# Patient Record
Sex: Female | Born: 1950
Health system: Southern US, Community
[De-identification: ages and names within clinical notes are randomized; demographics above are authoritative.]

## PROBLEM LIST (undated history)

## (undated) DIAGNOSIS — I10 Essential (primary) hypertension: Secondary | ICD-10-CM

## (undated) DIAGNOSIS — E119 Type 2 diabetes mellitus without complications: Secondary | ICD-10-CM

## (undated) HISTORY — PX: TUBAL LIGATION: SHX77

## (undated) HISTORY — PX: ABDOMINAL HYSTERECTOMY: SHX81

---

## 1998-09-29 ENCOUNTER — Ambulatory Visit (HOSPITAL_COMMUNITY): Admission: RE | Admit: 1998-09-29 | Discharge: 1998-09-29 | Payer: Self-pay | Admitting: Neurosurgery

## 1998-09-29 ENCOUNTER — Encounter: Payer: Self-pay | Admitting: Neurosurgery

## 1998-10-13 ENCOUNTER — Ambulatory Visit (HOSPITAL_COMMUNITY): Admission: RE | Admit: 1998-10-13 | Discharge: 1998-10-13 | Payer: Self-pay | Admitting: Neurosurgery

## 1998-10-13 ENCOUNTER — Encounter: Payer: Self-pay | Admitting: Neurosurgery

## 1998-10-27 ENCOUNTER — Encounter: Payer: Self-pay | Admitting: Neurosurgery

## 1998-10-27 ENCOUNTER — Ambulatory Visit (HOSPITAL_COMMUNITY): Admission: RE | Admit: 1998-10-27 | Discharge: 1998-10-27 | Payer: Self-pay | Admitting: Neurosurgery

## 2006-05-24 ENCOUNTER — Ambulatory Visit: Payer: Self-pay | Admitting: Family Medicine

## 2006-08-27 ENCOUNTER — Ambulatory Visit: Payer: Self-pay | Admitting: Family Medicine

## 2010-09-03 ENCOUNTER — Encounter: Payer: Self-pay | Admitting: *Deleted

## 2010-09-03 ENCOUNTER — Encounter: Payer: Self-pay | Admitting: Family Medicine

## 2011-09-17 ENCOUNTER — Ambulatory Visit
Admission: RE | Admit: 2011-09-17 | Discharge: 2011-09-17 | Disposition: A | Discharge status: Home / Self Care | Payer: BC Managed Care – PPO | Source: Ambulatory Visit | Attending: Family Medicine | Admitting: Family Medicine

## 2011-09-17 ENCOUNTER — Other Ambulatory Visit: Payer: Self-pay | Admitting: Family Medicine

## 2011-09-17 DIAGNOSIS — R1031 Right lower quadrant pain: Secondary | ICD-10-CM

## 2011-09-17 MED ORDER — IOHEXOL 300 MG/ML  SOLN
125.0000 mL | Freq: Once | INTRAMUSCULAR | Status: AC | PRN
  Administered 2011-09-17: 125 mL via INTRAVENOUS

## 2013-02-25 ENCOUNTER — Encounter (HOSPITAL_COMMUNITY): Payer: Self-pay | Admitting: *Deleted

## 2013-02-25 ENCOUNTER — Emergency Department (HOSPITAL_COMMUNITY)
Admission: EM | Admit: 2013-02-25 | Discharge: 2013-02-26 | Disposition: A | Discharge status: Home / Self Care | Payer: BC Managed Care – PPO | Attending: Emergency Medicine | Admitting: Emergency Medicine

## 2013-02-25 DIAGNOSIS — IMO0002 Reserved for concepts with insufficient information to code with codable children: Secondary | ICD-10-CM

## 2013-02-25 DIAGNOSIS — M79609 Pain in unspecified limb: Secondary | ICD-10-CM | POA: Insufficient documentation

## 2013-02-25 DIAGNOSIS — I10 Essential (primary) hypertension: Secondary | ICD-10-CM | POA: Insufficient documentation

## 2013-02-25 DIAGNOSIS — Z87891 Personal history of nicotine dependence: Secondary | ICD-10-CM | POA: Insufficient documentation

## 2013-02-25 DIAGNOSIS — E119 Type 2 diabetes mellitus without complications: Secondary | ICD-10-CM | POA: Insufficient documentation

## 2013-02-25 DIAGNOSIS — Z79899 Other long term (current) drug therapy: Secondary | ICD-10-CM | POA: Insufficient documentation

## 2013-02-25 HISTORY — DX: Type 2 diabetes mellitus without complications: E11.9

## 2013-02-25 HISTORY — DX: Essential (primary) hypertension: I10

## 2013-02-25 MED ORDER — OXYCODONE-ACETAMINOPHEN 5-325 MG PO TABS
2.0000 | ORAL_TABLET | Freq: Once | ORAL | Status: AC
  Administered 2013-02-26: 1 via ORAL
  Filled 2013-02-25: qty 2

## 2013-02-25 MED ORDER — ONDANSETRON 8 MG PO TBDP
8.0000 mg | ORAL_TABLET | Freq: Once | ORAL | Status: AC
  Administered 2013-02-26: 8 mg via ORAL
  Filled 2013-02-25: qty 1

## 2013-02-25 NOTE — ED Provider Notes (Signed)
History    This chart was scribed for a non-physician practitioner working with Lyanne Co, MD by Jiles Prows, ED scribe. This patient was seen in room WTR6/WTR6 and the patient's care was started at 11:39 PM.  CSN: [id number] Arrival date & time 02/25/13  2249   Chief Complaint  Patient presents with  . Nail Problem   The history is provided by the patient and medical records. No language interpreter was used.   HPI Comments: Catherine Baker is a 62 y.o. female with a h/o HTN and DM who presents to the Emergency Department complaining of moderate to severe throbbing, shooting pain to great toe nail beds bilaterally onset 2 days ago.  She rated this pain currently at a 10/10.  Pt reports that about 3 weeks ago she had acrylic nails put on her toenails.  She was aware of pain to her great toes bilaterally, so she had the acrylic nails removed yesterday.  The nail salon gave  Her antifungal treatment for home use.  Pt reports there was pus coming out of great toes bilaterally yesterday.  Pt reports severe pain to right great toe today.  She explains a part of the flesh came off with bleeding that is now controlled.  She reports that the left great toe nail was caught on her sock and pulled partially off.  Pt denies headache, diaphoresis, fever, chills, nausea, vomiting, diarrhea, weakness, cough, SOB and any other pain.   Past Medical History  Diagnosis Date  . Hypertension   . Diabetes mellitus without complication    Past Surgical History  Procedure Laterality Date  . Abdominal hysterectomy    . Tubal ligation     No family history on file. History  Substance Use Topics  . Smoking status: Former Games developer  . Smokeless tobacco: Never Used  . Alcohol Use: Yes     Comment: occasional   OB History   Grav Para Term Preterm Abortions TAB SAB Ect Mult Living                 Review of Systems  Constitutional: Negative for fever and chills.  Gastrointestinal: Negative for nausea,  vomiting and abdominal pain.  Skin: Positive for wound.  All other systems reviewed and are negative.    Allergies  Codeine  Home Medications   Current Outpatient Rx  Name  Route  Sig  Dispense  Refill  . amLODipine (NORVASC) 5 MG tablet   Oral   Take 5 mg by mouth daily.         Marland Kitchen b complex vitamins tablet   Oral   Take 1 tablet by mouth daily.         Marland Kitchen levothyroxine (LEVOTHROID) 75 MCG tablet   Oral   Take 75 mcg by mouth daily before breakfast.         . metFORMIN (GLUCOPHAGE) 500 MG tablet   Oral   Take 500 mg by mouth 2 (two) times daily with a meal.         . omega-3 acid ethyl esters (LOVAZA) 1 G capsule   Oral   Take 1 g by mouth daily.         . rosuvastatin (CRESTOR) 10 MG tablet   Oral   Take 10 mg by mouth daily.         Marland Kitchen telmisartan-hydrochlorothiazide (MICARDIS HCT) 80-25 MG per tablet   Oral   Take 1 tablet by mouth daily.         Marland Kitchen  ondansetron (ZOFRAN) 4 MG tablet   Oral   Take 1 tablet (4 mg total) by mouth every 6 (six) hours.   12 tablet   0   . oxyCODONE-acetaminophen (PERCOCET/ROXICET) 5-325 MG per tablet   Oral   Take 1 tablet by mouth every 6 (six) hours as needed for pain. May take 2 tablets PO q 6 hours for severe pain - Do not take with Tylenol as this tablet already contains tylenol   15 tablet   0   . sulfamethoxazole-trimethoprim (SEPTRA DS) 800-160 MG per tablet   Oral   Take 1 tablet by mouth every 12 (twelve) hours.   20 tablet   0    BP 134/69  Pulse 58  Temp(Src) 98.3 F (36.8 C) (Oral)  Resp 20  Ht 5\' 7"  (1.702 m)  Wt 248 lb (112.492 kg)  BMI 38.83 kg/m2  SpO2 100% Physical Exam  Nursing note and vitals reviewed. Constitutional: She is oriented to person, place, and time. She appears well-developed and well-nourished. No distress.  HENT:  Head: Normocephalic and atraumatic.  Right Ear: External ear normal.  Left Ear: External ear normal.  Nose: Nose normal.  Mouth/Throat: Oropharynx is  clear and moist.  Eyes: Conjunctivae are normal.  Neck: Normal range of motion.  Cardiovascular: Normal rate, regular rhythm and normal heart sounds.   Pulmonary/Chest: Effort normal and breath sounds normal. No stridor. No respiratory distress. She has no wheezes. She has no rales.  Abdominal: Soft. She exhibits no distension.  Musculoskeletal: Normal range of motion.  Neurological: She is alert and oriented to person, place, and time. She has normal strength.  Skin: Skin is warm and dry. She is not diaphoretic. No erythema.  Left great toenail very loose.  Clear discharge.  Surrounding erythema.  Toenail on right first toe loose.  1 cm chunk missing from lateral aspect.    Neurovascularly intact.    Psychiatric: She has a normal mood and affect. Her behavior is normal.    ED Course  Procedures (including critical care time) DIAGNOSTIC STUDIES: Oxygen Saturation is 100% on RA, normal by my interpretation.    COORDINATION OF CARE: 11:45 PM - Discussed ED treatment with pt at bedside including percocet for pain management, warm water soaks, antibiotics, follow up with PCP, and great toenail removal and pt agrees.   11:56 PM - Consult with Dr. Patria Mane.  He agreed with plan.  12:10 AM - Will write work note for pt.  12:15 AM - LEFT GREAT TOE NAIL REMOVAL Performed by: Junious Silk, PA-C Authorized by: Lyanne Co, MD Consent: Verbal consent obtained. Consent given by: patient Location: left great toe nail Anesthetic: 2% lidocaine Anesthetic total: 2 mL Technique: digital block Nail Removed: Left great toe nail Post-removal: sterile dressing Patient tolerance: Patient tolerated the procedure well with no immediate complications.  12:38 AM - Evidence of infection under left great toe nail.  Consult with Dr. Patria Mane.  Decided to remove right great toe nail also.  12:41 AM - RIGHT GREAT TOE NAIL REMOVAL Performed by: Junious Silk, PA-C Authorized by: Lyanne Co,  MD Consent: Verbal consent obtained. Consent given by: patient Location: right great toe nail Anesthetic: 2% lidocaine Anesthetic total: 2 mL Technique: digital block Nail Removed: right great Post-removal: sterile dressing Patient tolerance: Patient tolerated the procedure well with no immediate complications.    Labs Reviewed  GLUCOSE, CAPILLARY - Abnormal; Notable for the following:    Glucose-Capillary 106 (*)    All other  components within normal limits   No results found. 1. Infection, nail     MDM  Patient is diabetic with infection of her 1st toes bilaterally. Toenails removed bilaterally. Given rx for bactrim and percocet. Follow up with her PCP and/or podiatrist. Warm water soaks. Discussed importance of taking abx since she is a diabetic. Return instructions given. Vital signs stable for discharge. Dr. Patria Mane evaluated patient and agrees with plan.  Patient / Family / Caregiver informed of clinical course, understand medical decision-making process, and agree with plan.   I personally performed the services described in this documentation, which was scribed in my presence. The recorded information has been reviewed and is accurate.     Mora Bellman, PA-C 02/26/13 [phone]

## 2013-02-25 NOTE — ED Notes (Signed)
Pt reports she had acrylic nails put on both big toes three weeks ago- states she began to notice pus from both nail beds yesterday- left nail is lifting from nail bed

## 2013-02-26 LAB — GLUCOSE, CAPILLARY: Glucose-Capillary: 106 mg/dL — ABNORMAL HIGH (ref 70–99)

## 2013-02-26 MED ORDER — SULFAMETHOXAZOLE-TRIMETHOPRIM 800-160 MG PO TABS: 1.0000 | ORAL_TABLET | Freq: Two times a day (BID) | ORAL | Status: DC

## 2013-02-26 MED ORDER — OXYCODONE-ACETAMINOPHEN 5-325 MG PO TABS: 1.0000 | ORAL_TABLET | Freq: Four times a day (QID) | ORAL | Status: DC | PRN

## 2013-02-26 MED ORDER — ONDANSETRON HCL 4 MG PO TABS: 4.0000 mg | ORAL_TABLET | Freq: Four times a day (QID) | ORAL | Status: DC

## 2013-02-26 NOTE — ED Notes (Signed)
D/c home with ride- rx x 3 given for percocet, zofran and septra

## 2013-02-26 NOTE — ED Notes (Signed)
PA at bedside for toenail removal

## 2013-02-26 NOTE — ED Provider Notes (Signed)
Medical screening examination/treatment/procedure(s) were conducted as a shared visit with non-physician practitioner(s) and myself.  I personally evaluated the patient during the encounter  i was present for portions of the procedures.  Home with antibiotics and PCP followup we have also recommended podiatry followup.  Have recommended that she do warm water mild soap soaks several times a day for the next week.  She understands to return to the ER for new or worsening symptoms  Lyanne Co, MD 02/26/13 [phone]

## 2013-09-07 ENCOUNTER — Other Ambulatory Visit: Payer: Self-pay | Admitting: Family Medicine

## 2013-09-07 ENCOUNTER — Ambulatory Visit
Admission: RE | Admit: 2013-09-07 | Discharge: 2013-09-07 | Disposition: A | Discharge status: Home / Self Care | Payer: BC Managed Care – PPO | Source: Ambulatory Visit | Attending: Family Medicine | Admitting: Family Medicine

## 2013-09-07 DIAGNOSIS — R059 Cough, unspecified: Secondary | ICD-10-CM

## 2013-09-07 DIAGNOSIS — R05 Cough: Secondary | ICD-10-CM

## 2013-09-12 ENCOUNTER — Emergency Department (HOSPITAL_COMMUNITY): Payer: BC Managed Care – PPO

## 2013-09-12 ENCOUNTER — Encounter (HOSPITAL_COMMUNITY): Payer: Self-pay | Admitting: Emergency Medicine

## 2013-09-12 ENCOUNTER — Emergency Department (HOSPITAL_COMMUNITY)
Admission: EM | Admit: 2013-09-12 | Discharge: 2013-09-12 | Disposition: A | Discharge status: Home / Self Care | Payer: BC Managed Care – PPO | Attending: Emergency Medicine | Admitting: Emergency Medicine

## 2013-09-12 DIAGNOSIS — Z79899 Other long term (current) drug therapy: Secondary | ICD-10-CM | POA: Insufficient documentation

## 2013-09-12 DIAGNOSIS — S139XXA Sprain of joints and ligaments of unspecified parts of neck, initial encounter: Secondary | ICD-10-CM | POA: Insufficient documentation

## 2013-09-12 DIAGNOSIS — I10 Essential (primary) hypertension: Secondary | ICD-10-CM | POA: Insufficient documentation

## 2013-09-12 DIAGNOSIS — S161XXA Strain of muscle, fascia and tendon at neck level, initial encounter: Secondary | ICD-10-CM

## 2013-09-12 DIAGNOSIS — Y9389 Activity, other specified: Secondary | ICD-10-CM | POA: Insufficient documentation

## 2013-09-12 DIAGNOSIS — Y92410 Unspecified street and highway as the place of occurrence of the external cause: Secondary | ICD-10-CM | POA: Insufficient documentation

## 2013-09-12 DIAGNOSIS — R110 Nausea: Secondary | ICD-10-CM | POA: Insufficient documentation

## 2013-09-12 DIAGNOSIS — E119 Type 2 diabetes mellitus without complications: Secondary | ICD-10-CM | POA: Insufficient documentation

## 2013-09-12 DIAGNOSIS — Z87891 Personal history of nicotine dependence: Secondary | ICD-10-CM | POA: Insufficient documentation

## 2013-09-12 DIAGNOSIS — S0990XA Unspecified injury of head, initial encounter: Secondary | ICD-10-CM | POA: Insufficient documentation

## 2013-09-12 MED ORDER — ONDANSETRON 4 MG PO TBDP
4.0000 mg | ORAL_TABLET | Freq: Once | ORAL | Status: AC
  Administered 2013-09-12: 4 mg via ORAL
  Filled 2013-09-12: qty 1

## 2013-09-12 MED ORDER — CYCLOBENZAPRINE HCL 10 MG PO TABS
10.0000 mg | ORAL_TABLET | Freq: Once | ORAL | Status: AC
  Administered 2013-09-12: 10 mg via ORAL
  Filled 2013-09-12: qty 1

## 2013-09-12 MED ORDER — ONDANSETRON 4 MG PO TBDP: 4.0000 mg | ORAL_TABLET | Freq: Three times a day (TID) | ORAL | Status: DC | PRN

## 2013-09-12 MED ORDER — KETOROLAC TROMETHAMINE 60 MG/2ML IM SOLN
60.0000 mg | Freq: Once | INTRAMUSCULAR | Status: AC
  Administered 2013-09-12: 60 mg via INTRAMUSCULAR
  Filled 2013-09-12: qty 2

## 2013-09-12 MED ORDER — CYCLOBENZAPRINE HCL 10 MG PO TABS: 10.0000 mg | ORAL_TABLET | Freq: Three times a day (TID) | ORAL | Status: DC | PRN

## 2013-09-12 MED ORDER — OXYCODONE-ACETAMINOPHEN 5-325 MG PO TABS: 1.0000 | ORAL_TABLET | Freq: Four times a day (QID) | ORAL | Status: DC | PRN
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
## 2013-09-12 NOTE — ED Notes (Signed)
Patient returned from Xray and CT. 

## 2013-09-12 NOTE — Discharge Instructions (Signed)
Cervical Sprain °A cervical sprain is an injury in the neck in which the strong, fibrous tissues (ligaments) that connect your neck bones stretch or tear. Cervical sprains can range from mild to severe. Severe cervical sprains can cause the neck vertebrae to be unstable. This can lead to damage of the spinal cord and can result in serious nervous system problems. The amount of time it takes for a cervical sprain to get better depends on the cause and extent of the injury. Most cervical sprains heal in 1 to 3 weeks. °CAUSES  °Severe cervical sprains may be caused by:  °· Contact sport injuries (such as from football, rugby, wrestling, hockey, auto racing, gymnastics, diving, martial arts, or boxing).   °· Motor vehicle collisions.   °· Whiplash injuries. This is an injury from a sudden forward-and backward whipping movement of the head and neck.  °· Falls.   °Mild cervical sprains may be caused by:  °· Being in an awkward position, such as while cradling a telephone between your ear and shoulder.   °· Sitting in a chair that does not offer proper support.   °· Working at a poorly designed computer station.   °· Looking up or down for long periods of time.   °SYMPTOMS  °· Pain, soreness, stiffness, or a burning sensation in the front, back, or sides of the neck. This discomfort may develop immediately after the injury or slowly, 24 hours or more after the injury.   °· Pain or tenderness directly in the middle of the back of the neck.   °· Shoulder or upper back pain.   °· Limited ability to move the neck.   °· Headache.   °· Dizziness.   °· Weakness, numbness, or tingling in the hands or arms.   °· Muscle spasms.   °· Difficulty swallowing or chewing.   °· Tenderness and swelling of the neck.   °DIAGNOSIS  °Most of the time your health care provider can diagnose a cervical sprain by taking your history and doing a physical exam. Your health care provider will ask about previous neck injuries and any known neck  problems, such as arthritis in the neck. X-rays may be taken to find out if there are any other problems, such as with the bones of the neck. Other tests, such as a CT scan or MRI, may also be needed.  °TREATMENT  °Treatment depends on the severity of the cervical sprain. Mild sprains can be treated with rest, keeping the neck in place (immobilization), and pain medicines. Severe cervical sprains are immediately immobilized. Further treatment is done to help with pain, muscle spasms, and other symptoms and may include: °· Medicines, such as pain relievers, numbing medicines, or muscle relaxants.   °· Physical therapy. This may involve stretching exercises, strengthening exercises, and posture training. Exercises and improved posture can help stabilize the neck, strengthen muscles, and help stop symptoms from returning.   °HOME CARE INSTRUCTIONS  °· Put ice on the injured area.   °· Put ice in a plastic bag.   °· Place a towel between your skin and the bag.   °· Leave the ice on for 15 20 minutes, 3 4 times a day.   °· If your injury was severe, you may have been given a cervical collar to wear. A cervical collar is a two-piece collar designed to keep your neck from moving while it heals. °· Do not remove the collar unless instructed by your health care provider. °· If you have long hair, keep it outside of the collar. °· Ask your health care provider before making any adjustments to your collar.   Minor adjustments may be required over time to improve comfort and reduce pressure on your chin or on the back of your head.  Ifyou are allowed to remove the collar for cleaning or bathing, follow your health care provider's instructions on how to do so safely.  Keep your collar clean by wiping it with mild soap and water and drying it completely. If the collar you have been given includes removable pads, remove them every 1 2 days and hand wash them with soap and water. Allow them to air dry. They should be completely  dry before you wear them in the collar.  If you are allowed to remove the collar for cleaning and bathing, wash and dry the skin of your neck. Check your skin for irritation or sores. If you see any, tell your health care provider.  Do not drive while wearing the collar.   Only take over-the-counter or prescription medicines for pain, discomfort, or fever as directed by your health care provider.   Keep all follow-up appointments as directed by your health care provider.   Keep all physical therapy appointments as directed by your health care provider.   Make any needed adjustments to your workstation to promote good posture.   Avoid positions and activities that make your symptoms worse.   Warm up and stretch before being active to help prevent problems.  SEEK MEDICAL CARE IF:   Your pain is not controlled with medicine.   You are unable to decrease your pain medicine over time as planned.   Your activity level is not improving as expected.  SEEK IMMEDIATE MEDICAL CARE IF:   You develop any bleeding.  You develop stomach upset.  You have signs of an allergic reaction to your medicine.   Your symptoms get worse.   You develop new, unexplained symptoms.   You have numbness, tingling, weakness, or paralysis in any part of your body.  MAKE SURE YOU:   Understand these instructions.  Will watch your condition.  Will get help right away if you are not doing well or get worse. Document Released: 05/27/2007 Document Revised: 05/20/2013 Document Reviewed: 02/04/2013 [facility] Patient Information 2014 [place], [place].  Ligament Sprain Ligaments are tough, fibrous tissues that hold bones together at the joints. A sprain can occur when a ligament is stretched. This injury may take several weeks to heal. HOME CARE INSTRUCTIONS   Rest the injured area for as long as directed by your caregiver. Then slowly start using the joint as directed by your caregiver and as  the pain allows.  Keep the affected joint raised if possible to lessen swelling.  Apply ice for 15-20 minutes to the injured area every couple hours for the first half day, then 03-04 times per day for the first 48 hours. Put the ice in a plastic bag and place a towel between the bag of ice and your skin.  Wear any splinting, casting, or elastic bandage applications as instructed.  Only take over-the-counter or prescription medicines for pain, discomfort, or fever as directed by your caregiver. Do not use aspirin immediately after the injury unless instructed by your caregiver. Aspirin can cause increased bleeding and bruising of the tissues.  If you were given crutches, continue to use them as instructed and do not resume weight bearing on the affected extremity until instructed. SEEK MEDICAL CARE IF:   Your bruising, swelling, or pain increases.  You have cold and numb fingers or toes if your arm or leg was injured.  SEEK IMMEDIATE MEDICAL CARE IF:   Your toes are numb or blue if your leg was injured.  Your fingers are numb or blue if your arm was injured.  Your pain is not responding to medicines and continues to stay the same or gets worse. MAKE SURE YOU:   Understand these instructions.  Will watch your condition.  Will get help right away if you are not doing well or get worse. Document Released: 07/27/2000 Document Revised: 10/22/2011 Document Reviewed: 05/25/2008 [facility] Patient Information 2014 [place], [place].  Muscle Strain A muscle strain is an injury that occurs when a muscle is stretched beyond its normal length. Usually a small number of muscle fibers are torn when this happens. Muscle strain is rated in degrees. First-degree strains have the least amount of muscle fiber tearing and pain. Second-degree and third-degree strains have increasingly more tearing and pain.  Usually, recovery from muscle strain takes 1 2 weeks. Complete healing takes 5 6 weeks.  CAUSES    Muscle strain happens when a sudden, violent force placed on a muscle stretches it too far. This may occur with lifting, sports, or a fall.  RISK FACTORS Muscle strain is especially common in athletes.  SIGNS AND SYMPTOMS At the site of the muscle strain, there may be:  Pain.  Bruising.  Swelling.  Difficulty using the muscle due to pain or lack of normal function. DIAGNOSIS  Your health care provider will perform a physical exam and ask about your medical history. TREATMENT  Often, the best treatment for a muscle strain is resting, icing, and applying cold compresses to the injured area.  HOME CARE INSTRUCTIONS   Use the PRICE method of treatment to promote muscle healing during the first 2 3 days after your injury. The PRICE method involves:  Protecting the muscle from being injured again.  Restricting your activity and resting the injured body part.  Icing your injury. To do this, put ice in a plastic bag. Place a towel between your skin and the bag. Then, apply the ice and leave it on from 15 20 minutes each hour. After the third day, switch to moist heat packs.  Apply compression to the injured area with a splint or elastic bandage. Be careful not to wrap it too tightly. This may interfere with blood circulation or increase swelling.  Elevate the injured body part above the level of your heart as often as you can.  Only take over-the-counter or prescription medicines for pain, discomfort, or fever as directed by your health care provider.  Warming up prior to exercise helps to prevent future muscle strains. SEEK MEDICAL CARE IF:   You have increasing pain or swelling in the injured area.  You have numbness, tingling, or a significant loss of strength in the injured area. MAKE SURE YOU:   Understand these instructions.  Will watch your condition.  Will get help right away if you are not doing well or get worse. Document Released: 07/30/2005 Document Revised:  05/20/2013 Document Reviewed: 02/26/2013 [facility] Patient Information 2014 [place], [place].

## 2013-09-12 NOTE — ED Provider Notes (Signed)
Medical screening examination/treatment/procedure(s) were performed by non-physician practitioner and as supervising physician I was immediately available for consultation/collaboration.  EKG Interpretation   None         Ladaysha Soutar M Senovia Gauer, MD 09/12/13 [id number] 

## 2013-10-22 ENCOUNTER — Other Ambulatory Visit: Payer: Self-pay | Admitting: Family Medicine

## 2013-10-22 ENCOUNTER — Ambulatory Visit
Admission: RE | Admit: 2013-10-22 | Discharge: 2013-10-22 | Disposition: A | Discharge status: Home / Self Care | Payer: BC Managed Care – PPO | Source: Ambulatory Visit | Attending: Family Medicine | Admitting: Family Medicine

## 2013-10-22 DIAGNOSIS — M25562 Pain in left knee: Secondary | ICD-10-CM

## 2013-12-24 ENCOUNTER — Ambulatory Visit (HOSPITAL_COMMUNITY)
Admission: RE | Admit: 2013-12-24 | Discharge: 2013-12-24 | Disposition: A | Discharge status: Home / Self Care | Payer: Self-pay | Source: Ambulatory Visit | Attending: Orthopedic Surgery | Admitting: Orthopedic Surgery

## 2013-12-24 ENCOUNTER — Other Ambulatory Visit (HOSPITAL_COMMUNITY): Payer: Self-pay | Admitting: Orthopedic Surgery

## 2013-12-24 DIAGNOSIS — M7989 Other specified soft tissue disorders: Secondary | ICD-10-CM

## 2013-12-24 DIAGNOSIS — M25569 Pain in unspecified knee: Secondary | ICD-10-CM

## 2013-12-24 DIAGNOSIS — M79609 Pain in unspecified limb: Secondary | ICD-10-CM | POA: Insufficient documentation

## 2013-12-24 NOTE — Progress Notes (Signed)
*  Preliminary Results* Bilateral lower extremity venous duplex completed. Bilateral lower extremities are negative for deep vein thrombosis. There is no evidence of Baker's cyst bilaterally.  Preliminary results discussed with Dr.Graves.  12/24/2013  Gertie FeyMichelle Noriah Osgood, RVT, RDCS, RDMS

## 2014-08-10 ENCOUNTER — Telehealth (HOSPITAL_COMMUNITY): Payer: Self-pay | Admitting: *Deleted

## 2014-08-10 ENCOUNTER — Emergency Department (INDEPENDENT_AMBULATORY_CARE_PROVIDER_SITE_OTHER)
Admission: EM | Admit: 2014-08-10 | Discharge: 2014-08-10 | Disposition: A | Discharge status: Home / Self Care | Payer: BC Managed Care – PPO | Source: Home / Self Care | Attending: Family Medicine | Admitting: Family Medicine

## 2014-08-10 ENCOUNTER — Encounter (HOSPITAL_COMMUNITY): Payer: Self-pay | Admitting: Emergency Medicine

## 2014-08-10 ENCOUNTER — Ambulatory Visit (HOSPITAL_COMMUNITY): Payer: BC Managed Care – PPO | Attending: Physician Assistant

## 2014-08-10 DIAGNOSIS — R079 Chest pain, unspecified: Secondary | ICD-10-CM | POA: Diagnosis present

## 2014-08-10 DIAGNOSIS — R509 Fever, unspecified: Secondary | ICD-10-CM | POA: Diagnosis present

## 2014-08-10 DIAGNOSIS — R059 Cough, unspecified: Secondary | ICD-10-CM

## 2014-08-10 DIAGNOSIS — J320 Chronic maxillary sinusitis: Secondary | ICD-10-CM

## 2014-08-10 DIAGNOSIS — R05 Cough: Secondary | ICD-10-CM

## 2014-08-10 LAB — POCT RAPID STREP A: Streptococcus, Group A Screen (Direct): NEGATIVE

## 2014-08-10 MED ORDER — AMOXICILLIN-POT CLAVULANATE ER 1000-62.5 MG PO TB12: 2.0000 | ORAL_TABLET | Freq: Two times a day (BID) | ORAL | Status: DC

## 2014-08-10 MED ORDER — IPRATROPIUM BROMIDE 0.06 % NA SOLN: 2.0000 | Freq: Four times a day (QID) | NASAL | Status: DC

## 2014-08-10 NOTE — Discharge Instructions (Signed)
Cough, Adult  A cough is a reflex that helps clear your throat and airways. It can help heal the body or may be a reaction to an irritated airway. A cough may only last 2 or 3 weeks (acute) or may last more than 8 weeks (chronic).  CAUSES Acute cough:  Viral or bacterial infections. Chronic cough:  Infections.  Allergies.  Asthma.  Post-nasal drip.  Smoking.  Heartburn or acid reflux.  Some medicines.  Chronic lung problems (COPD).  Cancer. SYMPTOMS   Cough.  Fever.  Chest pain.  Increased breathing rate.  High-pitched whistling sound when breathing (wheezing).  Colored mucus that you cough up (sputum). TREATMENT   A bacterial cough may be treated with antibiotic medicine.  A viral cough must run its course and will not respond to antibiotics.  Your caregiver may recommend other treatments if you have a chronic cough. HOME CARE INSTRUCTIONS   Only take over-the-counter or prescription medicines for pain, discomfort, or fever as directed by your caregiver. Use cough suppressants only as directed by your caregiver.  Use a cold steam vaporizer or humidifier in your bedroom or home to help loosen secretions.  Sleep in a semi-upright position if your cough is worse at night.  Rest as needed.  Stop smoking if you smoke. SEEK IMMEDIATE MEDICAL CARE IF:   You have pus in your sputum.  Your cough starts to worsen.  You cannot control your cough with suppressants and are losing sleep.  You begin coughing up blood.  You have difficulty breathing.  You develop pain which is getting worse or is uncontrolled with medicine.  You have a fever. MAKE SURE YOU:   Understand these instructions.  Will watch your condition.  Will get help right away if you are not doing well or get worse. Document Released: 01/26/2011 Document Revised: 10/22/2011 Document Reviewed: 01/26/2011 [facility] Patient Information 2015 [place], [place]. This information is not intended  to replace advice given to you by your health care provider. Make sure you discuss any questions you have with your health care provider.  Fever, Adult A fever is a higher than normal body temperature. In an adult, an oral temperature around 98.6 F (37 C) is considered normal. A temperature of 100.4 F (38 C) or higher is generally considered a fever. Mild or moderate fevers generally have no long-term effects and often do not require treatment. Extreme fever (greater than or equal to 106 F or 41.1 C) can cause seizures. The sweating that may occur with repeated or prolonged fever may cause dehydration. Elderly people can develop confusion during a fever. A measured temperature can vary with:  Age.  Time of day.  Method of measurement (mouth, underarm, rectal, or ear). The fever is confirmed by taking a temperature with a thermometer. Temperatures can be taken different ways. Some methods are accurate and some are not.  An oral temperature is used most commonly. Electronic thermometers are fast and accurate.  An ear temperature will only be accurate if the thermometer is positioned as recommended by the manufacturer.  A rectal temperature is accurate and done for those adults who have a condition where an oral temperature cannot be taken.  An underarm (axillary) temperature is not accurate and not recommended. Fever is a symptom, not a disease.  CAUSES   Infections commonly cause fever.  Some noninfectious causes for fever include:  Some arthritis conditions.  Some thyroid or adrenal gland conditions.  Some immune system conditions.  Some types of cancer.  A medicine reaction.  High doses of certain street drugs such as methamphetamine.  Dehydration.  Exposure to high outside or room temperatures.  Occasionally, the source of a fever cannot be determined. This is sometimes called a "fever of unknown origin" (FUO).  Some situations may lead to a temporary rise in body  temperature that may go away on its own. Examples are:  Childbirth.  Surgery.  Intense exercise. HOME CARE INSTRUCTIONS   Take appropriate medicines for fever. Follow dosing instructions carefully. If you use acetaminophen to reduce the fever, be careful to avoid taking other medicines that also contain acetaminophen. Do not take aspirin for a fever if you are younger than age 61. There is an association with Reye's syndrome. Reye's syndrome is a rare but potentially deadly disease.  If an infection is present and antibiotics have been prescribed, take them as directed. Finish them even if you start to feel better.  Rest as needed.  Maintain an adequate fluid intake. To prevent dehydration during an illness with prolonged or recurrent fever, you may need to drink extra fluid.Drink enough fluids to keep your urine clear or pale yellow.  Sponging or bathing with room temperature water may help reduce body temperature. Do not use ice water or alcohol sponge baths.  Dress comfortably, but do not over-bundle. SEEK MEDICAL CARE IF:   You are unable to keep fluids down.  You develop vomiting or diarrhea.  You are not feeling at least partly better after 3 days.  You develop new symptoms or problems. SEEK IMMEDIATE MEDICAL CARE IF:   You have shortness of breath or trouble breathing.  You develop excessive weakness.  You are dizzy or you faint.  You are extremely thirsty or you are making little or no urine.  You develop new pain that was not there before (such as in the head, neck, chest, back, or abdomen).  You have persistent vomiting and diarrhea for more than 1 to 2 days.  You develop a stiff neck or your eyes become sensitive to light.  You develop a skin rash.  You have a fever or persistent symptoms for more than 2 to 3 days.  You have a fever and your symptoms suddenly get worse. MAKE SURE YOU:   Understand these instructions.  Will watch your  condition.  Will get help right away if you are not doing well or get worse. Document Released: 01/23/2001 Document Revised: 12/14/2013 Document Reviewed: 05/31/2011 [facility]Exit[facility] Patient Information 2015 [place], [place]. This information is not intended to replace advice given to you by your health care provider. Make sure you discuss any questions you have with your health care provider.  Sinusitis Sinusitis is redness, soreness, and inflammation of the paranasal sinuses. Paranasal sinuses are air pockets within the bones of your face (beneath the eyes, the middle of the forehead, or above the eyes). In healthy paranasal sinuses, mucus is able to drain out, and air is able to circulate through them by way of your nose. However, when your paranasal sinuses are inflamed, mucus and air can become trapped. This can allow bacteria and other germs to grow and cause infection. Sinusitis can develop quickly and last only a short time (acute) or continue over a long period (chronic). Sinusitis that lasts for more than 12 weeks is considered chronic.  CAUSES  Causes of sinusitis include:  Allergies.  Structural abnormalities, such as displacement of the cartilage that separates your nostrils (deviated septum), which can decrease the air flow through your  nose and sinuses and affect sinus drainage.  Functional abnormalities, such as when the small hairs (cilia) that line your sinuses and help remove mucus do not work properly or are not present. SIGNS AND SYMPTOMS  Symptoms of acute and chronic sinusitis are the same. The primary symptoms are pain and pressure around the affected sinuses. Other symptoms include:  Upper toothache.  Earache.  Headache.  Bad breath.  Decreased sense of smell and taste.  A cough, which worsens when you are lying flat.  Fatigue.  Fever.  Thick drainage from your nose, which often is green and may contain pus (purulent).  Swelling and warmth over the affected  sinuses. DIAGNOSIS  Your health care provider will perform a physical exam. During the exam, your health care provider may:  Look in your nose for signs of abnormal growths in your nostrils (nasal polyps).  Tap over the affected sinus to check for signs of infection.  View the inside of your sinuses (endoscopy) using an imaging device that has a light attached (endoscope). If your health care provider suspects that you have chronic sinusitis, one or more of the following tests may be recommended:  Allergy tests.  Nasal culture. A sample of mucus is taken from your nose, sent to a lab, and screened for bacteria.  Nasal cytology. A sample of mucus is taken from your nose and examined by your health care provider to determine if your sinusitis is related to an allergy. TREATMENT  Most cases of acute sinusitis are related to a viral infection and will resolve on their own within 10 days. Sometimes medicines are prescribed to help relieve symptoms (pain medicine, decongestants, nasal steroid sprays, or saline sprays).  However, for sinusitis related to a bacterial infection, your health care provider will prescribe antibiotic medicines. These are medicines that will help kill the bacteria causing the infection.  Rarely, sinusitis is caused by a fungal infection. In theses cases, your health care provider will prescribe antifungal medicine. For some cases of chronic sinusitis, surgery is needed. Generally, these are cases in which sinusitis recurs more than 3 times per year, despite other treatments. HOME CARE INSTRUCTIONS   Drink plenty of water. Water helps thin the mucus so your sinuses can drain more easily.  Use a humidifier.  Inhale steam 3 to 4 times a day (for example, sit in the bathroom with the shower running).  Apply a warm, moist washcloth to your face 3 to 4 times a day, or as directed by your health care provider.  Use saline nasal sprays to help moisten and clean your  sinuses.  Take medicines only as directed by your health care provider.  If you were prescribed either an antibiotic or antifungal medicine, finish it all even if you start to feel better. SEEK IMMEDIATE MEDICAL CARE IF:  You have increasing pain or severe headaches.  You have nausea, vomiting, or drowsiness.  You have swelling around your face.  You have vision problems.  You have a stiff neck.  You have difficulty breathing. MAKE SURE YOU:   Understand these instructions.  Will watch your condition.  Will get help right away if you are not doing well or get worse. Document Released: 07/30/2005 Document Revised: 12/14/2013 Document Reviewed: 08/14/2011 [facility] Patient Information 2015 [place], [place]. This information is not intended to replace advice given to you by your health care provider. Make sure you discuss any questions you have with your health care provider.

## 2014-08-10 NOTE — ED Notes (Signed)
C/o flu symptoms onset Wednesday 12/23

## 2014-08-10 NOTE — ED Notes (Signed)
[facility].

## 2014-08-10 NOTE — ED Provider Notes (Signed)
CSN: [id number]     Arrival date & time 08/10/14  1436 History   First MD Initiated Contact with Patient 08/10/14 1450     No chief complaint on file.  (Consider location/radiation/quality/duration/timing/severity/associated sxs/prior Treatment) HPI Comments: PCP: Dr. Leeroy Bock. Reade Works as Charity fundraiserN at [employer] Nonsmoker   Patient is a 63 y.o. female presenting with URI. The history is provided by the patient.  URI Presenting symptoms: congestion, cough, facial pain, fatigue, fever, rhinorrhea and sore throat   Onset quality:  Gradual Duration:  6 days Timing:  Constant Progression:  Worsening Chronicity:  New Associated symptoms: headaches, myalgias and sinus pain   Associated symptoms: no neck pain, no sneezing, no swollen glands and no wheezing   Risk factors: diabetes mellitus     Past Medical History  Diagnosis Date  . Hypertension   . Diabetes mellitus without complication    Past Surgical History  Procedure Laterality Date  . Abdominal hysterectomy    . Tubal ligation     No family history on file. History  Substance Use Topics  . Smoking status: Former Games developermoker  . Smokeless tobacco: Never Used  . Alcohol Use: Yes     Comment: occasional   OB History    No data available     Review of Systems  Constitutional: Positive for fever and fatigue.  HENT: Positive for congestion, rhinorrhea and sore throat. Negative for facial swelling, hearing loss, mouth sores, postnasal drip and sneezing.   Eyes: Negative.   Respiratory: Positive for cough. Negative for chest tightness, shortness of breath and wheezing.   Cardiovascular: Negative.   Gastrointestinal: Negative.   Genitourinary: Negative.   Musculoskeletal: Positive for myalgias and back pain. Negative for neck pain.  Skin: Negative.   Neurological: Positive for headaches.    Allergies  Codeine  Home Medications   Prior to Admission medications   Medication Sig Start Date End Date Taking? Authorizing Provider   telmisartan-hydrochlorothiazide (MICARDIS HCT) 80-25 MG per tablet Take 1 tablet by mouth daily.   Yes Historical Provider, MD  amLODipine (NORVASC) 5 MG tablet Take 5 mg by mouth daily.    Historical Provider, MD  amoxicillin-clavulanate (AUGMENTIN XR) 1000-62.5 MG per tablet Take 2 tablets by mouth 2 (two) times daily. X 7 days 08/10/14   Mathis FareJennifer Lee H Monique Hefty, PA  b complex vitamins tablet Take 1 tablet by mouth daily.    Historical Provider, MD  cyclobenzaprine (FLEXERIL) 10 MG tablet Take 1 tablet (10 mg total) by mouth 3 (three) times daily as needed for muscle spasms. 09/12/13   Izola PriceFrances C. Sanford, PA-C  levothyroxine (LEVOTHROID) 75 MCG tablet Take 75 mcg by mouth daily before breakfast.    Historical Provider, MD  metFORMIN (GLUCOPHAGE) 500 MG tablet Take 500 mg by mouth 2 (two) times daily with a meal.    Historical Provider, MD  omega-3 acid ethyl esters (LOVAZA) 1 G capsule Take 1 g by mouth daily.    Historical Provider, MD  ondansetron (ZOFRAN) 4 MG tablet Take 1 tablet (4 mg total) by mouth every 6 (six) hours. 02/26/13   Mora BellmanHannah S Merrell, PA-C  ondansetron (ZOFRAN-ODT) 4 MG disintegrating tablet Take 1 tablet (4 mg total) by mouth every 8 (eight) hours as needed for nausea or vomiting. 09/12/13   Izola PriceFrances C. Sanford, PA-C  oxyCODONE-acetaminophen (PERCOCET/ROXICET) 5-325 MG per tablet Take 1 tablet by mouth every 6 (six) hours as needed. May take 2 tablets PO q 6 hours for severe pain - Do not take  with Tylenol as this tablet already contains tylenol 09/12/13   Izola PriceFrances C. Sanford, PA-C  rosuvastatin (CRESTOR) 10 MG tablet Take 10 mg by mouth daily.    Historical Provider, MD  sulfamethoxazole-trimethoprim (SEPTRA DS) 800-160 MG per tablet Take 1 tablet by mouth every 12 (twelve) hours. 02/26/13   Ramon DredgeHannah S Merrell, PA-C   BP 151/74 mmHg  Pulse 81  Temp(Src) 100.7 F (38.2 C) (Oral)  Resp 16  SpO2 98% Physical Exam  Constitutional: She is oriented to person, place, and time. She  appears well-developed and well-nourished. No distress.  HENT:  Head: Normocephalic and atraumatic.  Right Ear: Hearing, tympanic membrane, external ear and ear canal normal.  Left Ear: Hearing, tympanic membrane, external ear and ear canal normal.  Nose: Nose normal. Right sinus exhibits no maxillary sinus tenderness and no frontal sinus tenderness.  Mouth/Throat: Uvula is midline and mucous membranes are normal. Posterior oropharyngeal erythema present. No oropharyngeal exudate, posterior oropharyngeal edema or tonsillar abscesses.  Eyes: Conjunctivae and EOM are normal. Pupils are equal, round, and reactive to light. Right eye exhibits no discharge. Left eye exhibits no discharge. No scleral icterus.  Neck: Normal range of motion. Neck supple.  Cardiovascular: Normal rate, regular rhythm and normal heart sounds.   Pulmonary/Chest: Effort normal and breath sounds normal. No respiratory distress. She has no wheezes.  Abdominal: Soft. Bowel sounds are normal. She exhibits no distension. There is no tenderness.  Musculoskeletal: Normal range of motion.  Neurological: She is alert and oriented to person, place, and time.  Skin: Skin is warm and dry.  Psychiatric: She has a normal mood and affect. Her behavior is normal.  Nursing note and vitals reviewed.   ED Course  Procedures (including critical care time) Labs Review Labs Reviewed  POCT RAPID STREP A ([facility] CARE ONLY)    Imaging Review Dg Chest 2 View  08/10/2014   CLINICAL DATA:  Productive cough for 7 days. Fever for 6 days. Left chest pain.  EXAM: CHEST  2 VIEW  COMPARISON:  09/07/2013  FINDINGS: The heart size and mediastinal contours are within normal limits. Both lungs are clear. The visualized skeletal structures are unremarkable.  IMPRESSION: No active cardiopulmonary disease.   Electronically Signed   By: Charlett NoseKevin  Dover M.D.   On: 08/10/2014 16:08     MDM   1. Left maxillary sinusitis   2. Cough   3. Fever   CXR  unremarkable Exam and vital signs suggestive of left maxillary and left frontal sinusitis. Will treat with Atrovent Nasal spray and Augmentin x 7 days with PCP follow up if no improvement.     Ria ClockJennifer Lee H Lanore Renderos, [place] 08/10/14 [phone]

## 2014-08-10 NOTE — ED Notes (Signed)
[facility] pharmacy called and said Rx. Augmentin 1000 mg. XR is written 2 BID x 7 days # 14.  Discussed with Dr. Artis FlockKindl. He said it should be 1 BID x 7 days.  Pharmacist notified. Vassie MoselleYork, Japheth Diekman M 08/10/2014

## 2014-08-12 LAB — CULTURE, GROUP A STREP

## 2014-09-21 ENCOUNTER — Ambulatory Visit: Payer: BLUE CROSS/BLUE SHIELD | Admitting: Internal Medicine

## 2014-10-21 ENCOUNTER — Ambulatory Visit: Payer: Self-pay | Admitting: Internal Medicine

## 2014-10-22 ENCOUNTER — Encounter: Payer: Self-pay | Admitting: Family

## 2014-10-22 ENCOUNTER — Ambulatory Visit (INDEPENDENT_AMBULATORY_CARE_PROVIDER_SITE_OTHER): Payer: BLUE CROSS/BLUE SHIELD | Admitting: Family

## 2014-10-22 VITALS — BP 152/86 | HR 54 | Temp 98.1°F | Resp 18 | Ht 67.0 in | Wt 248.0 lb

## 2014-10-22 DIAGNOSIS — E039 Hypothyroidism, unspecified: Secondary | ICD-10-CM | POA: Diagnosis not present

## 2014-10-22 DIAGNOSIS — G4700 Insomnia, unspecified: Secondary | ICD-10-CM | POA: Diagnosis not present

## 2014-10-22 DIAGNOSIS — I10 Essential (primary) hypertension: Secondary | ICD-10-CM

## 2014-10-22 DIAGNOSIS — E119 Type 2 diabetes mellitus without complications: Secondary | ICD-10-CM | POA: Diagnosis not present

## 2014-10-22 MED ORDER — LEVOTHYROXINE SODIUM 75 MCG PO TABS: 75.0000 ug | ORAL_TABLET | Freq: Every day | ORAL | Status: DC

## 2014-10-22 MED ORDER — FUROSEMIDE 20 MG PO TABS: 20.0000 mg | ORAL_TABLET | ORAL | Status: DC | PRN

## 2014-10-22 MED ORDER — TELMISARTAN-HCTZ 80-25 MG PO TABS: 1.0000 | ORAL_TABLET | Freq: Every day | ORAL | Status: DC

## 2014-10-22 MED ORDER — ZOLPIDEM TARTRATE ER 12.5 MG PO TBCR: 12.5000 mg | EXTENDED_RELEASE_TABLET | Freq: Every evening | ORAL | Status: DC | PRN

## 2014-10-22 MED ORDER — METFORMIN HCL 500 MG PO TABS: 500.0000 mg | ORAL_TABLET | Freq: Two times a day (BID) | ORAL | Status: DC

## 2014-10-22 NOTE — Assessment & Plan Note (Signed)
Stable with current dosage of levothyroxine. Obtain TSH to determine current status. Refill levothyroxine pending lab results.

## 2014-10-22 NOTE — Assessment & Plan Note (Signed)
Patient indicates home blood sugars around 120. Appears stable with current dosage of metformin. Refill metformin. Obtain basic metabolic panel and A1c. Changes pending lab work. Patient is overdue for eye exam. She will schedule an eye exam. We will perform diabetic foot exam and urine microalbumin next visit.

## 2014-10-22 NOTE — Patient Instructions (Signed)
Thank you for choosing [facility].  Summary/Instructions:  Please schedule a time for your physical.   Your prescription(s) have been submitted to your pharmacy or been printed and provided for you. Please take as directed and contact our office if you believe you are having problem(s) with the medication(s) or have any questions.  Please stop by the lab on the basement level of the building for your blood work. Your results will be released to MyChart (or called to you) after review, usually within 72 hours after test completion. If any changes need to be made, you will be notified at that same time.  If your symptoms worsen or fail to improve, please contact our office for further instruction, or in case of emergency go directly to the emergency room at the closest medical facility.     

## 2014-10-22 NOTE — Assessment & Plan Note (Signed)
This is a chronic problem that has been managed with Ambien CR. Patient indicates that she does sleep fairly well with Ambien and does not have any side effects. Refill Ambien CR.

## 2014-10-22 NOTE — Progress Notes (Signed)
Pre visit review using our clinic review tool, if applicable. No additional management support is needed unless otherwise documented below in the visit note. 

## 2014-10-22 NOTE — Progress Notes (Signed)
Subjective:    Patient ID: Catherine Baker, female    DOB: 21-Aug-1950, 64 y.o.   MRN: [id number]   Chief Complaint  Patient presents with  . Establish Care    needs medication refills for everything, not able to sleep good     HPI:  Catherine Baker is a 64 y.o. female who presents today to establish care and discuss her medications.  1) Hypertension - Currently maintained on  telmisartain-HCTZ. Notes she has not had her medication for about 3 weeks.   BP Readings from Last 3 Encounters:  10/22/14 152/86  08/10/14 151/74  09/12/13 140/72     2) Sleep - Associated symptoms of insomnia has been going on for years for which she has treated with Ambien CR which has helped her get through the night of sleep. Indicates that she does not need the medication every night it is really schedule dependent. Has been taking Ambien for years. Indicates that she has trouble both falling asleep and staying asleep at times. States she feels daytime fatigue and lack of sleep. Has been without her medication for a couple of months now. Has tried other OTC medications.   3) Diabetes - Currently maintained on metformin. Notes last A1c was done in December. Last eye exam was a couple of years ago.   4) Hypothyroidism - Stable with current regimen of 75 mcg of levothyroxine.    Allergies  Allergen Reactions  . Codeine Hives and Nausea Only    Current Outpatient Prescriptions on File Prior to Visit  Medication Sig Dispense Refill  . b complex vitamins tablet Take 1 tablet by mouth daily.    Marland Kitchen levothyroxine (LEVOTHROID) 75 MCG tablet Take 75 mcg by mouth daily before breakfast.    . metFORMIN (GLUCOPHAGE) 500 MG tablet Take 500 mg by mouth 2 (two) times daily with a meal.    . omega-3 acid ethyl esters (LOVAZA) 1 G capsule Take 1 g by mouth daily.    Marland Kitchen telmisartan-hydrochlorothiazide (MICARDIS HCT) 80-25 MG per tablet Take 1 tablet by mouth daily.     No current facility-administered medications  on file prior to visit.    Past Medical History  Diagnosis Date  . Hypertension   . Diabetes mellitus without complication     Past Surgical History  Procedure Laterality Date  . Abdominal hysterectomy    . Tubal ligation      Family History  Problem Relation Age of Onset  . Hypertension Mother   . Heart disease Mother   . Hyperlipidemia Mother   . Hypertension Father   . Heart disease Father   . Hyperlipidemia Father   . Breast cancer Maternal Grandmother     History   Social History  . Marital Status: Legally Separated    Spouse Name: N/A  . Number of Children: 3  . Years of Education: 14   Occupational History  . Nurse    Social History Main Topics  . Smoking status: Former Games developer  . Smokeless tobacco: Never Used  . Alcohol Use: Yes     Comment: occasional  . Drug Use: No  . Sexual Activity: Not on file   Other Topics Concern  . Not on file   Social History Narrative   Fun: Dance, read, walk, music.    Denies any religious beliefs effecting healthcare.      Review of Systems  Eyes:       Negative for changes in vision.   Respiratory: Negative for chest tightness.  Cardiovascular: Negative for chest pain, palpitations and leg swelling.  Psychiatric/Behavioral: Positive for sleep disturbance.      Objective:    BP 152/86 mmHg  Pulse 54  Temp(Src) 98.1 F (36.7 C) (Oral)  Resp 18  Ht 5\' 7"  (1.702 m)  Wt 248 lb (112.492 kg)  BMI 38.83 kg/m2  SpO2 98% Nursing note and vital signs reviewed.  Physical Exam  Constitutional: She is oriented to person, place, and time. She appears well-developed and well-nourished. No distress.  Cardiovascular: Normal rate, regular rhythm, normal heart sounds and intact distal pulses.   Pulmonary/Chest: Effort normal and breath sounds normal.  Neurological: She is alert and oriented to person, place, and time.  Skin: Skin is warm and dry.  Psychiatric: She has a normal mood and affect. Her behavior is normal.  Judgment and thought content normal.       Assessment & Plan:

## 2014-10-22 NOTE — Assessment & Plan Note (Signed)
Blood pressure slightly elevated today. Patient is she has not taken her medication 3 weeks secondary to not having them. Refill telmisartan-HCTZ for blood pressure. Refill furosemide as needed for edema. Patient is overdue for eye exam. Indicates she will schedule.

## 2014-10-25 ENCOUNTER — Telehealth: Payer: Self-pay | Admitting: Family

## 2014-10-25 NOTE — Telephone Encounter (Signed)
emmi mailed  °

## 2014-11-26 ENCOUNTER — Other Ambulatory Visit: Payer: Self-pay | Admitting: Family

## 2014-11-29 ENCOUNTER — Other Ambulatory Visit (INDEPENDENT_AMBULATORY_CARE_PROVIDER_SITE_OTHER): Payer: BLUE CROSS/BLUE SHIELD

## 2014-11-29 ENCOUNTER — Other Ambulatory Visit: Payer: Self-pay | Admitting: Family

## 2014-11-29 DIAGNOSIS — E119 Type 2 diabetes mellitus without complications: Secondary | ICD-10-CM

## 2014-11-29 DIAGNOSIS — I10 Essential (primary) hypertension: Secondary | ICD-10-CM | POA: Diagnosis not present

## 2014-11-29 DIAGNOSIS — E039 Hypothyroidism, unspecified: Secondary | ICD-10-CM

## 2014-11-29 LAB — BASIC METABOLIC PANEL
BUN: 20 mg/dL (ref 6–23)
CHLORIDE: 103 meq/L (ref 96–112)
CO2: 32 mEq/L (ref 19–32)
Calcium: 9.7 mg/dL (ref 8.4–10.5)
Creatinine, Ser: 0.82 mg/dL (ref 0.40–1.20)
GFR: 90.82 mL/min (ref >60.00)
Glucose, Bld: 95 mg/dL (ref 70–99)
POTASSIUM: 3.9 meq/L (ref 3.5–5.1)
Sodium: 138 mEq/L (ref 135–145)

## 2014-11-29 LAB — TSH: TSH: 2.71 u[IU]/mL (ref 0.35–4.50)

## 2014-11-29 LAB — HEMOGLOBIN A1C: Hgb A1c MFr Bld: 5.8 % (ref 4.6–6.5)

## 2014-11-29 NOTE — Telephone Encounter (Signed)
Ok to fill 

## 2014-11-30 ENCOUNTER — Telehealth: Payer: Self-pay | Admitting: Family

## 2014-11-30 NOTE — Telephone Encounter (Signed)
Please inform the patient that her A1c was 5.8 indicating that her diabetes is well controlled.. Her thyroid, kidney function and electrolytes are also within normal ranges. Therefore we will continue her levothyroxine at the current dosage. Is she would like a trial of stopping the metformin for 3 months we can do that as well.

## 2014-12-01 NOTE — Telephone Encounter (Signed)
Pt aware of her results 

## 2015-01-11 ENCOUNTER — Telehealth: Payer: Self-pay | Admitting: Family

## 2015-01-11 MED ORDER — ZOLPIDEM TARTRATE ER 12.5 MG PO TBCR: 12.5000 mg | EXTENDED_RELEASE_TABLET | Freq: Every evening | ORAL | Status: DC | PRN

## 2015-01-11 NOTE — Telephone Encounter (Signed)
[facility] is [facility] [address]

## 2015-01-11 NOTE — Telephone Encounter (Signed)
Refill printed and faxed. 

## 2015-02-08 ENCOUNTER — Telehealth: Payer: Self-pay | Admitting: Family

## 2015-02-08 MED ORDER — LEVOTHYROXINE SODIUM 75 MCG PO TABS: 75.0000 ug | ORAL_TABLET | Freq: Every day | ORAL | Status: DC

## 2015-02-08 MED ORDER — METFORMIN HCL 500 MG PO TABS: 500.0000 mg | ORAL_TABLET | Freq: Two times a day (BID) | ORAL | Status: DC

## 2015-02-08 MED ORDER — FUROSEMIDE 20 MG PO TABS: 20.0000 mg | ORAL_TABLET | ORAL | Status: DC | PRN

## 2015-02-08 MED ORDER — TELMISARTAN-HCTZ 80-25 MG PO TABS: 1.0000 | ORAL_TABLET | Freq: Every day | ORAL | Status: DC

## 2015-02-08 NOTE — Telephone Encounter (Signed)
erx done on all except for the zolpidem.   Please advise if this is okay to fill?

## 2015-02-08 NOTE — Telephone Encounter (Signed)
Patient requesting refills for furosemide (LASIX) 20 MG tablet [[id number], levothyroxine (LEVOTHROID) 75 MCG tablet [[id number]][[id number]], metFORMIN (GLUCOPHAGE) 500 MG tablet [[id number]][[id number]], telmisartan-hydrochlorothiazide (MICARDIS HCT) 80-25 MG per tablet [[id number]][[id number]], and zolpidem (AMBIEN CR) 12.5 MG CR tablet [[id number]][[id number]]. [facility] [address]   Patient states pharmacy was questioning that her levothyroxine should be 75 mcg

## 2015-02-09 MED ORDER — ZOLPIDEM TARTRATE ER 12.5 MG PO TBCR: 12.5000 mg | EXTENDED_RELEASE_TABLET | Freq: Every evening | ORAL | Status: DC | PRN

## 2015-02-09 NOTE — Telephone Encounter (Signed)
done

## 2015-03-21 ENCOUNTER — Telehealth: Payer: Self-pay | Admitting: Family

## 2015-03-21 MED ORDER — ZOLPIDEM TARTRATE ER 12.5 MG PO TBCR: 12.5000 mg | EXTENDED_RELEASE_TABLET | Freq: Every evening | ORAL | Status: DC | PRN

## 2015-03-21 MED ORDER — TELMISARTAN-HCTZ 80-25 MG PO TABS: 1.0000 | ORAL_TABLET | Freq: Every day | ORAL | Status: DC

## 2015-03-21 NOTE — Telephone Encounter (Signed)
Pt request refill for zolpidem (AMBIEN CR) 12.5 MG and MICARDIS HCT) 80-25 MG to [facility] into [facility] on [address]. Pt is out of these med.

## 2015-03-21 NOTE — Telephone Encounter (Signed)
Medication refilled and printed to be faxed. 

## 2015-03-21 NOTE — Addendum Note (Signed)
Addended by: Jeanine Luz D on: 03/21/2015 05:04 PM   Modules accepted: Orders

## 2015-03-22 NOTE — Telephone Encounter (Signed)
Patient called and stated the pharmacy didn't receive the Zolpidem. Please refax

## 2015-03-23 NOTE — Telephone Encounter (Signed)
Rx re-faxed.

## 2015-04-20 ENCOUNTER — Other Ambulatory Visit: Payer: Self-pay | Admitting: Family

## 2015-04-21 ENCOUNTER — Telehealth: Payer: Self-pay | Admitting: Family

## 2015-04-21 NOTE — Telephone Encounter (Signed)
Pt came into the office and said that [facility] not [facility] zolpidem (AMBIEN CR) 12.5 MG CR tablet [[id number]] .    She is completely out.  She needs it to day if possible .Marland Kitchen  We are making a fu appt asap     [facility] [address]  Best call you [phone]

## 2015-04-21 NOTE — Telephone Encounter (Signed)
Rx has been faxed called pt to let her know. No answer. LVM making her aware it has been sent.

## 2015-04-25 ENCOUNTER — Encounter: Payer: Self-pay | Admitting: Family

## 2015-04-25 ENCOUNTER — Ambulatory Visit (INDEPENDENT_AMBULATORY_CARE_PROVIDER_SITE_OTHER): Payer: No Typology Code available for payment source | Admitting: Family

## 2015-04-25 ENCOUNTER — Other Ambulatory Visit: Payer: No Typology Code available for payment source

## 2015-04-25 ENCOUNTER — Other Ambulatory Visit (INDEPENDENT_AMBULATORY_CARE_PROVIDER_SITE_OTHER): Payer: No Typology Code available for payment source

## 2015-04-25 ENCOUNTER — Telehealth: Payer: Self-pay | Admitting: Family

## 2015-04-25 VITALS — BP 126/72 | HR 62 | Temp 98.1°F | Resp 18 | Ht 67.0 in | Wt 258.0 lb

## 2015-04-25 DIAGNOSIS — E039 Hypothyroidism, unspecified: Secondary | ICD-10-CM

## 2015-04-25 DIAGNOSIS — R8290 Unspecified abnormal findings in urine: Secondary | ICD-10-CM

## 2015-04-25 DIAGNOSIS — E119 Type 2 diabetes mellitus without complications: Secondary | ICD-10-CM | POA: Diagnosis not present

## 2015-04-25 LAB — BASIC METABOLIC PANEL
BUN: 15 mg/dL (ref 6–23)
CHLORIDE: 105 meq/L (ref 96–112)
CO2: 29 mEq/L (ref 19–32)
Calcium: 9.3 mg/dL (ref 8.4–10.5)
Creatinine, Ser: 0.77 mg/dL (ref 0.40–1.20)
GFR: 97.54 mL/min (ref >60.00)
Glucose, Bld: 102 mg/dL — ABNORMAL HIGH (ref 70–99)
POTASSIUM: 4.2 meq/L (ref 3.5–5.1)
SODIUM: 141 meq/L (ref 135–145)

## 2015-04-25 LAB — POCT URINALYSIS DIPSTICK
Bilirubin, UA: NEGATIVE
Blood, UA: NEGATIVE
GLUCOSE UA: NEGATIVE
Ketones, UA: NEGATIVE
NITRITE UA: NEGATIVE
UROBILINOGEN UA: NEGATIVE
pH, UA: 6

## 2015-04-25 LAB — HEMOGLOBIN A1C: HEMOGLOBIN A1C: 5.8 % (ref 4.6–6.5)

## 2015-04-25 LAB — TSH: TSH: 3.63 u[IU]/mL (ref 0.35–4.50)

## 2015-04-25 MED ORDER — SULFAMETHOXAZOLE-TRIMETHOPRIM 800-160 MG PO TABS: 1.0000 | ORAL_TABLET | Freq: Two times a day (BID) | ORAL | Status: DC

## 2015-04-25 NOTE — Progress Notes (Signed)
Pre visit review using our clinic review tool, if applicable. No additional management support is needed unless otherwise documented below in the visit note. 

## 2015-04-25 NOTE — Patient Instructions (Signed)
Thank you for choosing [facility].  Summary/Instructions:  Your prescription(s) have been submitted to your pharmacy or been printed and provided for you. Please take as directed and contact our office if you believe you are having problem(s) with the medication(s) or have any questions.  Please stop by the lab on the basement level of the building for your blood work. Your results will be released to MyChart (or called to you) after review, usually within 72 hours after test completion. If any changes need to be made, you will be notified at that same time.  If your symptoms worsen or fail to improve, please contact our office for further instruction, or in case of emergency go directly to the emergency room at the closest medical facility.    Urinary Tract Infection Urinary tract infections (UTIs) can develop anywhere along your urinary tract. Your urinary tract is your body's drainage system for removing wastes and extra water. Your urinary tract includes two kidneys, two ureters, a bladder, and a urethra. Your kidneys are a pair of bean-shaped organs. Each kidney is about the size of your fist. They are located below your ribs, one on each side of your spine. CAUSES Infections are caused by microbes, which are microscopic organisms, including fungi, viruses, and bacteria. These organisms are so small that they can only be seen through a microscope. Bacteria are the microbes that most commonly cause UTIs. SYMPTOMS  Symptoms of UTIs may vary by age and gender of the patient and by the location of the infection. Symptoms in young women typically include a frequent and intense urge to urinate and a painful, burning feeling in the bladder or urethra during urination. Older women and men are more likely to be tired, shaky, and weak and have muscle aches and abdominal pain. A fever may mean the infection is in your kidneys. Other symptoms of a kidney infection include pain in your back or sides  below the ribs, nausea, and vomiting. DIAGNOSIS To diagnose a UTI, your caregiver will ask you about your symptoms. Your caregiver also will ask to provide a urine sample. The urine sample will be tested for bacteria and white blood cells. White blood cells are made by your body to help fight infection. TREATMENT  Typically, UTIs can be treated with medication. Because most UTIs are caused by a bacterial infection, they usually can be treated with the use of antibiotics. The choice of antibiotic and length of treatment depend on your symptoms and the type of bacteria causing your infection. HOME CARE INSTRUCTIONS  If you were prescribed antibiotics, take them exactly as your caregiver instructs you. Finish the medication even if you feel better after you have only taken some of the medication.  Drink enough water and fluids to keep your urine clear or pale yellow.  Avoid caffeine, tea, and carbonated beverages. They tend to irritate your bladder.  Empty your bladder often. Avoid holding urine for long periods of time.  Empty your bladder before and after sexual intercourse.  After a bowel movement, women should cleanse from front to back. Use each tissue only once. SEEK MEDICAL CARE IF:   You have back pain.  You develop a fever.  Your symptoms do not begin to resolve within 3 days. SEEK IMMEDIATE MEDICAL CARE IF:   You have severe back pain or lower abdominal pain.  You develop chills.  You have nausea or vomiting.  You have continued burning or discomfort with urination. MAKE SURE YOU:   Understand   these instructions.  Will watch your condition.  Will get help right away if you are not doing well or get worse. Document Released: 05/09/2005 Document Revised: 01/29/2012 Document Reviewed: 09/07/2011 Exit[employer] Patient Information 2015 [employer]. This information is not intended to replace advice given to you by your health care provider. Make sure you discuss any  questions you have with your health care provider.  

## 2015-04-25 NOTE — Telephone Encounter (Signed)
Please inform patient that both her thyroid and A1c are within the normal limits and therefore there is no need to restart either medication at this time. If she does experience fatigue or changes to hair, skin or nails, please have her follow up and we can restart her levothyroxine. Please have her follow up in about 6 months or sooner if needed.

## 2015-04-25 NOTE — Progress Notes (Signed)
Subjective:    Patient ID: Catherine Baker, female    DOB: 1950/09/26, 64 y.o.   MRN: [id number]  Chief Complaint  Patient presents with  . Follow-up    wants urine checked bc she has some abdomen issues and urine odor, has not been taking synthroid or metformin, wants labs drawn to check them    HPI:  Catherine Baker is a 64 y.o. female with a PMH of hypertension, hypothyroidism and type 2 diabetes and insomnia who presents today for an office follow up.   1.)  Urinary issues - This is a new problem. Associated symptom of darker urine with increase odor has been going on for about 1.5 weeks. Does describe some urgency and frequency and right side flank pain with no dysuria. Denies fevers or chills. Denies any modifying treatments in attempt to make it better.   2.) Diabetes - Previous A1c was 5.8 and was trialed on a drug holiday and discontinued taking her metformin. Indicates that she does have excessive thirst and hunger, and denies numbness and tingling. Was seen by her opthalmologist recently and informed that her eyesight has significantly changed over the course year.   Lab Results  Component Value Date   HGBA1C 5.8 11/29/2014    3.) Hypothyroidism - Indicates that she misunderstood and has stopped taking her levothyroxine since her last office visit. Due for an updated TSH.   Lab Results  Component Value Date   TSH 2.71 11/29/2014    Allergies  Allergen Reactions  . Codeine Hives and Nausea Only    Current Outpatient Prescriptions on File Prior to Visit  Medication Sig Dispense Refill  . b complex vitamins tablet Take 1 tablet by mouth daily.    . furosemide (LASIX) 20 MG tablet Take 1 tablet (20 mg total) by mouth as needed for edema. 90 tablet 3  . levothyroxine (LEVOTHROID) 75 MCG tablet Take 1 tablet (75 mcg total) by mouth daily before breakfast. 90 tablet 3  . metFORMIN (GLUCOPHAGE) 500 MG tablet Take 1 tablet (500 mg total) by mouth 2 (two) times daily with a  meal. 180 tablet 3  . omega-3 acid ethyl esters (LOVAZA) 1 G capsule Take 1 g by mouth daily.    Marland Kitchen telmisartan-hydrochlorothiazide (MICARDIS HCT) 80-25 MG per tablet Take 1 tablet by mouth daily. 90 tablet 3  . zolpidem (AMBIEN CR) 12.5 MG CR tablet TAKE ONE TABLET BY MOUTH AT BEDTIME AS NEEDED FOR SLEEP 30 tablet 0   No current facility-administered medications on file prior to visit.    Past Medical History  Diagnosis Date  . Hypertension   . Diabetes mellitus without complication     Review of Systems  Constitutional: Negative for fever and chills.  Eyes:       Positive for changes in vision.   Endocrine: Positive for polydipsia. Negative for polyphagia and polyuria.  Genitourinary: Positive for urgency, frequency and flank pain. Negative for dysuria.      Objective:    BP 126/72 mmHg  Pulse 62  Temp(Src) 98.1 F (36.7 C) (Oral)  Resp 18  Ht 5\' 7"  (1.702 m)  Wt 258 lb (117.028 kg)  BMI 40.40 kg/m2  SpO2 98% Nursing note and vital signs reviewed.  Physical Exam  Constitutional: She is oriented to person, place, and time. She appears well-developed and well-nourished. No distress.  Cardiovascular: Normal rate, regular rhythm, normal heart sounds and intact distal pulses.   Pulmonary/Chest: Effort normal and breath sounds normal.  Abdominal: There is  no CVA tenderness.  Neurological: She is alert and oriented to person, place, and time.  Skin: Skin is warm and dry.  Psychiatric: She has a normal mood and affect. Her behavior is normal. Judgment and thought content normal.       Assessment & Plan:   Problem List Items Addressed This Visit      Endocrine   Hypothyroidism    Misunderstood previous message regarding hypothyroid results causing her to stop taking the levothyroxine for the past 6 months. Obtain TSH to determine current status. Follow-up pending TSH results.      Relevant Orders   TSH   Type 2 diabetes mellitus - Primary    Type 2 diabetes  previously well controlled with metformin and patient requested a trial without medication. She was initially supposed to follow up in 3 months, however it is taken her 6 months to follow-up. Obtain A1c and basic metabolic panel. Follow up pending A1c results.      Relevant Orders   Hemoglobin A1c   Basic Metabolic Panel (BMET)     Other   Abnormal urine odor    In office urinalysis positive for leukocytes and negative for hematuria or nitrates. Start Bactrim. Urine sent for culture. Instructed to increase fluid intake. Follow-up if symptoms worsen or fail to improve.      Relevant Medications   sulfamethoxazole-trimethoprim (BACTRIM DS,SEPTRA DS) 800-160 MG per tablet   Other Relevant Orders   POCT urinalysis dipstick (Completed)   Urine culture

## 2015-04-25 NOTE — Assessment & Plan Note (Signed)
Misunderstood previous message regarding hypothyroid results causing her to stop taking the levothyroxine for the past 6 months. Obtain TSH to determine current status. Follow-up pending TSH results.

## 2015-04-25 NOTE — Assessment & Plan Note (Signed)
Type 2 diabetes previously well controlled with metformin and patient requested a trial without medication. She was initially supposed to follow up in 3 months, however it is taken her 6 months to follow-up. Obtain A1c and basic metabolic panel. Follow up pending A1c results.

## 2015-04-25 NOTE — Assessment & Plan Note (Signed)
In office urinalysis positive for leukocytes and negative for hematuria or nitrates. Start Bactrim. Urine sent for culture. Instructed to increase fluid intake. Follow-up if symptoms worsen or fail to improve.

## 2015-04-27 ENCOUNTER — Telehealth: Payer: Self-pay | Admitting: Family

## 2015-04-27 LAB — URINE CULTURE

## 2015-04-27 NOTE — Telephone Encounter (Signed)
Please inform patient that she did have a positive urine culture for infection and the antibiotic was appropriate.

## 2015-04-28 NOTE — Telephone Encounter (Signed)
Pt aware.

## 2015-04-28 NOTE — Telephone Encounter (Signed)
Pt aware of results 

## 2015-05-18 ENCOUNTER — Other Ambulatory Visit: Payer: Self-pay | Admitting: Family

## 2015-05-18 NOTE — Telephone Encounter (Signed)
Last refill was 9/7.

## 2015-05-19 NOTE — Telephone Encounter (Signed)
Pt called to check up this request, she is out of this med, please call pt back once this is approve.

## 2015-05-20 ENCOUNTER — Other Ambulatory Visit: Payer: Self-pay | Admitting: Internal Medicine

## 2015-05-20 MED ORDER — ZOLPIDEM TARTRATE ER 12.5 MG PO TBCR: 12.5000 mg | EXTENDED_RELEASE_TABLET | Freq: Every evening | ORAL | Status: DC | PRN

## 2015-05-20 NOTE — Telephone Encounter (Signed)
Tammy Sours is out of office pls advise on refill,,,/lmb

## 2015-07-26 ENCOUNTER — Emergency Department (HOSPITAL_BASED_OUTPATIENT_CLINIC_OR_DEPARTMENT_OTHER): Payer: No Typology Code available for payment source

## 2015-07-26 ENCOUNTER — Emergency Department (HOSPITAL_BASED_OUTPATIENT_CLINIC_OR_DEPARTMENT_OTHER)
Admission: EM | Admit: 2015-07-26 | Discharge: 2015-07-27 | Disposition: A | Discharge status: Home / Self Care | Payer: No Typology Code available for payment source | Attending: Emergency Medicine | Admitting: Emergency Medicine

## 2015-07-26 ENCOUNTER — Encounter (HOSPITAL_BASED_OUTPATIENT_CLINIC_OR_DEPARTMENT_OTHER): Payer: Self-pay

## 2015-07-26 ENCOUNTER — Telehealth: Payer: Self-pay | Admitting: Family

## 2015-07-26 DIAGNOSIS — R1030 Lower abdominal pain, unspecified: Secondary | ICD-10-CM | POA: Insufficient documentation

## 2015-07-26 DIAGNOSIS — Z792 Long term (current) use of antibiotics: Secondary | ICD-10-CM | POA: Diagnosis not present

## 2015-07-26 DIAGNOSIS — Z79899 Other long term (current) drug therapy: Secondary | ICD-10-CM | POA: Diagnosis not present

## 2015-07-26 DIAGNOSIS — R300 Dysuria: Secondary | ICD-10-CM | POA: Diagnosis not present

## 2015-07-26 DIAGNOSIS — Z7984 Long term (current) use of oral hypoglycemic drugs: Secondary | ICD-10-CM | POA: Diagnosis not present

## 2015-07-26 DIAGNOSIS — M545 Low back pain: Secondary | ICD-10-CM | POA: Diagnosis not present

## 2015-07-26 DIAGNOSIS — E119 Type 2 diabetes mellitus without complications: Secondary | ICD-10-CM | POA: Diagnosis not present

## 2015-07-26 DIAGNOSIS — I10 Essential (primary) hypertension: Secondary | ICD-10-CM | POA: Insufficient documentation

## 2015-07-26 DIAGNOSIS — Z87891 Personal history of nicotine dependence: Secondary | ICD-10-CM | POA: Insufficient documentation

## 2015-07-26 DIAGNOSIS — R110 Nausea: Secondary | ICD-10-CM | POA: Diagnosis not present

## 2015-07-26 DIAGNOSIS — R1031 Right lower quadrant pain: Secondary | ICD-10-CM | POA: Insufficient documentation

## 2015-07-26 LAB — COMPREHENSIVE METABOLIC PANEL
ALK PHOS: 107 U/L (ref 38–126)
ALT: 21 U/L (ref 14–54)
AST: 18 U/L (ref 15–41)
Albumin: 4 g/dL (ref 3.5–5.0)
Anion gap: 6 (ref 5–15)
BILIRUBIN TOTAL: 0.5 mg/dL (ref 0.3–1.2)
BUN: 12 mg/dL (ref 6–20)
CALCIUM: 9.5 mg/dL (ref 8.9–10.3)
CO2: 31 mmol/L (ref 22–32)
CREATININE: 0.76 mg/dL (ref 0.44–1.00)
Chloride: 101 mmol/L (ref 101–111)
Glucose, Bld: 121 mg/dL — ABNORMAL HIGH (ref 65–99)
Potassium: 3.5 mmol/L (ref 3.5–5.1)
Sodium: 138 mmol/L (ref 135–145)
Total Protein: 7.6 g/dL (ref 6.5–8.1)

## 2015-07-26 LAB — URINALYSIS, ROUTINE W REFLEX MICROSCOPIC
BILIRUBIN URINE: NEGATIVE
Glucose, UA: NEGATIVE mg/dL
HGB URINE DIPSTICK: NEGATIVE
Ketones, ur: NEGATIVE mg/dL
Leukocytes, UA: NEGATIVE
Nitrite: NEGATIVE
PH: 6.5 (ref 5.0–8.0)
Protein, ur: NEGATIVE mg/dL
SPECIFIC GRAVITY, URINE: 1.02 (ref 1.005–1.030)

## 2015-07-26 LAB — CBC
HCT: 43.6 % (ref 36.0–46.0)
Hemoglobin: 14.5 g/dL (ref 12.0–15.0)
MCH: 29.6 pg (ref 26.0–34.0)
MCHC: 33.3 g/dL (ref 30.0–36.0)
MCV: 89 fL (ref 78.0–100.0)
PLATELETS: 337 10*3/uL (ref 150–400)
RBC: 4.9 MIL/uL (ref 3.87–5.11)
RDW: 12.7 % (ref 11.5–15.5)
WBC: 8.2 10*3/uL (ref 4.0–10.5)

## 2015-07-26 MED ORDER — KETOROLAC TROMETHAMINE 30 MG/ML IJ SOLN
INTRAMUSCULAR | Status: AC
  Filled 2015-07-26: qty 1

## 2015-07-26 MED ORDER — OXYCODONE-ACETAMINOPHEN 5-325 MG PO TABS: 1.0000 | ORAL_TABLET | Freq: Once | ORAL | Status: DC

## 2015-07-26 MED ORDER — KETOROLAC TROMETHAMINE 30 MG/ML IJ SOLN
30.0000 mg | Freq: Once | INTRAMUSCULAR | Status: AC
  Administered 2015-07-26: 30 mg via INTRAVENOUS

## 2015-07-26 MED ORDER — ZOLPIDEM TARTRATE ER 12.5 MG PO TBCR: 12.5000 mg | EXTENDED_RELEASE_TABLET | Freq: Every evening | ORAL | Status: DC | PRN

## 2015-07-26 NOTE — ED Notes (Signed)
MD at bedside. 

## 2015-07-26 NOTE — ED Provider Notes (Signed)
CSN: [id number]     Arrival date & time 07/26/15  1937 History  By signing my name below, I, Jarvis Morgan, attest that this documentation has been prepared under the direction and in the presence of No att. providers found. Electronically Signed: Jarvis Morgan, ED Scribe. 07/27/2015. 2:59 AM.   Chief Complaint  Patient presents with  . Dysuria    The history is provided by the patient. No language interpreter was used.    HPI Comments: Catherine Baker is a 64 y.o. female who presents to the Emergency Department complaining of gradual onset, constant, severe, 10/10, dull, RLQ abdominal pain for 3 weeks that began to gradually worsen over the past 3 days. She reports associated low back pain, nausea, dysuria, and malodorous urine. Pt notes she has been taking Ibuprofen with no significant relief. She denies any alleviating/aggravating factors. Pt states her last sexual activity was 3 weeks ago and she did not use protected but notes it was not a new partner. She denies any fever, diarrhea, vaginal discharge, difficulty urinating, or other associated symptoms.   Past Medical History  Diagnosis Date  . Hypertension   . Diabetes mellitus without complication [facility])    Past Surgical History  Procedure Laterality Date  . Abdominal hysterectomy    . Tubal ligation     Family History  Problem Relation Age of Onset  . Hypertension Mother   . Heart disease Mother   . Hyperlipidemia Mother   . Hypertension Father   . Heart disease Father   . Hyperlipidemia Father   . Breast cancer Maternal Grandmother    Social History  Substance Use Topics  . Smoking status: Former Games developer  . Smokeless tobacco: Never Used  . Alcohol Use: Yes     Comment: occasional   OB History    No data available     Review of Systems  Constitutional: Negative for fever.  Gastrointestinal: Positive for nausea and abdominal pain. Negative for diarrhea.  Genitourinary: Positive for dysuria. Negative for vaginal  discharge and difficulty urinating.  Musculoskeletal: Positive for back pain.      Allergies  Codeine  Home Medications   Prior to Admission medications   Medication Sig Start Date End Date Taking? Authorizing Provider  b complex vitamins tablet Take 1 tablet by mouth daily.    Historical Provider, MD  furosemide (LASIX) 20 MG tablet Take 1 tablet (20 mg total) by mouth as needed for edema. 02/08/15   Veryl Speak, FNP  levothyroxine (LEVOTHROID) 75 MCG tablet Take 1 tablet (75 mcg total) by mouth daily before breakfast. 02/08/15   Veryl Speak, FNP  metFORMIN (GLUCOPHAGE) 500 MG tablet Take 1 tablet (500 mg total) by mouth 2 (two) times daily with a meal. 02/08/15   Veryl Speak, FNP  omega-3 acid ethyl esters (LOVAZA) 1 G capsule Take 1 g by mouth daily.    Historical Provider, MD  sulfamethoxazole-trimethoprim (BACTRIM DS,SEPTRA DS) 800-160 MG per tablet Take 1 tablet by mouth 2 (two) times daily. 04/25/15   Veryl Speak, FNP  telmisartan-hydrochlorothiazide (MICARDIS HCT) 80-25 MG per tablet Take 1 tablet by mouth daily. 03/21/15   Veryl Speak, FNP  zolpidem (AMBIEN CR) 12.5 MG CR tablet Take 1 tablet (12.5 mg total) by mouth at bedtime as needed. for sleep 07/26/15   Veryl Speak, FNP   Triage Vitals: BP 197/83 mmHg  Pulse 62  Temp(Src) 98 F (36.7 C) (Oral)  Resp 18  Ht  (1.702 m)  Wt 260 lb (117.935 kg)  BMI 40.71 kg/m2  SpO2 100%  Physical Exam  Constitutional: She is oriented to person, place, and time. She appears well-developed and well-nourished. No distress.  HENT:  Head: Normocephalic and atraumatic.  Cardiovascular: Normal rate, regular rhythm and normal heart sounds.   Pulmonary/Chest: Effort normal. No respiratory distress. She has no wheezes.  Abdominal: Soft. Bowel sounds are normal. There is no rebound and no guarding.  Mild tenderness to palpation over the suprapubic and right lower quadrant, no rebound or guarding noted   Genitourinary:  Normal external vaginal exam, no lesions noted, moderate vaginal discharge, no cervical motion or adnexal tenderness  Neurological: She is alert and oriented to person, place, and time.  Skin: Skin is warm and dry.  Psychiatric: She has a normal mood and affect.  Nursing note and vitals reviewed.   ED Course  Procedures (including critical care time)  DIAGNOSTIC STUDIES: Oxygen Saturation is 100% on RA, normal by my interpretation.    COORDINATION OF CARE:    Labs Review Labs Reviewed  WET PREP, GENITAL - Abnormal; Notable for the following:    WBC, Wet Prep HPF POC MODERATE (*)    All other components within normal limits  COMPREHENSIVE METABOLIC PANEL - Abnormal; Notable for the following:    Glucose, Bld 121 (*)    All other components within normal limits  URINALYSIS, ROUTINE W REFLEX MICROSCOPIC (NOT AT [facility])  CBC    Imaging Review Dg Abd 1 View  07/26/2015  CLINICAL DATA:  Right lower abdominal pain for 2 weeks. Increasing in intensity for 2 days. EXAM: ABDOMEN - 1 VIEW COMPARISON:  CT abdomen and pelvis 09/17/2011 FINDINGS: Scattered gas and stool in the colon. No small or large bowel distention. No radiopaque stones. Calcified phleboliths in the pelvis. Calcification projected over the right sacrum corresponds to a soft tissue calcified granuloma in the CT scan. Old ununited ossicle over the right hip greater trochanter. Degenerative changes in the lower lumbar spine. IMPRESSION: Normal nonobstructive bowel gas pattern. Electronically Signed   By: Burman NievesWilliam  Stevens M.D.   On: 07/26/2015 23:57   I have personally reviewed and evaluated these images and lab results as part of my medical decision-making.   EKG Interpretation None      MDM   Final diagnoses:  Lower abdominal pain    Patient presents with acute on chronic abdominal pain. Indolent nature and ongoing for the last several weeks. Abdominal exam is relatively benign without signs of  peritonitis. No evidence of urinary tract infection and basic labwork is reassuring. Patient reports that she's not concerned for STDs. Pelvic exam is unremarkable. KUB without evidence of constipation or air-fluid levels suggestive of obstruction. Given the indolent nature pain, have low suspicion at this time for emergent intra-abdominal process. Did discuss with patient follow-up with her primary physician and if pain is ongoing, she may need a pelvic ultrasound or CT scan to evaluate further. Patient was understanding. Patient will be given a short course of pain medication at discharge.  After history, exam, and medical workup I feel the patient has been appropriately medically screened and is safe for discharge home. Pertinent diagnoses were discussed with the patient. Patient was given return precautions.  I personally performed the services described in this documentation, which was scribed in my presence. The recorded information has been reviewed and is accurate.     Shon Batonourtney F Horton, MD 07/27/15 [phone]

## 2015-07-26 NOTE — Telephone Encounter (Signed)
Medication printed and signed to be faxed.  

## 2015-07-26 NOTE — Telephone Encounter (Signed)
Patient is requesting a refill of zolpidem (AMBIEN CR) 12.5 MG CR tablet sent to [facility] on [address]

## 2015-07-26 NOTE — ED Notes (Signed)
C/o dysuria,freq x "couple weeks"-NAD-steady gait

## 2015-07-26 NOTE — ED Notes (Signed)
Patient transported to X-ray 

## 2015-07-27 LAB — WET PREP, GENITAL
Clue Cells Wet Prep HPF POC: NONE SEEN
SPERM: NONE SEEN
Trich, Wet Prep: NONE SEEN
YEAST WET PREP: NONE SEEN

## 2015-07-27 LAB — GC/CHLAMYDIA PROBE AMP (~~LOC~~) NOT AT ARMC
Chlamydia: NEGATIVE
Neisseria Gonorrhea: NEGATIVE

## 2015-07-27 NOTE — Telephone Encounter (Signed)
Called and advised.

## 2015-08-31 ENCOUNTER — Other Ambulatory Visit: Payer: Self-pay | Admitting: Family

## 2015-09-01 ENCOUNTER — Other Ambulatory Visit: Payer: Self-pay

## 2015-09-01 MED ORDER — ZOLPIDEM TARTRATE ER 12.5 MG PO TBCR: 12.5000 mg | EXTENDED_RELEASE_TABLET | Freq: Every evening | ORAL | Status: DC | PRN

## 2015-09-01 NOTE — Telephone Encounter (Signed)
Ok to refill 

## 2015-09-01 NOTE — Telephone Encounter (Signed)
Refill call in to pharmacy had to leave on pharmacist vm...Raechel Chute

## 2015-09-01 NOTE — Telephone Encounter (Signed)
Greg is out of office pls advise on refill.../lmb 

## 2015-11-07 ENCOUNTER — Other Ambulatory Visit: Payer: Self-pay | Admitting: Family

## 2015-11-07 NOTE — Telephone Encounter (Signed)
Last refill was 06/21/16 

## 2015-12-06 ENCOUNTER — Other Ambulatory Visit: Payer: Self-pay | Admitting: Family

## 2015-12-07 NOTE — Telephone Encounter (Signed)
Last refill was 11/07/15.

## 2016-01-10 ENCOUNTER — Other Ambulatory Visit: Payer: Self-pay | Admitting: Family

## 2016-01-10 NOTE — Telephone Encounter (Signed)
Last refill 12/07/15.

## 2016-03-03 ENCOUNTER — Other Ambulatory Visit: Payer: Self-pay | Admitting: Family

## 2016-03-15 IMAGING — CR DG ABDOMEN 1V
2 series · 2 of 2 positions shown · non-contrast
Comparison: CT abdomen and pelvis 09/17/2011

CLINICAL DATA: Right lower abdominal pain for 2 weeks. Increasing
in intensity for 2 days.

EXAM:
ABDOMEN - 1 VIEW

[t abdomen supine (1 of 2)]
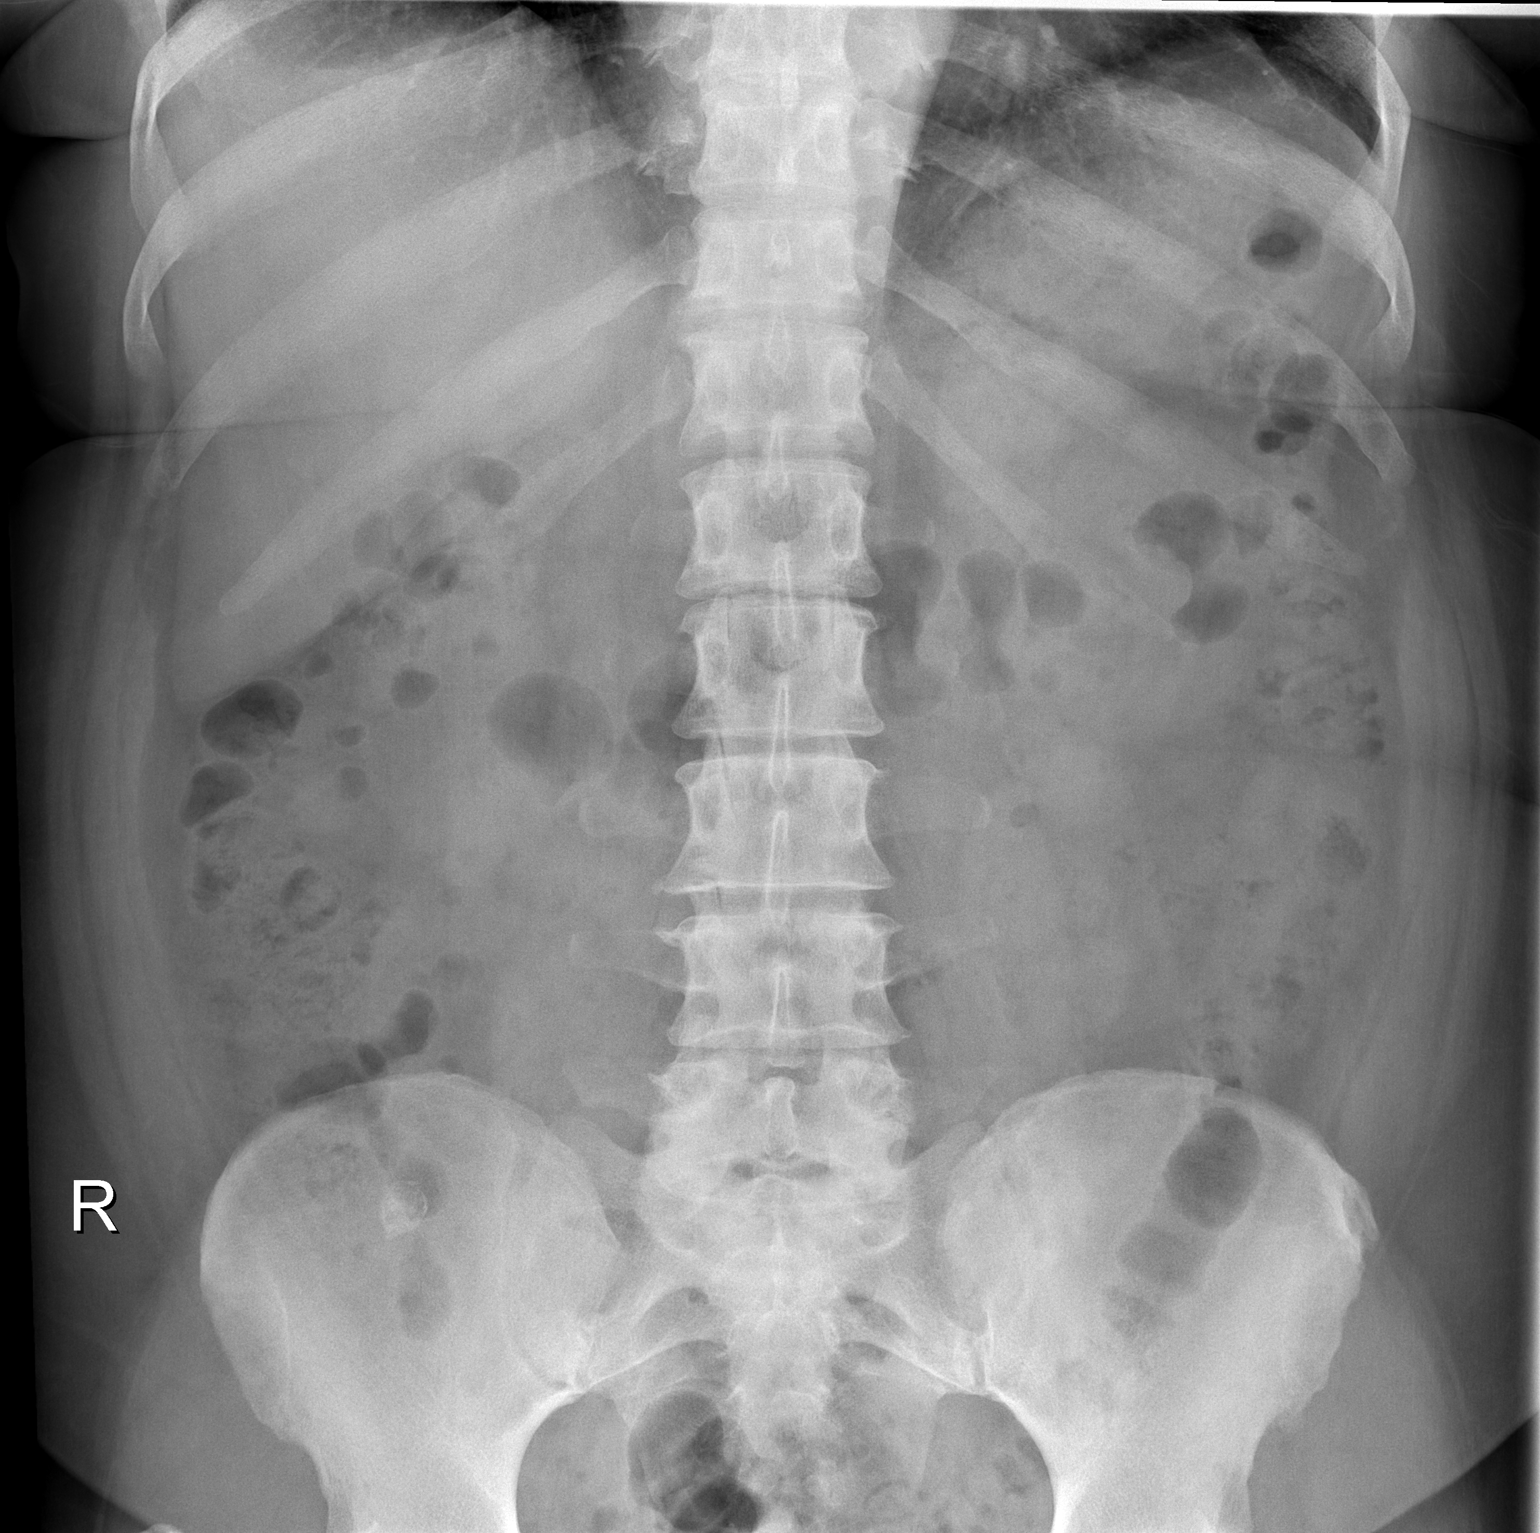

[t abdomen supine (2 of 2)]
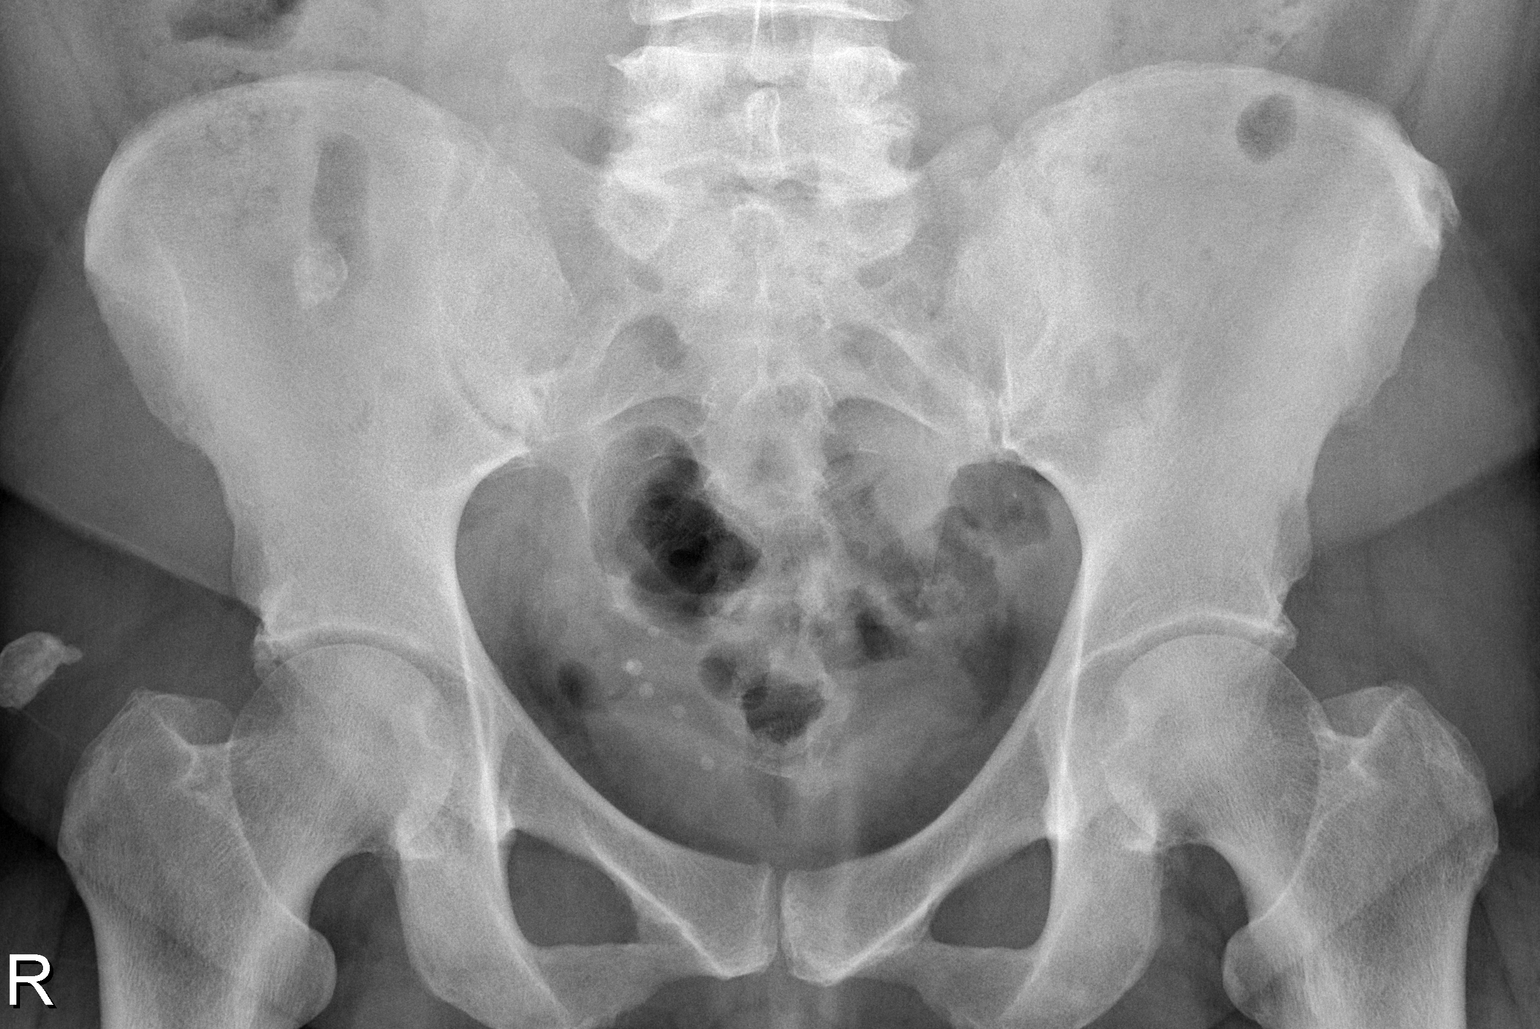

[2 of 2 positions shown; findings below may reference images not displayed]

FINDINGS: Scattered gas and stool in the colon. No small or large bowel
distention. No radiopaque stones. Calcified phleboliths in the
pelvis. Calcification projected over the right sacrum corresponds to
a soft tissue calcified granuloma in the CT scan. Old ununited
ossicle over the right hip greater trochanter. Degenerative changes
in the lower lumbar spine.
IMPRESSION: Normal nonobstructive bowel gas pattern.

## 2016-03-20 ENCOUNTER — Other Ambulatory Visit: Payer: Self-pay | Admitting: Family

## 2016-03-21 ENCOUNTER — Other Ambulatory Visit: Payer: Self-pay | Admitting: Family

## 2016-03-21 MED ORDER — ZOLPIDEM TARTRATE ER 12.5 MG PO TBCR
12.5000 mg | EXTENDED_RELEASE_TABLET | Freq: Every evening | ORAL | 0 refills | Status: DC | PRN
Start: 2016-03-21 — End: 2016-03-28

## 2016-03-26 ENCOUNTER — Ambulatory Visit: Payer: No Typology Code available for payment source | Admitting: Family

## 2016-03-28 ENCOUNTER — Encounter: Payer: Self-pay | Admitting: Family

## 2016-03-28 ENCOUNTER — Ambulatory Visit (INDEPENDENT_AMBULATORY_CARE_PROVIDER_SITE_OTHER): Payer: BLUE CROSS/BLUE SHIELD | Admitting: Family

## 2016-03-28 VITALS — BP 148/82 | HR 68 | Temp 98.2°F | Resp 16 | Ht 67.0 in | Wt 257.0 lb

## 2016-03-28 DIAGNOSIS — G4700 Insomnia, unspecified: Secondary | ICD-10-CM

## 2016-03-28 DIAGNOSIS — E039 Hypothyroidism, unspecified: Secondary | ICD-10-CM | POA: Diagnosis not present

## 2016-03-28 DIAGNOSIS — M62830 Muscle spasm of back: Secondary | ICD-10-CM

## 2016-03-28 DIAGNOSIS — M5136 Other intervertebral disc degeneration, lumbar region: Secondary | ICD-10-CM | POA: Insufficient documentation

## 2016-03-28 DIAGNOSIS — E119 Type 2 diabetes mellitus without complications: Secondary | ICD-10-CM | POA: Diagnosis not present

## 2016-03-28 DIAGNOSIS — I10 Essential (primary) hypertension: Secondary | ICD-10-CM

## 2016-03-28 MED ORDER — TELMISARTAN-HCTZ 80-25 MG PO TABS: 1.0000 | ORAL_TABLET | Freq: Every day | ORAL | 1 refills | Status: DC

## 2016-03-28 MED ORDER — METHOCARBAMOL 500 MG PO TABS: 500.0000 mg | ORAL_TABLET | Freq: Three times a day (TID) | ORAL | 0 refills | Status: DC | PRN

## 2016-03-28 MED ORDER — ZOLPIDEM TARTRATE ER 12.5 MG PO TBCR: 12.5000 mg | EXTENDED_RELEASE_TABLET | Freq: Every evening | ORAL | 0 refills | Status: DC | PRN

## 2016-03-28 NOTE — Assessment & Plan Note (Signed)
Type 2 diabetes currently managed on lifestyle with previous A1c of 5.8. Obtain A1c and microalbumin. Diabetic foot exam completed today. Diabetic eye exam up-to-date per patient with no irregularities. Currently maintained on telmisartan for CAD risk reduction. Declines Pneumovax today. Continue to monitor blood sugars at home periodically pending A1c results.

## 2016-03-28 NOTE — Assessment & Plan Note (Signed)
Not currently maintained on medication. Obtain TSH to check current status.

## 2016-03-28 NOTE — Progress Notes (Signed)
Subjective:    Patient ID: Catherine Baker, female    DOB: May 15, 1951, 65 y.o.   MRN: [id number]  Chief Complaint  Patient presents with  . Medication Refill    needs all refills, back pain from fall over the weekend    HPI:  Catherine Baker is a 65 y.o. female who  has a past medical history of Diabetes mellitus without complication (HCC) and Hypertension. and presents today for a follow up office visit.   1.) Type 2 diabetes -Previous A1c of 5.8 and metformin discontinued. Currently maintained on lifestyle. Reports checking her blood sugar at home on occasion with the highest being 130. Denies any symptoms of end organ damage.  Lab Results  Component Value Date   HGBA1C 5.8 04/25/2015    2.) Fall - Associated symptom of pain located across her upper back has been going on for about 4 days following a fall from a recliner chair landing on her back. Pain is described as sharp and occasional throbbing. Modifying factors include 800 mg of ibuprofen which has not helped very much. Has also been using heat to help. No numbness/tingling, saddle anestheisa or changes to bowel/bladder habits.  3.) Sleep - Currently maintained on Ambien. Reports taking the medication as prescribed and denies adverse side effects. Currently sleeping about 8 hours per night and feeling well rested with no residual side effects.   4.) Hypertension - Currently maintained on telmisartan-hydrochlorothiazide. Reports taking the medication with occasaionlly missing dosages. No symptoms of end organ damage or worst headache of life. She has not taken her medication yet this morning.    BP Readings from Last 3 Encounters:  03/28/16 (!) 148/82  07/27/15 158/77  04/25/15 126/72    Allergies  Allergen Reactions  . Codeine Hives and Nausea Only     Current Outpatient Prescriptions on File Prior to Visit  Medication Sig Dispense Refill  . b complex vitamins tablet Take 1 tablet by mouth daily.    Marland Kitchen omega-3 acid  ethyl esters (LOVAZA) 1 G capsule Take 1 g by mouth daily.     No current facility-administered medications on file prior to visit.      Past Surgical History:  Procedure Laterality Date  . ABDOMINAL HYSTERECTOMY    . TUBAL LIGATION      Past Medical History:  Diagnosis Date  . Diabetes mellitus without complication (HCC)   . Hypertension      Review of Systems  Constitutional: Negative for chills and fever.  Eyes:       Denies changes in vision  Respiratory: Negative for cough, chest tightness, shortness of breath and wheezing.   Cardiovascular: Negative for chest pain, palpitations and leg swelling.  Endocrine: Negative for polydipsia, polyphagia and polyuria.  Neurological: Negative for dizziness, weakness, light-headedness and numbness.      Objective:    BP (!) 148/82 (BP Location: Left Arm, Patient Position: Sitting, Cuff Size: Large)   Pulse 68   Temp 98.2 F (36.8 C) (Oral)   Resp 16   Ht 5\' 7"  (1.702 m)   Wt 257 lb (116.6 kg)   SpO2 98%   BMI 40.25 kg/m  Nursing note and vital signs reviewed.  Physical Exam  Constitutional: She is oriented to person, place, and time. She appears well-developed and well-nourished. No distress.  Cardiovascular: Normal rate, regular rhythm, normal heart sounds and intact distal pulses.   Pulmonary/Chest: Effort normal and breath sounds normal.  Musculoskeletal:  Mid back/low back - no obvious deformity, discoloration,  or edema. Tenderness elicited along thoracic and lumbar paraspinal musculature with mild spasm present. Range of motion is limited secondary to discomfort although able to complete all motions. Distal pulses and sensation are intact and appropriate in both upper and lower extremities. Negative straight leg raise. Negative Faber's.  Neurological: She is alert and oriented to person, place, and time.  Diabetic Foot Exam - Simple   Simple Foot Form Diabetic Foot exam was performed with the following findings:  Yes    03/28/2016  1:34 PM  Visual Inspection No deformities, no ulcerations, no other skin breakdown bilaterally:  Yes Sensation Testing Intact to touch and monofilament testing bilaterally:  Yes Pulse Check Posterior Tibialis and Dorsalis pulse intact bilaterally:  Yes Comments   Skin: Skin is warm and dry.  Psychiatric: She has a normal mood and affect. Her behavior is normal. Judgment and thought content normal.       Assessment & Plan:   Problem List Items Addressed This Visit      Cardiovascular and Mediastinum   Essential hypertension    Blood pressure slightly elevated above goal 140/90 today with current regimen. Question patient compliance as she has had several refills go by without needing refills for medications. Indicates she has not taken her blood pressure medication today. Encouraged importance of maintaining a lower blood pressure to help prevent end organ damage in the future. Continue current dosage of telmisartan-hydrochlorothiazide. Encouraged to follow low-sodium diet. Continue to monitor blood pressure at home.       Relevant Medications   telmisartan-hydrochlorothiazide (MICARDIS HCT) 80-25 MG tablet     Endocrine   Hypothyroidism    Not currently maintained on medication. Obtain TSH to check current status.      Relevant Orders   TSH   Type 2 diabetes mellitus (HCC) - Primary    Type 2 diabetes currently managed on lifestyle with previous A1c of 5.8. Obtain A1c and microalbumin. Diabetic foot exam completed today. Diabetic eye exam up-to-date per patient with no irregularities. Currently maintained on telmisartan for CAD risk reduction. Declines Pneumovax today. Continue to monitor blood sugars at home periodically pending A1c results.      Relevant Medications   telmisartan-hydrochlorothiazide (MICARDIS HCT) 80-25 MG tablet   Other Relevant Orders   Hemoglobin A1c   Urine Microalbumin w/creat. ratio     Other   Insomnia    Insomnia is well controlled  with current regimen and sleeping approximately 8 hours per night with no adverse side effects and feeling well rested in the morning. Continue current dosage of Ambien. [place] controlled substance database reviewed with no irregularities.      Relevant Medications   zolpidem (AMBIEN CR) 12.5 MG CR tablet   Muscle spasm of back    Symptoms and exam consistent with paraspinal muscular spasms secondary to landing on her back and cannot fully rule out contusion. Unlikely fracture given height of chair being just up from the ground. Treat conservatively with ice, heat, stretching, and start Robaxin as needed for muscle spasms. All the symptoms worsen or fail to improve with conservative treatment for further imaging if necessary.       Other Visit Diagnoses   None.      I have discontinued Ms. Muscarella's furosemide, levothyroxine, metFORMIN, and sulfamethoxazole-trimethoprim. I have also changed her telmisartan-hydrochlorothiazide. Additionally, I am having her start on methocarbamol. Lastly, I am having her maintain her omega-3 acid ethyl esters, b complex vitamins, and zolpidem.   Meds ordered this encounter  Medications  . zolpidem (AMBIEN CR) 12.5 MG CR tablet    Sig: Take 1 tablet (12.5 mg total) by mouth at bedtime as needed. for sleep    Dispense:  30 tablet    Refill:  0    Order Specific Question:   Supervising Provider    Answer:   Hillard DankerRAWFORD, ELIZABETH A [[id number]]  . telmisartan-hydrochlorothiazide (MICARDIS HCT) 80-25 MG tablet    Sig: Take 1 tablet by mouth daily.    Dispense:  90 tablet    Refill:  1    Needs office visit for additional refills or 90 day supply    Order Specific Question:   Supervising Provider    Answer:   Hillard DankerRAWFORD, ELIZABETH A [[id number]]  . methocarbamol (ROBAXIN) 500 MG tablet    Sig: Take 1 tablet (500 mg total) by mouth every 8 (eight) hours as needed for muscle spasms.    Dispense:  40 tablet    Refill:  0    Order Specific Question:   Supervising  Provider    Answer:   Hillard DankerRAWFORD, ELIZABETH A [[id number]]     Follow-up: Return in about 3 months (around 06/28/2016), or if symptoms worsen or fail to improve.  Jeanine Luzalone, Hosie Sharman, FNP

## 2016-03-28 NOTE — Assessment & Plan Note (Signed)
Blood pressure slightly elevated above goal 140/90 today with current regimen. Question patient compliance as she has had several refills go by without needing refills for medications. Indicates she has not taken her blood pressure medication today. Encouraged importance of maintaining a lower blood pressure to help prevent end organ damage in the future. Continue current dosage of telmisartan-hydrochlorothiazide. Encouraged to follow low-sodium diet. Continue to monitor blood pressure at home.

## 2016-03-28 NOTE — Patient Instructions (Signed)
Thank you for choosing [facility].  Summary/Instructions:  Continue to take your medications as prescribed.   Your prescription(s) have been submitted to your pharmacy or been printed and provided for you. Please take as directed and contact our office if you believe you are having problem(s) with the medication(s) or have any questions.  Ice / Moist heat 20 minutes every 2 hours as needed.  Stretching and exercises throughout the day.   Please stop by the lab on the lower level of the building for your blood work. Your results will be released to MyChart (or called to you) after review, usually within 72 hours after test completion. If any changes need to be made, you will be notified at that same time.  1. The lab is open from 7:30am to 5:30 pm Monday-Friday  2. No appointment is necessary  3. Fasting (if needed) is 6-8 hours after food and drink; black coffee and water  are okay   If your symptoms worsen or fail to improve, please contact our office for further instruction, or in case of emergency go directly to the emergency room at the closest medical facility.    Low Back Strain With Rehab A strain is an injury in which a tendon or muscle is torn. The muscles and tendons of the lower back are vulnerable to strains. However, these muscles and tendons are very strong and require a great force to be injured. Strains are classified into three categories. Grade 1 strains cause pain, but the tendon is not lengthened. Grade 2 strains include a lengthened ligament, due to the ligament being stretched or partially ruptured. With grade 2 strains there is still function, although the function may be decreased. Grade 3 strains involve a complete tear of the tendon or muscle, and function is usually impaired. SYMPTOMS   Pain in the lower back.  Pain that affects one side more than the other.  Pain that gets worse with movement and may be felt in the hip, buttocks, or back of the  thigh.  Muscle spasms of the muscles in the back.  Swelling along the muscles of the back.  Loss of strength of the back muscles.  Crackling sound (crepitation) when the muscles are touched. CAUSES  Lower back strains occur when a force is placed on the muscles or tendons that is greater than they can handle. Common causes of injury include:  Prolonged overuse of the muscle-tendon units in the lower back, usually from incorrect posture.  A single violent injury or force applied to the back. RISK INCREASES WITH:  Sports that involve twisting forces on the spine or a lot of bending at the waist (football, rugby, weightlifting, bowling, golf, tennis, speed skating, racquetball, swimming, running, gymnastics, diving).  Poor strength and flexibility.  Failure to warm up properly before activity.  Family history of lower back pain or disk disorders.  Previous back injury or surgery (especially fusion).  Poor posture with lifting, especially heavy objects.  Prolonged sitting, especially with poor posture. PREVENTION   Learn and use proper posture when sitting or lifting (maintain proper posture when sitting, lift using the knees and legs, not at the waist).  Warm up and stretch properly before activity.  Allow for adequate recovery between workouts.  Maintain physical fitness:  Strength, flexibility, and endurance.  Cardiovascular fitness. PROGNOSIS  If treated properly, lower back strains usually heal within 6 weeks. RELATED COMPLICATIONS   Recurring symptoms, resulting in a chronic problem.  Chronic inflammation, scarring, and partial  muscle-tendon tear.  Delayed healing or resolution of symptoms.  Prolonged disability. TREATMENT  Treatment first involves the use of ice and medicine, to reduce pain and inflammation. The use of strengthening and stretching exercises may help reduce pain with activity. These exercises may be performed at home or with a therapist.  Severe injuries may require referral to a therapist for further evaluation and treatment, such as ultrasound. Your caregiver may advise that you wear a back brace or corset, to help reduce pain and discomfort. Often, prolonged bed rest results in greater harm then benefit. Corticosteroid injections may be recommended. However, these should be reserved for the most serious cases. It is important to avoid using your back when lifting objects. At night, sleep on your back on a firm mattress with a pillow placed under your knees. If non-surgical treatment is unsuccessful, surgery may be needed.  MEDICATION   If pain medicine is needed, nonsteroidal anti-inflammatory medicines (aspirin and ibuprofen), or other minor pain relievers (acetaminophen), are often advised.  Do not take pain medicine for 7 days before surgery.  Prescription pain relievers may be given, if your caregiver thinks they are needed. Use only as directed and only as much as you need.  Ointments applied to the skin may be helpful.  Corticosteroid injections may be given by your caregiver. These injections should be reserved for the most serious cases, because they may only be given a certain number of times. HEAT AND COLD  Cold treatment (icing) should be applied for 10 to 15 minutes every 2 to 3 hours for inflammation and pain, and immediately after activity that aggravates your symptoms. Use ice packs or an ice massage.  Heat treatment may be used before performing stretching and strengthening activities prescribed by your caregiver, physical therapist, or athletic trainer. Use a heat pack or a warm water soak. SEEK MEDICAL CARE IF:   Symptoms get worse or do not improve in 2 to 4 weeks, despite treatment.  You develop numbness, weakness, or loss of bowel or bladder function.  New, unexplained symptoms develop. (Drugs used in treatment may produce side effects.) EXERCISES  RANGE OF MOTION (ROM) AND STRETCHING EXERCISES - Low  Back Strain Most people with lower back pain will find that their symptoms get worse with excessive bending forward (flexion) or arching at the lower back (extension). The exercises which will help resolve your symptoms will focus on the opposite motion.  Your physician, physical therapist or athletic trainer will help you determine which exercises will be most helpful to resolve your lower back pain. Do not complete any exercises without first consulting with your caregiver. Discontinue any exercises which make your symptoms worse until you speak to your caregiver.  If you have pain, numbness or tingling which travels down into your buttocks, leg or foot, the goal of the therapy is for these symptoms to move closer to your back and eventually resolve. Sometimes, these leg symptoms will get better, but your lower back pain may worsen. This is typically an indication of progress in your rehabilitation. Be very alert to any changes in your symptoms and the activities in which you participated in the 24 hours prior to the change. Sharing this information with your caregiver will allow him/her to most efficiently treat your condition.  These exercises may help you when beginning to rehabilitate your injury. Your symptoms may resolve with or without further involvement from your physician, physical therapist or athletic trainer. While completing these exercises, remember:  Restoring tissue flexibility  helps normal motion to return to the joints. This allows healthier, less painful movement and activity.  An effective stretch should be held for at least 30 seconds.  A stretch should never be painful. You should only feel a gentle lengthening or release in the stretched tissue. FLEXION RANGE OF MOTION AND STRETCHING EXERCISES: STRETCH - Flexion, Single Knee to Chest   Lie on a firm bed or floor with both legs extended in front of you.  Keeping one leg in contact with the floor, bring your opposite knee to  your chest. Hold your leg in place by either grabbing behind your thigh or at your knee.  Pull until you feel a gentle stretch in your lower back. Hold __________ seconds.  Slowly release your grasp and repeat the exercise with the opposite side. Repeat __________ times. Complete this exercise __________ times per day.  STRETCH - Flexion, Double Knee to Chest   Lie on a firm bed or floor with both legs extended in front of you.  Keeping one leg in contact with the floor, bring your opposite knee to your chest.  Tense your stomach muscles to support your back and then lift your other knee to your chest. Hold your legs in place by either grabbing behind your thighs or at your knees.  Pull both knees toward your chest until you feel a gentle stretch in your lower back. Hold __________ seconds.  Tense your stomach muscles and slowly return one leg at a time to the floor. Repeat __________ times. Complete this exercise __________ times per day.  STRETCH - Low Trunk Rotation  Lie on a firm bed or floor. Keeping your legs in front of you, bend your knees so they are both pointed toward the ceiling and your feet are flat on the floor.  Extend your arms out to the side. This will stabilize your upper body by keeping your shoulders in contact with the floor.  Gently and slowly drop both knees together to one side until you feel a gentle stretch in your lower back. Hold for __________ seconds.  Tense your stomach muscles to support your lower back as you bring your knees back to the starting position. Repeat the exercise to the other side. Repeat __________ times. Complete this exercise __________ times per day  EXTENSION RANGE OF MOTION AND FLEXIBILITY EXERCISES: STRETCH - Extension, Prone on Elbows   Lie on your stomach on the floor, a bed will be too soft. Place your palms about shoulder width apart and at the height of your head.  Place your elbows under your shoulders. If this is too  painful, stack pillows under your chest.  Allow your body to relax so that your hips drop lower and make contact more completely with the floor.  Hold this position for __________ seconds.  Slowly return to lying flat on the floor. Repeat __________ times. Complete this exercise __________ times per day.  RANGE OF MOTION - Extension, Prone Press Ups  Lie on your stomach on the floor, a bed will be too soft. Place your palms about shoulder width apart and at the height of your head.  Keeping your back as relaxed as possible, slowly straighten your elbows while keeping your hips on the floor. You may adjust the placement of your hands to maximize your comfort. As you gain motion, your hands will come more underneath your shoulders.  Hold this position __________ seconds.  Slowly return to lying flat on the floor. Repeat __________ times. Complete  this exercise __________ times per day.  RANGE OF MOTION- Quadruped, Neutral Spine   Assume a hands and knees position on a firm surface. Keep your hands under your shoulders and your knees under your hips. You may place padding under your knees for comfort.  Drop your head and point your tail bone toward the ground below you. This will round out your lower back like an angry cat. Hold this position for __________ seconds.  Slowly lift your head and release your tail bone so that your back sags into a large arch, like an old horse.  Hold this position for __________ seconds.  Repeat this until you feel limber in your lower back.  Now, find your "sweet spot." This will be the most comfortable position somewhere between the two previous positions. This is your neutral spine. Once you have found this position, tense your stomach muscles to support your lower back.  Hold this position for __________ seconds. Repeat __________ times. Complete this exercise __________ times per day.  STRENGTHENING EXERCISES - Low Back Strain These exercises may  help you when beginning to rehabilitate your injury. These exercises should be done near your "sweet spot." This is the neutral, low-back arch, somewhere between fully rounded and fully arched, that is your least painful position. When performed in this safe range of motion, these exercises can be used for people who have either a flexion or extension based injury. These exercises may resolve your symptoms with or without further involvement from your physician, physical therapist or athletic trainer. While completing these exercises, remember:   Muscles can gain both the endurance and the strength needed for everyday activities through controlled exercises.  Complete these exercises as instructed by your physician, physical therapist or athletic trainer. Increase the resistance and repetitions only as guided.  You may experience muscle soreness or fatigue, but the pain or discomfort you are trying to eliminate should never worsen during these exercises. If this pain does worsen, stop and make certain you are following the directions exactly. If the pain is still present after adjustments, discontinue the exercise until you can discuss the trouble with your caregiver. STRENGTHENING - Deep Abdominals, Pelvic Tilt  Lie on a firm bed or floor. Keeping your legs in front of you, bend your knees so they are both pointed toward the ceiling and your feet are flat on the floor.  Tense your lower abdominal muscles to press your lower back into the floor. This motion will rotate your pelvis so that your tail bone is scooping upwards rather than pointing at your feet or into the floor.  With a gentle tension and even breathing, hold this position for __________ seconds. Repeat __________ times. Complete this exercise __________ times per day.  STRENGTHENING - Abdominals, Crunches   Lie on a firm bed or floor. Keeping your legs in front of you, bend your knees so they are both pointed toward the ceiling and  your feet are flat on the floor. Cross your arms over your chest.  Slightly tip your chin down without bending your neck.  Tense your abdominals and slowly lift your trunk high enough to just clear your shoulder blades. Lifting higher can put excessive stress on the lower back and does not further strengthen your abdominal muscles.  Control your return to the starting position. Repeat __________ times. Complete this exercise __________ times per day.  STRENGTHENING - Quadruped, Opposite UE/LE Lift   Assume a hands and knees position on a firm surface. Keep  your hands under your shoulders and your knees under your hips. You may place padding under your knees for comfort.  Find your neutral spine and gently tense your abdominal muscles so that you can maintain this position. Your shoulders and hips should form a rectangle that is parallel with the floor and is not twisted.  Keeping your trunk steady, lift your right hand no higher than your shoulder and then your left leg no higher than your hip. Make sure you are not holding your breath. Hold this position __________ seconds.  Continuing to keep your abdominal muscles tense and your back steady, slowly return to your starting position. Repeat with the opposite arm and leg. Repeat __________ times. Complete this exercise __________ times per day.  STRENGTHENING - Lower Abdominals, Double Knee Lift  Lie on a firm bed or floor. Keeping your legs in front of you, bend your knees so they are both pointed toward the ceiling and your feet are flat on the floor.  Tense your abdominal muscles to brace your lower back and slowly lift both of your knees until they come over your hips. Be certain not to hold your breath.  Hold __________ seconds. Using your abdominal muscles, return to the starting position in a slow and controlled manner. Repeat __________ times. Complete this exercise __________ times per day.  POSTURE AND BODY MECHANICS  CONSIDERATIONS - Low Back Strain Keeping correct posture when sitting, standing or completing your activities will reduce the stress put on different body tissues, allowing injured tissues a chance to heal and limiting painful experiences. The following are general guidelines for improved posture. Your physician or physical therapist will provide you with any instructions specific to your needs. While reading these guidelines, remember:  The exercises prescribed by your provider will help you have the flexibility and strength to maintain correct postures.  The correct posture provides the best environment for your joints to work. All of your joints have less wear and tear when properly supported by a spine with good posture. This means you will experience a healthier, less painful body.  Correct posture must be practiced with all of your activities, especially prolonged sitting and standing. Correct posture is as important when doing repetitive low-stress activities (typing) as it is when doing a single heavy-load activity (lifting). RESTING POSITIONS Consider which positions are most painful for you when choosing a resting position. If you have pain with flexion-based activities (sitting, bending, stooping, squatting), choose a position that allows you to rest in a less flexed posture. You would want to avoid curling into a fetal position on your side. If your pain worsens with extension-based activities (prolonged standing, working overhead), avoid resting in an extended position such as sleeping on your stomach. Most people will find more comfort when they rest with their spine in a more neutral position, neither too rounded nor too arched. Lying on a non-sagging bed on your side with a pillow between your knees, or on your back with a pillow under your knees will often provide some relief. Keep in mind, being in any one position for a prolonged period of time, no matter how correct your posture, can  still lead to stiffness. PROPER SITTING POSTURE In order to minimize stress and discomfort on your spine, you must sit with correct posture. Sitting with good posture should be effortless for a healthy body. Returning to good posture is a gradual process. Many people can work toward this most comfortably by using various supports until they  have the flexibility and strength to maintain this posture on their own. When sitting with proper posture, your ears will fall over your shoulders and your shoulders will fall over your hips. You should use the back of the chair to support your upper back. Your lower back will be in a neutral position, just slightly arched. You may place a small pillow or folded towel at the base of your lower back for support.  When working at a desk, create an environment that supports good, upright posture. Without extra support, muscles tire, which leads to excessive strain on joints and other tissues. Keep these recommendations in mind: CHAIR:  A chair should be able to slide under your desk when your back makes contact with the back of the chair. This allows you to work closely.  The chair's height should allow your eyes to be level with the upper part of your monitor and your hands to be slightly lower than your elbows. BODY POSITION  Your feet should make contact with the floor. If this is not possible, use a foot rest.  Keep your ears over your shoulders. This will reduce stress on your neck and lower back. INCORRECT SITTING POSTURES  If you are feeling tired and unable to assume a healthy sitting posture, do not slouch or slump. This puts excessive strain on your back tissues, causing more damage and pain. Healthier options include:  Using more support, like a lumbar pillow.  Switching tasks to something that requires you to be upright or walking.  Talking a brief walk.  Lying down to rest in a neutral-spine position. PROLONGED STANDING WHILE SLIGHTLY LEANING  FORWARD  When completing a task that requires you to lean forward while standing in one place for a long time, place either foot up on a stationary 2-4 inch high object to help maintain the best posture. When both feet are on the ground, the lower back tends to lose its slight inward curve. If this curve flattens (or becomes too large), then the back and your other joints will experience too much stress, tire more quickly, and can cause pain. CORRECT STANDING POSTURES Proper standing posture should be assumed with all daily activities, even if they only take a few moments, like when brushing your teeth. As in sitting, your ears should fall over your shoulders and your shoulders should fall over your hips. You should keep a slight tension in your abdominal muscles to brace your spine. Your tailbone should point down to the ground, not behind your body, resulting in an over-extended swayback posture.  INCORRECT STANDING POSTURES  Common incorrect standing postures include a forward head, locked knees and/or an excessive swayback. WALKING Walk with an upright posture. Your ears, shoulders and hips should all line-up. PROLONGED ACTIVITY IN A FLEXED POSITION When completing a task that requires you to bend forward at your waist or lean over a low surface, try to find a way to stabilize 3 out of 4 of your limbs. You can place a hand or elbow on your thigh or rest a knee on the surface you are reaching across. This will provide you more stability so that your muscles do not fatigue as quickly. By keeping your knees relaxed, or slightly bent, you will also reduce stress across your lower back. CORRECT LIFTING TECHNIQUES DO :   Assume a wide stance. This will provide you more stability and the opportunity to get as close as possible to the object which you are lifting.  Tense  your abdominals to brace your spine. Bend at the knees and hips. Keeping your back locked in a neutral-spine position, lift using your  leg muscles. Lift with your legs, keeping your back straight.  Test the weight of unknown objects before attempting to lift them.  Try to keep your elbows locked down at your sides in order get the best strength from your shoulders when carrying an object.  Always ask for help when lifting heavy or awkward objects. INCORRECT LIFTING TECHNIQUES DO NOT:   Lock your knees when lifting, even if it is a small object.  Bend and twist. Pivot at your feet or move your feet when needing to change directions.  Assume that you can safely pick up even a paper clip without proper posture.   This information is not intended to replace advice given to you by your health care provider. Make sure you discuss any questions you have with your health care provider.   Document Released: 07/30/2005 Document Revised: 08/20/2014 Document Reviewed: 11/11/2008 [employer] Interactive Patient Education 2016 [employer]   Mid-Back Strain With Rehab  A strain is an injury in which a tendon or muscle is torn. The muscles and tendons of the mid-back are vulnerable to strains. However, these muscles and tendons are very strong and require a great force to be injured. The muscles of the mid-back are responsible for stabilizing the spinal column, as well as spinal twisting (rotation). Strains are classified into three categories. Grade 1 strains cause pain, but the tendon is not lengthened. Grade 2 strains include a lengthened ligament, due to the ligament being stretched or partially ruptured. With grade 2 strains there is still function, although the function may be decreased. Grade 3 strains involve a complete tear of the tendon or muscle, and function is usually impaired. SYMPTOMS   Pain in the middle of the back.  Pain that may affect only one side, and is worse with movement.  Muscle spasms, and often swelling in the back.  Loss of strength of the back muscles.  Crackling sound (crepitation) when the muscles  are touched. CAUSES  Mid-back strains occur when a force is placed on the muscles or tendons that is greater than they can handle. Common causes of injury include:  Ongoing overuse of the muscle-tendon units in the middle back, usually from incorrect body posture.  A single violent injury or force applied to the back. RISK INCREASES WITH:  Sports that involve twisting forces on the spine or a lot of bending at the waist (football, rugby, weightlifting, bowling, golf, tennis, speed skating, racquetball, swimming, running, gymnastics, diving).  Poor strength and flexibility.  Failure to warm up properly before activity.  Family history of low back pain or disk disorders.  Previous back injury or surgery (especially fusion). PREVENTION  Learn and use proper sports technique.  Warm up and stretch properly before activity.  Allow for adequate recovery between workouts.  Maintain physical fitness:  Strength, flexibility, and endurance.  Cardiovascular fitness. PROGNOSIS  If treated properly, mid-back strains usually heal within 6 weeks. RELATED COMPLICATIONS   Frequently recurring symptoms, resulting in a chronic problem. Properly treating the problem the first time decreases frequency of recurrence.  Chronic inflammation, scarring, and partial muscle-tendon tear.  Delayed healing or resolution of symptoms, especially if activity is resumed too soon.  Prolonged disability. TREATMENT Treatment first involves the use of ice and medicine, to reduce pain and inflammation. As the pain begins to subside, you may begin strengthening and stretching  exercises to improve body posture and sport technique. These exercises may be performed at home or with a therapist. Severe injuries may require referral to a therapist for further evaluation and treatment, such as ultrasound. Corticosteroid injections may be given to help reduce inflammation. Biofeedback (watching monitors of your body  processes) and psychotherapy may also be prescribed. Prolonged bed rest is felt to do more harm than good. Massage may help break the muscle spasms. Sometimes, an injection of cortisone, with or without local anesthetics, may be given to help relieve the pain and spasms. MEDICATION   If pain medicine is needed, nonsteroidal anti-inflammatory medicines (aspirin and ibuprofen), or other minor pain relievers (acetaminophen), are often advised.  Do not take pain medicine for 7 days before surgery.  Prescription pain relievers may be given, if your caregiver thinks they are needed. Use only as directed and only as much as you need.  Ointments applied to the skin may be helpful.  Corticosteroid injections may be given by your caregiver. These injections should be reserved for the most serious cases, because they may only be given a certain number of times. HEAT AND COLD:   Cold treatment (icing) should be applied for 10 to 15 minutes every 2 to 3 hours for inflammation and pain, and immediately after activity that aggravates your symptoms. Use ice packs or an ice massage.  Heat treatment may be used before performing stretching and strengthening activities prescribed by your caregiver, physical therapist, or athletic trainer. Use a heat pack or a warm water soak. SEEK IMMEDIATE MEDICAL CARE IF:  Symptoms get worse or do not improve in 2 to 4 weeks, despite treatment.  You develop numbness, weakness, or loss of bowel or bladder function.  New, unexplained symptoms develop. (Drugs used in treatment may produce side effects.) EXERCISES RANGE OF MOTION (ROM) AND STRETCHING EXERCISES - Mid-Back Strain These exercises may help you when beginning to rehabilitate your injury. In order to successfully resolve your symptoms, you must improve your posture. These exercises are designed to help reduce the forward-head and rounded-shoulder posture which contributes to this condition. Your symptoms may resolve  with or without further involvement from your physician, physical therapist or athletic trainer. While completing these exercises, remember:   Restoring tissue flexibility helps normal motion to return to the joints. This allows healthier, less painful movement and activity.  An effective stretch should be held for at least 30 seconds.  A stretch should never be painful. You should only feel a gentle lengthening or release in the stretched tissue. STRETCH - Axial Extension  Stand or sit on a firm surface. Assume a good posture: chest up, shoulders drawn back, stomach muscles slightly tense, knees unlocked (if standing) and feet hip width apart.  Slowly retract your chin, so your head slides back and your chin slightly lowers. Continue to look straight ahead.  You should feel a gentle stretch in the back of your head. Be certain not to feel an aggressive stretch since this can cause headaches later.  Hold for __________ seconds. Repeat __________ times. Complete this exercise __________ times per day. RANGE OF MOTION- Upper Thoracic Extension  Sit on a firm chair with a high back. Assume a good posture: chest up, shoulders drawn back, abdominal muscles slightly tense, and feet hip width apart. Place a small pillow or folded towel in the curve of your lower back, if you are having difficulty maintaining good posture.  Gently brace your neck with your hands, allowing your arms to  rest on your chest.  Continue to support your neck and slowly extend your back over the chair. You will feel a stretch across your upper back.  Hold __________ seconds. Slowly return to the starting position. Repeat __________ times. Complete this exercise __________ times per day. RANGE OF MOTION- Mid-Thoracic Extension  Roll a towel so that it is about 4 inches in diameter.  Position the towel lengthwise. Lay on the towel so that your spine, but not your shoulder blades, are supported.  You should feel your  mid-back arching toward the floor. To increase the stretch, extend your arms away from your body.  Hold for __________ seconds. Repeat exercise __________ times, __________ times per day. STRENGTHENING EXERCISES - Mid-Back Strain These exercises may help you when beginning to rehabilitate your injury. They may resolve your symptoms with or without further involvement from your physician, physical therapist or athletic trainer. While completing these exercises, remember:   Muscles can gain both the endurance and the strength needed for everyday activities through controlled exercises.  Complete these exercises as instructed by your physician, physical therapist or athletic trainer. Increase the resistance and repetitions only as guided by your caregiver.  You may experience muscle soreness or fatigue, but the pain or discomfort you are trying to eliminate should never worsen during these exercises. If this pain does worsen, stop and make certain you are following the directions exactly. If the pain is still present after adjustments, discontinue the exercise until you can discuss the trouble with your caregiver. STRENGTHENING - Quadruped, Opposite UE/LE Lift  Assume a hands and knees position on a firm surface. Keep your hands under your shoulders and your knees under your hips. You may place padding under your knees for comfort.  Find your neutral spine and gently tense your abdominal muscles so that you can maintain this position. Your shoulders and hips should form a rectangle that is parallel with the floor and is not twisted.  Keeping your trunk steady, lift your right hand no higher than your shoulder and then your left leg no higher than your hip. Make sure you are not holding your breath. Hold this position __________ seconds.  Continuing to keep your abdominal muscles tense and your back steady, slowly return to your starting position. Repeat with the opposite arm and leg. Repeat  __________ times. Complete this exercise __________ times per day.  STRENGTH - Shoulder Extensors  Secure a rubber exercise band or tubing to a fixed object (table, pole) so that it is at the height of your shoulders when you are either standing, or sitting on a firm armless chair.  With a thumbs-up grip, grasp an end of the band in each hand. Straighten your elbows and lift your hands straight in front of you at shoulder height. Step back away from the secured end of band, until it becomes tense.  Squeezing your shoulder blades together, pull your hands down to the sides of your thighs. Do not allow your hands to go behind you.  Hold for __________ seconds. Slowly ease the tension on the band, as you reverse the directions and return to the starting position. Repeat __________ times. Complete this exercise __________ times per day.  STRENGTH - Horizontal Abductors Choose one of the two positions to complete this exercise. Prone: lying on stomach:  Lie on your stomach on a firm surface so that your right / left arm overhangs the edge. Rest your forehead on your opposite forearm. With your palm facing the floor  and your elbow straight, hold a __________ weight in your hand.  Squeeze your right / left shoulder blade to your mid-back spine and then slowly raise your arm to the height of the bed.  Hold for __________ seconds. Slowly reverse the directions and return to the starting position, controlling the weight as you lower your arm. Repeat __________ times. Complete this exercise __________ times per day. Standing:   Secure a rubber exercise band or tubing, so that it is at the height of your shoulders when you are either standing, or sitting on a firm armless chair.  Grasp an end of the band in each hand and have your palms face each other. Straighten your elbows and lift your hands straight in front of you at shoulder height. Step back away from the secured end of band, until it becomes  tense.  Squeeze your shoulder blades together. Keeping your elbows locked and your hands at shoulder height, spread your arms apart, forming a "T" shape with your body. Hold __________ seconds. Slowly ease the tension on the band, as you reverse the directions and return to the starting position. Repeat __________ times. Complete this exercise __________ times per day. STRENGTH - Scapular Retractors and External Rotators, Rowing  Secure a rubber exercise band or tubing, so that it is at the height of your shoulders when you are either standing, or sitting on a firm armless chair.  With a palm-down grip, grasp an end of the band in each hand. Straighten your elbows and lift your hands straight in front of you at shoulder height. Step back away from the secured end of band, until it becomes tense.  Step 1: Squeeze your shoulder blades together. Bending your elbows, draw your hands to your chest as if you are rowing a boat. At the end of this motion, your hands and elbow should be at shoulder height and your elbows should be out to your sides.  Step 2: Rotate your shoulder to raise your hands above your head. Your forearms should be vertical and your upper arms should be horizontal.  Hold for __________ seconds. Slowly ease the tension on the band, as you reverse the directions and return to the starting position. Repeat __________ times. Complete this exercise __________ times per day.  POSTURE AND BODY MECHANICS CONSIDERATIONS - Mid-Back Strain Keeping correct posture when sitting, standing or completing your activities will reduce the stress put on different body tissues, allowing injured tissues a chance to heal and limiting painful experiences. The following are general guidelines for improved posture. Your physician or physical therapist will provide you with any instructions specific to your needs. While reading these guidelines, remember:  The exercises prescribed by your provider will help  you have the flexibility and strength to maintain correct postures.  The correct posture provides the best environment for your joints to work. All of your joints have less wear and tear when properly supported by a spine with good posture. This means you will experience a healthier, less painful body.  Correct posture must be practiced with all of your activities, especially prolonged sitting and standing. Correct posture is as important when doing repetitive low-stress activities (typing) as it is when doing a single heavy-load activity (lifting). PROPER SITTING POSTURE In order to minimize stress and discomfort on your spine, you must sit with correct posture. Sitting with good posture should be effortless for a healthy body. Returning to good posture is a gradual process. Many people can work toward this most  comfortably by using various supports until they have the flexibility and strength to maintain this posture on their own. When sitting with proper posture, your ears will fall over your shoulders and your shoulders will fall over your hips. You should use the back of the chair to support your upper back. Your lower back will be in a neutral position, just slightly arched. You may place a small pillow or folded towel at the base of your low back for  support.  When working at a desk, create an environment that supports good, upright posture. Without extra support, muscles fatigue and lead to excessive strain on joints and other tissues. Keep these recommendations in mind: CHAIR:  A chair should be able to slide under your desk when your back makes contact with the back of the chair. This allows you to work closely.  The chair's height should allow your eyes to be level with the upper part of your monitor and your hands to be slightly lower than your elbows. BODY POSITION  Your feet should make contact with the floor. If this is not possible, use a foot rest.  Keep your ears over your  shoulders. This will reduce stress on your neck and lower back. INCORRECT SITTING POSTURES If you are feeling tired and unable to assume a healthy sitting posture, do not slouch or slump. This puts excessive strain on your back tissues, causing more damage and pain. Healthier options include:  Using more support, like a lumbar pillow.  Switching tasks to something that requires you to be upright or walking.  Talking a brief walk.  Lying down to rest in a neutral-spine position. CORRECT STANDING POSTURES Proper standing posture should be assumed with all daily activities, even if they only take a few moments, like when brushing your teeth. As in sitting, your ears should fall over your shoulders and your shoulders should fall over your hips. You should keep a slight tension in your abdominal muscles to brace your spine. Your tailbone should point down to the ground, not behind your body, resulting in an over-extended swayback posture.  INCORRECT STANDING POSTURES Common incorrect standing postures include a forward head, locked knees, and an excessive swayback. WALKING Walk with an upright posture. Your ears, shoulders and hips should all line-up. CORRECT LIFTING TECHNIQUES DO :   Assume a wide stance. This will provide you more stability and the opportunity to get as close as possible to the object which you are lifting.  Tense your abdominals to brace your spine. Bend at the knees and hips. Keeping your back locked in a neutral-spine position, lift using your leg muscles. Lift with your legs, keeping your back straight.  Test the weight of unknown objects before attempting to lift them.  Try to keep your elbows locked down at your sides in order get the best strength from your shoulders when carrying an object.  Always ask for help when lifting heavy or awkward objects. INCORRECT LIFTING TECHNIQUES DO NOT:   Lock your knees when lifting, even if it is a small object.  Bend and  twist. Pivot at your feet or move your feet when needing to change directions.  Assume that you can safely pick up even a paperclip without proper posture.   This information is not intended to replace advice given to you by your health care provider. Make sure you discuss any questions you have with your health care provider.   Document Released: 07/30/2005 Document Revised: 12/14/2014 Document Reviewed: 11/11/2008  Chartered certified accountant Patient Education [employer].

## 2016-03-28 NOTE — Assessment & Plan Note (Signed)
Symptoms and exam consistent with paraspinal muscular spasms secondary to landing on her back and cannot fully rule out contusion. Unlikely fracture given height of chair being just up from the ground. Treat conservatively with ice, heat, stretching, and start Robaxin as needed for muscle spasms. All the symptoms worsen or fail to improve with conservative treatment for further imaging if necessary.

## 2016-03-28 NOTE — Assessment & Plan Note (Signed)
Insomnia is well controlled with current regimen and sleeping approximately 8 hours per night with no adverse side effects and feeling well rested in the morning. Continue current dosage of Ambien. [place] controlled substance database reviewed with no irregularities.

## 2016-04-04 ENCOUNTER — Other Ambulatory Visit (INDEPENDENT_AMBULATORY_CARE_PROVIDER_SITE_OTHER): Payer: BLUE CROSS/BLUE SHIELD

## 2016-04-04 DIAGNOSIS — E119 Type 2 diabetes mellitus without complications: Secondary | ICD-10-CM

## 2016-04-04 DIAGNOSIS — E039 Hypothyroidism, unspecified: Secondary | ICD-10-CM | POA: Diagnosis not present

## 2016-04-04 LAB — HEMOGLOBIN A1C: Hgb A1c MFr Bld: 6.1 % (ref 4.6–6.5)

## 2016-04-04 LAB — TSH: TSH: 3.11 u[IU]/mL (ref 0.35–4.50)

## 2016-04-04 LAB — MICROALBUMIN / CREATININE URINE RATIO
CREATININE, U: 156.4 mg/dL
Microalb Creat Ratio: 0.4 mg/g (ref 0.0–30.0)
Microalb, Ur: <0.7 mg/dL (ref 0.0–1.9)

## 2016-04-05 ENCOUNTER — Telehealth: Payer: Self-pay | Admitting: Family

## 2016-04-05 ENCOUNTER — Encounter: Payer: Self-pay | Admitting: Family

## 2016-04-05 DIAGNOSIS — R8290 Unspecified abnormal findings in urine: Secondary | ICD-10-CM

## 2016-04-05 NOTE — Telephone Encounter (Signed)
Labs placed. Pt aware.

## 2016-04-05 NOTE — Telephone Encounter (Signed)
Pt feels like she has a uti and this maybe where her back pain is coming from.  Her urine is dark and cloudy.  Can she come to the lab today to leave another urine sample to test for uti?

## 2016-04-11 ENCOUNTER — Other Ambulatory Visit (INDEPENDENT_AMBULATORY_CARE_PROVIDER_SITE_OTHER): Payer: BLUE CROSS/BLUE SHIELD

## 2016-04-11 DIAGNOSIS — R8290 Unspecified abnormal findings in urine: Secondary | ICD-10-CM

## 2016-04-11 DIAGNOSIS — R8299 Other abnormal findings in urine: Secondary | ICD-10-CM

## 2016-04-11 LAB — URINALYSIS
BILIRUBIN URINE: NEGATIVE
HGB URINE DIPSTICK: NEGATIVE
Ketones, ur: NEGATIVE
LEUKOCYTES UA: NEGATIVE
NITRITE: NEGATIVE
Specific Gravity, Urine: 1.02 (ref 1.000–1.030)
Total Protein, Urine: NEGATIVE
Urine Glucose: NEGATIVE
Urobilinogen, UA: 0.2 (ref 0.0–1.0)
pH: 6 (ref 5.0–8.0)

## 2016-04-13 ENCOUNTER — Encounter: Payer: Self-pay | Admitting: Family

## 2016-04-13 LAB — URINE CULTURE

## 2016-04-24 ENCOUNTER — Other Ambulatory Visit: Payer: Self-pay | Admitting: Family

## 2016-04-24 DIAGNOSIS — G4700 Insomnia, unspecified: Secondary | ICD-10-CM

## 2016-04-24 NOTE — Telephone Encounter (Signed)
Last refill was 03/28/16 

## 2016-05-23 ENCOUNTER — Other Ambulatory Visit: Payer: Self-pay | Admitting: Family

## 2016-05-23 DIAGNOSIS — G4700 Insomnia, unspecified: Secondary | ICD-10-CM

## 2016-05-24 NOTE — Telephone Encounter (Signed)
Rx faxed

## 2016-05-24 NOTE — Telephone Encounter (Signed)
Last refill was 04/24/16 

## 2016-06-26 ENCOUNTER — Other Ambulatory Visit: Payer: Self-pay | Admitting: Family

## 2016-06-26 DIAGNOSIS — G4700 Insomnia, unspecified: Secondary | ICD-10-CM

## 2016-06-27 NOTE — Telephone Encounter (Signed)
Last refill was 05/24/16 

## 2016-06-28 ENCOUNTER — Other Ambulatory Visit: Payer: Self-pay | Admitting: Family

## 2016-06-28 DIAGNOSIS — F5101 Primary insomnia: Secondary | ICD-10-CM

## 2016-06-28 MED ORDER — ZOLPIDEM TARTRATE ER 12.5 MG PO TBCR: 12.5000 mg | EXTENDED_RELEASE_TABLET | Freq: Every evening | ORAL | 1 refills | Status: DC | PRN

## 2016-06-28 MED ORDER — METHOCARBAMOL 500 MG PO TABS
500.0000 mg | ORAL_TABLET | Freq: Three times a day (TID) | ORAL | 0 refills | Status: DC | PRN
Start: 2016-06-28 — End: 2016-12-16

## 2016-06-28 NOTE — Telephone Encounter (Signed)
Rx faxed

## 2016-07-10 ENCOUNTER — Encounter: Payer: Self-pay | Admitting: Family

## 2016-07-10 ENCOUNTER — Ambulatory Visit (INDEPENDENT_AMBULATORY_CARE_PROVIDER_SITE_OTHER): Payer: BLUE CROSS/BLUE SHIELD | Admitting: Family

## 2016-07-10 ENCOUNTER — Ambulatory Visit (INDEPENDENT_AMBULATORY_CARE_PROVIDER_SITE_OTHER)
Admission: RE | Admit: 2016-07-10 | Discharge: 2016-07-10 | Disposition: A | Discharge status: Home / Self Care | Payer: BLUE CROSS/BLUE SHIELD | Source: Ambulatory Visit | Attending: Family | Admitting: Family

## 2016-07-10 DIAGNOSIS — R059 Cough, unspecified: Secondary | ICD-10-CM

## 2016-07-10 DIAGNOSIS — R05 Cough: Secondary | ICD-10-CM

## 2016-07-10 MED ORDER — TRAMADOL HCL 50 MG PO TABS: 50.0000 mg | ORAL_TABLET | Freq: Three times a day (TID) | ORAL | 0 refills | Status: DC | PRN

## 2016-07-10 MED ORDER — PROMETHAZINE-DM 6.25-15 MG/5ML PO SYRP: 5.0000 mL | ORAL_SOLUTION | Freq: Four times a day (QID) | ORAL | 0 refills | Status: DC | PRN

## 2016-07-10 MED ORDER — ALBUTEROL SULFATE HFA 108 (90 BASE) MCG/ACT IN AERS: 2.0000 | INHALATION_SPRAY | Freq: Four times a day (QID) | RESPIRATORY_TRACT | 0 refills | Status: DC | PRN

## 2016-07-10 MED ORDER — KETOROLAC TROMETHAMINE 60 MG/2ML IM SOLN
60.0000 mg | Freq: Once | INTRAMUSCULAR | Status: AC
  Administered 2016-07-10: 60 mg via INTRAMUSCULAR

## 2016-07-10 MED ORDER — AZITHROMYCIN 250 MG PO TABS: ORAL_TABLET | ORAL | 0 refills | Status: DC

## 2016-07-10 NOTE — Assessment & Plan Note (Signed)
Symptoms and exam are consistent with bronchitis with concern for possible pneumonia. Obtain x-ray. In office injection of Toradol provided for general body aches per patient request. Start azithromycin. Start promethazine DM as needed for cough and sleep. Start albuterol as needed for wheezing and shortness of breath. Continue over-the-counter medications as needed for symptom relief and supportive care. Follow-up if symptoms worsen or do not improve.

## 2016-07-10 NOTE — Progress Notes (Signed)
Subjective:    Patient ID: Catherine Baker, female    DOB: 17-Jul-1951, 65 y.o.   MRN: [id number]  Chief Complaint  Patient presents with  . Sore Throat    sore throat, congestion, leg pain in both legs, throat burns, fever, fell on friday got sick on thursday has body soreness    HPI:  Catherine Baker is a 65 y.o. female who  has a past medical history of Diabetes mellitus without complication (HCC) and Hypertension. and presents today for an office visit.  This is a new problem. Associated symptoms of sore throat, chills, dry cough, headaches, congestion, and fever have been going on for approximately 3-4 days. Temperature max of 101 with the last being on Sunday. Modifying factors include ibuprofen, Robutussin, Nyquil, Mucinex, and Vicks vapor rub. Does have some mild shortness of breath and chest tightness. Symptoms are generally worse at night with the severity of the cough left to affect her sleep. She does have a poor appetite. Overall course of symptoms appears to be worsening.   Allergies  Allergen Reactions  . Codeine Hives and Nausea Only      Outpatient Medications Prior to Visit  Medication Sig Dispense Refill  . b complex vitamins tablet Take 1 tablet by mouth daily.    . methocarbamol (ROBAXIN) 500 MG tablet Take 1 tablet (500 mg total) by mouth every 8 (eight) hours as needed for muscle spasms. 40 tablet 0  . omega-3 acid ethyl esters (LOVAZA) 1 G capsule Take 1 g by mouth daily.    Marland Kitchen. telmisartan-hydrochlorothiazide (MICARDIS HCT) 80-25 MG tablet Take 1 tablet by mouth daily. 90 tablet 1  . zolpidem (AMBIEN CR) 12.5 MG CR tablet Take 1 tablet (12.5 mg total) by mouth at bedtime as needed. for sleep 30 tablet 1   No facility-administered medications prior to visit.       Past Surgical History:  Procedure Laterality Date  . ABDOMINAL HYSTERECTOMY    . TUBAL LIGATION        Past Medical History:  Diagnosis Date  . Diabetes mellitus without complication  (HCC)   . Hypertension       Review of Systems  Constitutional: Positive for chills and fever.  HENT: Positive for congestion and sore throat. Negative for sinus pain and sinus pressure.   Respiratory: Positive for choking and shortness of breath.   Neurological: Positive for headaches.      Objective:    BP 140/82 (BP Location: Left Arm, Patient Position: Sitting, Cuff Size: Large)   Pulse 69   Temp 98.1 F (36.7 C) (Oral)   Resp 16   Ht 5\' 7"  (1.702 m)   Wt 254 lb (115.2 kg)   SpO2 96%   BMI 39.78 kg/m  Nursing note and vital signs reviewed.  Physical Exam  Constitutional: She is oriented to person, place, and time. She appears well-developed and well-nourished.  Non-toxic appearance. She does not have a sickly appearance. She appears ill. No distress.  HENT:  Right Ear: Hearing, tympanic membrane, external ear and ear canal normal.  Left Ear: Hearing, tympanic membrane, external ear and ear canal normal.  Nose: Right sinus exhibits no maxillary sinus tenderness and no frontal sinus tenderness. Left sinus exhibits no maxillary sinus tenderness and no frontal sinus tenderness.  Mouth/Throat: Uvula is midline, oropharynx is clear and moist and mucous membranes are normal.  Cardiovascular: Normal rate, regular rhythm, normal heart sounds and intact distal pulses.   Pulmonary/Chest: Effort normal. No respiratory distress. She  has wheezes. She has no rales. She exhibits no tenderness.  Neurological: She is alert and oriented to person, place, and time.  Skin: Skin is warm and dry.  Psychiatric: She has a normal mood and affect. Her behavior is normal. Judgment and thought content normal.       Assessment & Plan:   Problem List Items Addressed This Visit      Other   Cough    Symptoms and exam are consistent with bronchitis with concern for possible pneumonia. Obtain x-ray. In office injection of Toradol provided for general body aches per patient request. Start  azithromycin. Start promethazine DM as needed for cough and sleep. Start albuterol as needed for wheezing and shortness of breath. Continue over-the-counter medications as needed for symptom relief and supportive care. Follow-up if symptoms worsen or do not improve.      Relevant Medications   azithromycin (ZITHROMAX) 250 MG tablet   promethazine-dextromethorphan (PROMETHAZINE-DM) 6.25-15 MG/5ML syrup   albuterol (PROVENTIL HFA;VENTOLIN HFA) 108 (90 Base) MCG/ACT inhaler   traMADol (ULTRAM) 50 MG tablet   ketorolac (TORADOL) injection 60 mg (Completed)   Other Relevant Orders   DG Chest 2 View       I am having Catherine Baker start on azithromycin, promethazine-dextromethorphan, and albuterol. I am also having her maintain her omega-3 acid ethyl esters, b complex vitamins, telmisartan-hydrochlorothiazide, methocarbamol, zolpidem, and traMADol. We administered ketorolac.   Meds ordered this encounter  Medications  . azithromycin (ZITHROMAX) 250 MG tablet    Sig: Take 2 tablets by mouth for 1 day and then 1 tablet daily for 4 days.    Dispense:  6 tablet    Refill:  0    Order Specific Question:   Supervising Provider    Answer:   Hillard DankerRAWFORD, ELIZABETH A [[id number]]  . promethazine-dextromethorphan (PROMETHAZINE-DM) 6.25-15 MG/5ML syrup    Sig: Take 5 mLs by mouth 4 (four) times daily as needed.    Dispense:  118 mL    Refill:  0    Order Specific Question:   Supervising Provider    Answer:   Hillard DankerRAWFORD, ELIZABETH A [[id number]]  . DISCONTD: traMADol (ULTRAM) 50 MG tablet    Sig: Take 1 tablet (50 mg total) by mouth every 8 (eight) hours as needed.    Dispense:  30 tablet    Refill:  0    Order Specific Question:   Supervising Provider    Answer:   Hillard DankerRAWFORD, ELIZABETH A [[id number]]  . albuterol (PROVENTIL HFA;VENTOLIN HFA) 108 (90 Base) MCG/ACT inhaler    Sig: Inhale 2 puffs into the lungs every 6 (six) hours as needed for wheezing or shortness of breath.    Dispense:  1 Inhaler    Refill:  0     Order Specific Question:   Supervising Provider    Answer:   Hillard DankerRAWFORD, ELIZABETH A [[id number]]  . traMADol (ULTRAM) 50 MG tablet    Sig: Take 1 tablet (50 mg total) by mouth every 8 (eight) hours as needed.    Dispense:  30 tablet    Refill:  0    Order Specific Question:   Supervising Provider    Answer:   Hillard DankerRAWFORD, ELIZABETH A [[id number]]  . ketorolac (TORADOL) injection 60 mg     Follow-up: Return if symptoms worsen or fail to improve.  Jeanine Luzalone, Gregory, FNP

## 2016-07-10 NOTE — Patient Instructions (Signed)
Thank you for choosing [facility].  SUMMARY AND INSTRUCTIONS:  Medication:  Start the azithromycin.   Start promethazine-DM as needed for cough and sleep.   Tramadol as needed for pain.   Your prescription(s) have been submitted to your pharmacy or been printed and provided for you. Please take as directed and contact our office if you believe you are having problem(s) with the medication(s) or have any questions.  Follow up:  If your symptoms worsen or fail to improve, please contact our office for further instruction, or in case of emergency go directly to the emergency room at the closest medical facility.   General Recommendations:    Please drink plenty of fluids.  Get plenty of rest   Sleep in humidified air  Use saline nasal sprays  Netti pot   OTC Medications:  Decongestants - helps relieve congestion   Flonase (generic fluticasone) or Nasacort (generic triamcinolone) - please make sure to use the "cross-over" technique at a 45 degree angle towards the opposite eye as opposed to straight up the nasal passageway.   Sudafed (generic pseudoephedrine - Note this is the one that is available behind the pharmacy counter); Products with phenylephrine (-PE) may also be used but is often not as effective as pseudoephedrine.   If you have HIGH BLOOD PRESSURE - Coricidin HBP; AVOID any product that is -D as this contains pseudoephedrine which may increase your blood pressure.  Afrin (oxymetazoline) every 6-8 hours for up to 3 days.   Allergies - helps relieve runny nose, itchy eyes and sneezing   Claritin (generic loratidine), Allegra (fexofenidine), or Zyrtec (generic cyrterizine) for runny nose. These medications should not cause drowsiness.  Note - Benadryl (generic diphenhydramine) may be used however may cause drowsiness  Cough -   Delsym or Robitussin (generic dextromethorphan)  Expectorants - helps loosen mucus to ease removal   Mucinex (generic  guaifenesin) as directed on the package.  Headaches / General Aches   Tylenol (generic acetaminophen) - DO NOT EXCEED 3 grams (3,000 mg) in a 24 hour time period  Advil/Motrin (generic ibuprofen)   Sore Throat -   Salt water gargle   Chloraseptic (generic benzocaine) spray or lozenges / Sucrets (generic dyclonine)

## 2016-07-11 ENCOUNTER — Encounter: Payer: Self-pay | Admitting: Family

## 2016-09-12 ENCOUNTER — Encounter (INDEPENDENT_AMBULATORY_CARE_PROVIDER_SITE_OTHER): Payer: Self-pay | Admitting: Orthopedic Surgery

## 2016-09-12 ENCOUNTER — Ambulatory Visit (INDEPENDENT_AMBULATORY_CARE_PROVIDER_SITE_OTHER): Payer: Self-pay

## 2016-09-12 ENCOUNTER — Ambulatory Visit (INDEPENDENT_AMBULATORY_CARE_PROVIDER_SITE_OTHER): Payer: BLUE CROSS/BLUE SHIELD | Admitting: Orthopedic Surgery

## 2016-09-12 VITALS — BP 133/72 | HR 64 | Resp 16 | Ht 67.0 in | Wt 239.0 lb

## 2016-09-12 DIAGNOSIS — M5432 Sciatica, left side: Secondary | ICD-10-CM | POA: Diagnosis not present

## 2016-09-12 DIAGNOSIS — M25552 Pain in left hip: Secondary | ICD-10-CM

## 2016-09-12 DIAGNOSIS — M79605 Pain in left leg: Secondary | ICD-10-CM

## 2016-09-12 DIAGNOSIS — M5442 Lumbago with sciatica, left side: Secondary | ICD-10-CM | POA: Diagnosis not present

## 2016-09-12 MED ORDER — METHYLPREDNISOLONE 4 MG PO TBPK: ORAL_TABLET | ORAL | 0 refills | Status: DC

## 2016-09-12 MED ORDER — OXYCODONE-ACETAMINOPHEN 10-325 MG PO TABS: 0.5000 | ORAL_TABLET | ORAL | 0 refills | Status: DC | PRN

## 2016-09-12 MED ORDER — ONDANSETRON 4 MG PO TBDP: 4.0000 mg | ORAL_TABLET | Freq: Three times a day (TID) | ORAL | 0 refills | Status: AC | PRN

## 2016-09-12 MED ORDER — ONDANSETRON 4 MG PO TBDP: 4.0000 mg | ORAL_TABLET | Freq: Three times a day (TID) | ORAL | 0 refills | Status: DC | PRN

## 2016-09-12 NOTE — Progress Notes (Signed)
Office Visit Note   Patient: Catherine Baker           Date of Birth: Nov 13, 1950           MRN: [id number] Visit Date: 09/12/2016              Requested by: Veryl Speak, FNP [address] [place], [place] [address] PCP: Jeanine Luz, FNP   Assessment & Plan: Visit Diagnoses:  1. Acute left-sided low back pain with left-sided sciatica   2. Pain in left hip   3. Pain in left leg   4. 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 #2: Prescriptions for Percocet, Zofran, Medrol Dosepak were given. Even though her chart states that she is allergic to oxycodone she states that she tolerates it and uses Zofran to help with her nausea. He did not want to try hydrocodone because it gave her more reactivity. #3: Given the fact instructions about cauda equina and if these symptoms occur she is to directly to the emergency room and an MRI will be indicated. #4: Follow back up in 1 week or earlier if necessary.  Follow-Up Instructions: Return in about 1 week (around 09/19/2016).   Orders:  Orders Placed This Encounter  Procedures  . XR HIP UNILAT W OR W/O PELVIS 2-3 VIEWS LEFT  . XR Lumbar Spine 2-3 Views   Meds ordered this encounter  Medications  . methylPREDNISolone (MEDROL DOSEPAK) 4 MG TBPK tablet    Sig: MEDROL DOSEPACK    Dispense:  21 tablet    Refill:  0    Order Specific Question:   Supervising Provider    Answer:   Valeria Batman [[id number]]  . oxyCODONE-acetaminophen (PERCOCET) 10-325 MG tablet    Sig: Take 0.5-1 tablets by mouth every 4 (four) hours as needed for pain.    Dispense:  30 tablet    Refill:  0    Order Specific Question:   Supervising Provider    Answer:   Valeria Batman [[id number]]  . DISCONTD: ondansetron (ZOFRAN-ODT) 4 MG disintegrating tablet    Sig: Take 1 tablet (4 mg total) by mouth every 8 (eight) hours as needed for nausea or vomiting.    Dispense:  20 tablet    Refill:  0    Order  Specific Question:   Supervising Provider    Answer:   Valeria Batman [[id number]]  . ondansetron (ZOFRAN-ODT) 4 MG disintegrating tablet    Sig: Take 1 tablet (4 mg total) by mouth every 8 (eight) hours as needed for nausea or vomiting.    Dispense:  20 tablet    Refill:  0    Order Specific Question:   Supervising Provider    Answer:   Valeria Batman [[id number]]      Procedures: No procedures performed   Clinical Data: No additional findings.   Subjective: Chief Complaint  Patient presents with  . Right Knee - Pain  . Left Knee - Pain  . Lower Back - Pain  . Left Hip - Pain    Quin is a very pleasant female who is seen today for low back pain with radiation into the left hip and into the foot. 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 She unfortunately has started having this severe pain which she states is a radicular  type symptoms with some numbness into her foot. Pain and numbness actually are into the great toe area. 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 She is gotten to the point where she is having difficulty doing her job at [employer] and [employer]. She comes in today in extreme pain and discomfort. She denies any bowel or bladder incontinence.       Review of Systems  Cardiovascular:       Hypertension  All other systems reviewed and are negative.    Objective: Vital Signs: BP 133/72 (BP Location: Left Arm, Patient Position: Sitting, Cuff Size: Normal)   Pulse 64   Resp 16   Ht 5\' 7"  (1.702 m)   Wt 239 lb (108.4 kg)   BMI 37.43 kg/m   Physical Exam  Constitutional: She is oriented to person, place, and time. She appears well-developed and well-nourished.  HENT:  Head: Normocephalic and atraumatic.  Eyes: EOM are normal. Pupils are equal, round, and reactive to light.  Pulmonary/Chest:  Effort normal.  Neurological: She is alert and oriented to person, place, and time.  Skin: Skin is warm and dry.  Psychiatric: She has a normal mood and affect. Her behavior is normal. Judgment and thought content normal.    Back Exam   Muscle Strength  Right Quadriceps:  4/5  Left Quadriceps:  4/5  Right Hamstrings:  4/5  Left Hamstrings:  4/5   Reflexes  Patellar: 0/4 Achilles: 0/4  Other  Gait: antalgic   Comments:  She is limited with her range of motion of the lumbosacral spine. She does have some pain with range of motion of the left hip. Little bit tenderness over the trochanteric region. She seems to be a little bit weaker in her resistance and plantar flexion. Positive straight leg raising more on the left than on the right. Slight decreased sensation on the left in comparison to the right.      Specialty Comments:  No specialty comments available.  Imaging: Xr Hip Unilat W Or W/o Pelvis 2-3 Views Left  Result Date: 09/12/2016 Pelvis x-rays reveals maintenance of joint space of both the femoral acetabular joints. There is some spurring off of the left ASIS area. Also on the left trochanteric region at the greater trochanter base is some spurs. SI joints are narrowed. Calcification noted in the right hemi pelvic area as well as over the intertrochanteric Region consistent with a calcific tendinitis.  Xr Lumbar Spine 2-3 Views  Result Date: 09/12/2016 Two-view lumbar spine reveals on the AP joint space narrowing with a mild degenerative scoliosis. There is some spurring at the L3-4-4 5 and S1 more on the right than on the left. Calcifications noted in the right ilium. This is in the soft tissue posteriorly. Lateral reveals straightening of the lordotic curve. Disc space narrowing diffusely in the lumbar spine most narrowing at L5 S1. Diffuse anterior spurs on the lateral diffusely L1-S1. Appears to be some narrowing of the foramen at the L5-S1    PMFS History: Patient  Active Problem List   Diagnosis Date Noted  . Cough 07/10/2016  . Muscle spasm of back 03/28/2016  . Abnormal urine odor 04/25/2015  . Essential hypertension 10/22/2014  . Hypothyroidism 10/22/2014  . Insomnia 10/22/2014  . Type 2 diabetes mellitus (HCC) 10/22/2014   Past Medical History:  Diagnosis Date  . Diabetes mellitus without complication (  HCC)   . Hypertension     Family History  Problem Relation Age of Onset  . Hypertension Mother   . Heart disease Mother   . Hyperlipidemia Mother   . Hypertension Father   . Heart disease Father   . Hyperlipidemia Father   . Breast cancer Maternal Grandmother     Past Surgical History:  Procedure Laterality Date  . ABDOMINAL HYSTERECTOMY    . TUBAL LIGATION     Social History   Occupational History  . Nurse    Social History Main Topics  . Smoking status: Former Smoker    Packs/day: 2.00    Years: 11.00    Types: Cigarettes    Quit date: 971990  . Smokeless tobacco: Never Used  . Alcohol use Yes     Comment: occasional  . Drug use: No  . Sexual activity: Not on file

## 2016-09-18 ENCOUNTER — Encounter (INDEPENDENT_AMBULATORY_CARE_PROVIDER_SITE_OTHER): Payer: Self-pay | Admitting: Orthopaedic Surgery

## 2016-09-18 ENCOUNTER — Ambulatory Visit (INDEPENDENT_AMBULATORY_CARE_PROVIDER_SITE_OTHER): Payer: BLUE CROSS/BLUE SHIELD | Admitting: Orthopaedic Surgery

## 2016-09-18 VITALS — BP 126/84 | HR 65 | Resp 14 | Ht 67.0 in | Wt 239.0 lb

## 2016-09-18 DIAGNOSIS — M545 Low back pain, unspecified: Secondary | ICD-10-CM

## 2016-09-18 NOTE — Patient Instructions (Signed)
Back Exercises Introduction If you have pain in your back, do these exercises 2-3 times each day or as told by your doctor. When the pain goes away, do the exercises once each day, but repeat the steps more times for each exercise (do more repetitions). If you do not have pain in your back, do these exercises once each day or as told by your doctor. Exercises Single Knee to Chest  Do these steps 3-5 times in a row for each leg: 1. Lie on your back on a firm bed or the floor with your legs stretched out. 2. Bring one knee to your chest. 3. Hold your knee to your chest by grabbing your knee or thigh. 4. Pull on your knee until you feel a gentle stretch in your lower back. 5. Keep doing the stretch for 10-30 seconds. 6. Slowly let go of your leg and straighten it. Pelvic Tilt  Do these steps 5-10 times in a row: 1. Lie on your back on a firm bed or the floor with your legs stretched out. 2. Bend your knees so they point up to the ceiling. Your feet should be flat on the floor. 3. Tighten your lower belly (abdomen) muscles to press your lower back against the floor. This will make your tailbone point up to the ceiling instead of pointing down to your feet or the floor. 4. Stay in this position for 5-10 seconds while you gently tighten your muscles and breathe evenly. Cat-Cow  Do these steps until your lower back bends more easily: 1. Get on your hands and knees on a firm surface. Keep your hands under your shoulders, and keep your knees under your hips. You may put padding under your knees. 2. Let your head hang down, and make your tailbone point down to the floor so your lower back is round like the back of a cat. 3. Stay in this position for 5 seconds. 4. Slowly lift your head and make your tailbone point up to the ceiling so your back hangs low (sags) like the back of a cow. 5. Stay in this position for 5 seconds. Press-Ups  Do these steps 5-10 times in a row: 1. Lie on your belly  (face-down) on the floor. 2. Place your hands near your head, about shoulder-width apart. 3. While you keep your back relaxed and keep your hips on the floor, slowly straighten your arms to raise the top half of your body and lift your shoulders. Do not use your back muscles. To make yourself more comfortable, you may change where you place your hands. 4. Stay in this position for 5 seconds. 5. Slowly return to lying flat on the floor. Bridges  Do these steps 10 times in a row: 1. Lie on your back on a firm surface. 2. Bend your knees so they point up to the ceiling. Your feet should be flat on the floor. 3. Tighten your butt muscles and lift your butt off of the floor until your waist is almost as high as your knees. If you do not feel the muscles working in your butt and the back of your thighs, slide your feet 1-2 inches farther away from your butt. 4. Stay in this position for 3-5 seconds. 5. Slowly lower your butt to the floor, and let your butt muscles relax. If this exercise is too easy, try doing it with your arms crossed over your chest. Belly Crunches  Do these steps 5-10 times in a row: 1. Lie   on your back on a firm bed or the floor with your legs stretched out. 2. Bend your knees so they point up to the ceiling. Your feet should be flat on the floor. 3. Cross your arms over your chest. 4. Tip your chin a little bit toward your chest but do not bend your neck. 5. Tighten your belly muscles and slowly raise your chest just enough to lift your shoulder blades a tiny bit off of the floor. 6. Slowly lower your chest and your head to the floor. Back Lifts  Do these steps 5-10 times in a row: 1. Lie on your belly (face-down) with your arms at your sides, and rest your forehead on the floor. 2. Tighten the muscles in your legs and your butt. 3. Slowly lift your chest off of the floor while you keep your hips on the floor. Keep the back of your head in line with the curve in your back.  Look at the floor while you do this. 4. Stay in this position for 3-5 seconds. 5. Slowly lower your chest and your face to the floor. Contact a doctor if:  Your back pain gets a lot worse when you do an exercise.  Your back pain does not lessen 2 hours after you exercise. If you have any of these problems, stop doing the exercises. Do not do them again unless your doctor says it is okay. Get help right away if:  You have sudden, very bad back pain. If this happens, stop doing the exercises. Do not do them again unless your doctor says it is okay. This information is not intended to replace advice given to you by your health care provider. Make sure you discuss any questions you have with your health care provider. Document Released: 09/01/2010 Document Revised: 01/05/2016 Document Reviewed: 09/23/2014  2017 Elsevier  

## 2016-09-18 NOTE — Progress Notes (Signed)
Office Visit Note   Patient: Catherine Baker           Date of Birth: [birth year]           MRN: [id number] Visit Date: 09/18/2016              Requested by: Veryl Speak, FNP [address] [place], [place] [address] PCP: Jeanine Luz, FNP   Assessment & Plan: Visit Diagnoses: Low back pain with x-rays consistent with degenerative arthrosis. Significant improvement since last week on medication regimen  Plan: Ibuprofen 800 mg up to 3 times a day as needed, Robaxin as needed, work note to return on March 3, follow-up as needed. Instructions on back exercises and how to download stretches.  Follow-Up Instructions: No Follow-up on file.   Orders:  No orders of the defined types were placed in this encounter.  No orders of the defined types were placed in this encounter.     Procedures: No procedures performed   Clinical Data: No additional findings.   Subjective: No chief complaint on file.   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 She unfortunately has started having this severe pain which she states is a radicular type symptoms.. She denies any history of injury or trauma. She denies any bowel or bladder incontinence.  She finished the Medrol dose pack today and has had relief with no numbness in her foot or toe.   Mrs. Ingber relates that she is significantly better today than she was last week. Prednisone seems to have made a big difference. She denies any numbness or tingling to either lower extremity. Her pain is predominantly in the lower part of her back with some referred pain to the lateral aspect of her left hip.  Review of Systems   Objective: Vital Signs: There were no vitals taken for this visit.  Physical Exam  Ortho Exam straight leg raise is minimally positive on the left low back pain at just over 90. It was negative on the right.  Neurovascular exam appears to be intact. Painless range of motion of both hips and both knees. No lower extremity swelling.  Specialty Comments:  No specialty comments available.  Imaging: No results found.   PMFS History: Patient Active Problem List   Diagnosis Date Noted  . Cough 07/10/2016  . Muscle spasm of back 03/28/2016  . Abnormal urine odor 04/25/2015  . Essential hypertension 10/22/2014  . Hypothyroidism 10/22/2014  . Insomnia 10/22/2014  . Type 2 diabetes mellitus (HCC) 10/22/2014   Past Medical History:  Diagnosis Date  . Diabetes mellitus without complication (HCC)   . Hypertension     Family History  Problem Relation Age of Onset  . Hypertension Mother   . Heart disease Mother   . Hyperlipidemia Mother   . Hypertension Father   . Heart disease Father   . Hyperlipidemia Father   . Breast cancer Maternal Grandmother     Past Surgical History:  Procedure Laterality Date  . ABDOMINAL HYSTERECTOMY    . TUBAL LIGATION     Social History   Occupational History  . Nurse    Social History Main Topics  . Smoking status: Former Smoker    Packs/day: 2.00    Years: 11.00    Types: Cigarettes    Quit date: 8  . Smokeless tobacco: Never  Used  . Alcohol use Yes     Comment: occasional  . Drug use: No  . Sexual activity: Not on file

## 2016-10-05 ENCOUNTER — Telehealth: Payer: Self-pay | Admitting: Emergency Medicine

## 2016-10-05 DIAGNOSIS — I10 Essential (primary) hypertension: Secondary | ICD-10-CM

## 2016-10-05 NOTE — Telephone Encounter (Signed)
Pt needs a  90 day refill on her zolpidem (AMBIEN CR) 12.5 MG CR tablet and telmisartan-hydrochlorothiazide (MICARDIS HCT) 80-25 MG tablet. Please advise thanks.

## 2016-10-06 ENCOUNTER — Other Ambulatory Visit: Payer: Self-pay | Admitting: Family

## 2016-10-06 DIAGNOSIS — F5101 Primary insomnia: Secondary | ICD-10-CM

## 2016-10-08 MED ORDER — TELMISARTAN-HCTZ 80-25 MG PO TABS
1.0000 | ORAL_TABLET | Freq: Every day | ORAL | 1 refills | Status: DC
Start: 2016-10-08 — End: 2017-04-22

## 2016-10-08 NOTE — Telephone Encounter (Signed)
Last refill was 06/28/16

## 2016-10-08 NOTE — Telephone Encounter (Signed)
Please advise on [place]

## 2016-10-08 NOTE — Telephone Encounter (Signed)
Medications have been refilled ?

## 2016-10-08 NOTE — Telephone Encounter (Signed)
Faxed

## 2016-10-31 ENCOUNTER — Telehealth (INDEPENDENT_AMBULATORY_CARE_PROVIDER_SITE_OTHER): Payer: Self-pay | Admitting: Orthopaedic Surgery

## 2016-10-31 NOTE — Telephone Encounter (Signed)
[phone]

## 2016-12-16 ENCOUNTER — Other Ambulatory Visit: Payer: Self-pay | Admitting: Family

## 2016-12-16 DIAGNOSIS — F5101 Primary insomnia: Secondary | ICD-10-CM

## 2016-12-17 NOTE — Telephone Encounter (Signed)
Faxed

## 2016-12-17 NOTE — Telephone Encounter (Signed)
Last refill of ambien was 11/18/16 per [facility] DB

## 2017-02-11 ENCOUNTER — Telehealth (INDEPENDENT_AMBULATORY_CARE_PROVIDER_SITE_OTHER): Payer: Self-pay | Admitting: Orthopaedic Surgery

## 2017-02-11 NOTE — Telephone Encounter (Signed)
Patient having a lot of increased pain with back, and hips since Thursday. Wanted to know if there is anything she can do until she can get in with Dr. for possible injection. Doctors schedule is full for Tuesday. Please advise.

## 2017-02-12 NOTE — Telephone Encounter (Signed)
Previously had ibuprofen 800 mg po tid and robaxin-try that

## 2017-02-12 NOTE — Telephone Encounter (Signed)
Please advise 

## 2017-02-15 ENCOUNTER — Telehealth (INDEPENDENT_AMBULATORY_CARE_PROVIDER_SITE_OTHER): Payer: Self-pay | Admitting: Orthopaedic Surgery

## 2017-02-15 NOTE — Telephone Encounter (Signed)
PW wants to see her.

## 2017-02-15 NOTE — Telephone Encounter (Signed)
[facility] is going to [facility] due to increased pain. Per patient back is hurting ,knee feels like it is going to fall off. Patient scheduled an appt for Tuesday with Dr. Cleophas DunkerWhitfield. Patient states Robaxin makes her nauseous, and dizzy. Has tried Tramadol, Advil, and a couple of other meds with no relief. If there is anything Dr. Nolon Stallsecommends until she is seen, please call patient.

## 2017-02-19 ENCOUNTER — Ambulatory Visit (INDEPENDENT_AMBULATORY_CARE_PROVIDER_SITE_OTHER): Payer: PRIVATE HEALTH INSURANCE | Admitting: Orthopaedic Surgery

## 2017-02-19 ENCOUNTER — Encounter (INDEPENDENT_AMBULATORY_CARE_PROVIDER_SITE_OTHER): Payer: Self-pay | Admitting: Orthopaedic Surgery

## 2017-02-19 ENCOUNTER — Ambulatory Visit (INDEPENDENT_AMBULATORY_CARE_PROVIDER_SITE_OTHER): Payer: PRIVATE HEALTH INSURANCE

## 2017-02-19 VITALS — BP 119/69 | HR 74 | Resp 14 | Ht 67.0 in | Wt 250.0 lb

## 2017-02-19 DIAGNOSIS — M5441 Lumbago with sciatica, right side: Secondary | ICD-10-CM

## 2017-02-19 DIAGNOSIS — M1711 Unilateral primary osteoarthritis, right knee: Secondary | ICD-10-CM | POA: Diagnosis not present

## 2017-02-19 DIAGNOSIS — M5442 Lumbago with sciatica, left side: Secondary | ICD-10-CM | POA: Diagnosis not present

## 2017-02-19 DIAGNOSIS — M25561 Pain in right knee: Secondary | ICD-10-CM | POA: Diagnosis not present

## 2017-02-19 DIAGNOSIS — G8929 Other chronic pain: Secondary | ICD-10-CM

## 2017-02-19 MED ORDER — BUPIVACAINE HCL 0.5 % IJ SOLN
3.0000 mL | INTRAMUSCULAR | Status: AC | PRN
  Administered 2017-02-19: 3 mL via INTRA_ARTICULAR

## 2017-02-19 MED ORDER — LIDOCAINE HCL 2 % IJ SOLN
3.0000 mL | INTRAMUSCULAR | Status: AC | PRN
  Administered 2017-02-19: 3 mL

## 2017-02-19 MED ORDER — METHYLPREDNISOLONE ACETATE 40 MG/ML IJ SUSP
80.0000 mg | INTRAMUSCULAR | Status: AC | PRN
  Administered 2017-02-19: 80 mg

## 2017-02-19 NOTE — Progress Notes (Signed)
Office Visit Note   Patient: Catherine Baker           Date of Birth: March 22, 1951           MRN: [id number] Visit Date: 02/19/2017              Requested by: Veryl Speak, FNP [address] [place], [place] [address] PCP: Veryl Speak, FNP   Assessment & Plan: Visit Diagnoses:  1. Chronic bilateral low back pain with bilateral sciatica   2. Chronic pain of right knee   3. Unilateral primary osteoarthritis, right knee     Plan:  #1: We are going to schedule her for an MRI scan of the lumbar spine. #2:. Corticosteroid injection was given to the knee without difficulty with good results. #3: Once her MRI scan has been scheduled she will give the office a call and make an appointment to see in 2-3 days after the scan to review the results.  Follow-Up Instructions: Return for review of mri.   Orders:  Orders Placed This Encounter  Procedures  . Large Joint Injection/Arthrocentesis  . XR Knee Complete 4 Views Right  . MR Lumbar Spine w/o contrast   No orders of the defined types were placed in this encounter.     Procedures: Large Joint Inj Date/Time: 02/19/2017 2:45 PM Performed by: Jacqualine Code D Authorized by: Jacqualine Code D   Consent Given by:  Patient Timeout: prior to procedure the correct patient, procedure, and site was verified   Indications:  Pain and joint swelling Location:  Knee Site:  R knee Prep: patient was prepped and draped in usual sterile fashion   Needle Size:  25 G Needle Length:  1.5 inches Approach:  Anteromedial Ultrasound Guidance: No   Fluoroscopic Guidance: No   Arthrogram: No   Medications:  80 mg methylPREDNISolone acetate 40 MG/ML; 3 mL bupivacaine 0.5 %; 3 mL lidocaine 2 % Aspiration Attempted: No   Patient tolerance:  Patient tolerated the procedure well with no immediate complications     Clinical Data: No additional findings.    Subjective: Chief Complaint  Patient presents with  . Lower Back - Pain,  Weakness    Pt was seen 64 months ago for low back pain.  . Left Hip - Pain    Ms. Torry is a 66 y o that presents with lower back pain that radiates into the pelvis and into the R right knee x 2 weeks. She relates groin pain, possible sciatica  . Right Hip - Pain    Leaira is a very pleasant female who is seen today for low back pain with radiation into the left hip and into the foot as well as into the right buttock down to her knee.. 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 She unfortunately had started having this severe pain which she states is a radicular type symptoms with some numbness into her foot. Pain and numbness was actually  into the great toe area at that time.. She denies any history of injury or trauma but again her new working addition required her to be on her feet and dispensing medications on a memory unit.   We have seen her previously. She comes in today though again with low back pain with radiation into the buttock on both sides as well as along the lateral and anterior thigh into the anterior portion of her  foot more on the lateral aspect. She complains of numbness in her foot also again this is along the mid and lateral portion of the foot. Her symptoms are worsening. She denies any bowel or bladder incontinence.  She is also complaining of pain in her right knee. He also apparently has been clicking on her. Pain is diffuse but more on the in the joint line area. Denies any recent history of injury or trauma.    Review of Systems  Cardiovascular:       Hypertension  All other systems reviewed and are negative.    Objective: Vital Signs: BP 119/69   Pulse 74   Resp 14   Ht 5\' 7"  (1.702 m)   Wt 250 lb (113.4 kg)   BMI 39.16 kg/m   Physical Exam  Constitutional: She is oriented to person, place, and time. She appears well-developed and well-nourished.  HENT:  Head: Normocephalic and atraumatic.  Eyes:  EOM are normal. Pupils are equal, round, and reactive to light.  Pulmonary/Chest: Effort normal.  Neurological: She is alert and oriented to person, place, and time.  Skin: Skin is warm and dry.  Psychiatric: She has a normal mood and affect. Her behavior is normal. Judgment and thought content normal.    Back Exam   Muscle Strength  Right Quadriceps:  4/5  Left Quadriceps:  4/5  Right Hamstrings:  4/5  Left Hamstrings:  4/5   Reflexes  Patellar: 0/4 Achilles: 0/4  Other  Gait: antalgic   Comments:  She is limited with her range of motion of the lumbosacral spine. She does have some pain with range of motion of the left hip. Little bit tenderness over the trochanteric region. She seems to be a little bit weaker in her resistance and plantar flexion. Positive straight leg raising more on the left than on the right. Slight decreased sensation on the left in comparison to the right.    Right knee reveals trace effusion. She has tenderness to palpation along the medial joint line more than laterally. Marked patellofemoral crepitance is also noted. Ligamentous is stable.  Specialty Comments:  No specialty comments available.  Imaging: Xr Knee Complete 4 Views Right  Result Date: 02/19/2017 4 view x-ray of the right knee reveals significant medial compartment OA with joint space narrowing and periarticular spurring. She also laterally does have some spurring on the distal lateral femoral condyle. March sclerosing medial tibial plateau. Patellofemoral joint does show significant periarticular spurring also.    PMFS History: Patient Active Problem List   Diagnosis Date Noted  . Cough 07/10/2016  . Muscle spasm of back 03/28/2016  . Abnormal urine odor 04/25/2015  . Essential hypertension 10/22/2014  . Hypothyroidism 10/22/2014  . Insomnia 10/22/2014  . Type 2 diabetes mellitus (HCC) 10/22/2014   Past Medical History:  Diagnosis Date  . Diabetes mellitus without complication  (HCC)   . Hypertension     Family History  Problem Relation Age of Onset  . Hypertension Mother   . Heart disease Mother   . Hyperlipidemia Mother   . Hypertension Father   . Heart disease Father   . Hyperlipidemia Father   . Breast cancer Maternal Grandmother     Past Surgical History:  Procedure Laterality Date  . ABDOMINAL HYSTERECTOMY    . TUBAL LIGATION     Social History   Occupational History  . Nurse    Social History Main Topics  . Smoking status: Former Smoker    Packs/day: 2.00  Years: 11.00    Types: Cigarettes    Quit date: 451990  . Smokeless tobacco: Never Used  . Alcohol use Yes     Comment: occasional  . Drug use: No  . Sexual activity: Not on file

## 2017-03-12 ENCOUNTER — Other Ambulatory Visit: Payer: Self-pay | Admitting: Family

## 2017-03-13 ENCOUNTER — Telehealth (INDEPENDENT_AMBULATORY_CARE_PROVIDER_SITE_OTHER): Payer: Self-pay | Admitting: Orthopaedic Surgery

## 2017-03-13 ENCOUNTER — Other Ambulatory Visit (INDEPENDENT_AMBULATORY_CARE_PROVIDER_SITE_OTHER): Payer: Self-pay

## 2017-03-13 ENCOUNTER — Other Ambulatory Visit (INDEPENDENT_AMBULATORY_CARE_PROVIDER_SITE_OTHER): Payer: Self-pay | Admitting: Orthopedic Surgery

## 2017-03-13 MED ORDER — DIAZEPAM 5 MG PO TABS: 5.0000 mg | ORAL_TABLET | Freq: Once | ORAL | 0 refills | Status: DC | PRN

## 2017-03-13 NOTE — Telephone Encounter (Signed)
OK thank you 

## 2017-03-13 NOTE — Telephone Encounter (Signed)
Please print.  Thank you.

## 2017-03-13 NOTE — Telephone Encounter (Signed)
Can this be rescheduled? I see the note about claustrophobic

## 2017-03-13 NOTE — Telephone Encounter (Signed)
Pt can call to reschedule for an open scanner

## 2017-03-13 NOTE — Telephone Encounter (Signed)
Patient calling because MRI scheduled for 8/3 is a closed MRI. Patient is Clausterphobic. Please call to advise.

## 2017-03-13 NOTE — Telephone Encounter (Signed)
I called patient and she will call [facility] to reschedule. Would you please print for patient pick up for Valium 10mg  since I cannot e-scribe per PW. 1 dose, 0 refills prior to OPEN MRI Thank you

## 2017-03-14 ENCOUNTER — Other Ambulatory Visit: Payer: Self-pay | Admitting: Family

## 2017-03-14 DIAGNOSIS — F5101 Primary insomnia: Secondary | ICD-10-CM

## 2017-03-14 NOTE — Telephone Encounter (Signed)
Faxed

## 2017-03-14 NOTE — Telephone Encounter (Signed)
Last refill was 02/05/17 per [facility] 

## 2017-03-15 ENCOUNTER — Other Ambulatory Visit: Payer: BLUE CROSS/BLUE SHIELD

## 2017-04-01 ENCOUNTER — Ambulatory Visit
Admission: RE | Admit: 2017-04-01 | Discharge: 2017-04-01 | Disposition: A | Discharge status: Home / Self Care | Payer: BLUE CROSS/BLUE SHIELD | Source: Ambulatory Visit | Attending: Orthopedic Surgery | Admitting: Orthopedic Surgery

## 2017-04-01 DIAGNOSIS — M5441 Lumbago with sciatica, right side: Principal | ICD-10-CM

## 2017-04-01 DIAGNOSIS — G8929 Other chronic pain: Secondary | ICD-10-CM

## 2017-04-01 DIAGNOSIS — M5442 Lumbago with sciatica, left side: Principal | ICD-10-CM

## 2017-04-02 ENCOUNTER — Telehealth (INDEPENDENT_AMBULATORY_CARE_PROVIDER_SITE_OTHER): Payer: Self-pay | Admitting: Orthopaedic Surgery

## 2017-04-02 NOTE — Telephone Encounter (Addendum)
Patient went for  MRI yesterday, but was unable to do it. Please call to advise. Question Rx for meds to help her get thru it.  Patient also needs refill on Robaxin. Patient uses [facility].

## 2017-04-03 NOTE — Telephone Encounter (Signed)
What to do??

## 2017-04-04 ENCOUNTER — Other Ambulatory Visit: Payer: Self-pay | Admitting: Family

## 2017-04-04 NOTE — Telephone Encounter (Signed)
Valium 5mg  tab #2   1-2 po 1 hour prior to MRI

## 2017-04-05 ENCOUNTER — Other Ambulatory Visit (INDEPENDENT_AMBULATORY_CARE_PROVIDER_SITE_OTHER): Payer: Self-pay

## 2017-04-05 MED ORDER — DIAZEPAM 5 MG PO TABS: ORAL_TABLET | ORAL | 0 refills | Status: DC

## 2017-04-05 NOTE — Telephone Encounter (Signed)
Faxed RX to PHARMACY

## 2017-04-22 ENCOUNTER — Encounter: Payer: Self-pay | Admitting: Nurse Practitioner

## 2017-04-22 ENCOUNTER — Ambulatory Visit (INDEPENDENT_AMBULATORY_CARE_PROVIDER_SITE_OTHER): Payer: PRIVATE HEALTH INSURANCE | Admitting: Nurse Practitioner

## 2017-04-22 VITALS — BP 170/100 | HR 66 | Temp 98.2°F | Ht 67.0 in | Wt 248.0 lb

## 2017-04-22 DIAGNOSIS — F4320 Adjustment disorder, unspecified: Secondary | ICD-10-CM

## 2017-04-22 DIAGNOSIS — I10 Essential (primary) hypertension: Secondary | ICD-10-CM | POA: Diagnosis not present

## 2017-04-22 DIAGNOSIS — F4321 Adjustment disorder with depressed mood: Secondary | ICD-10-CM

## 2017-04-22 MED ORDER — ALPRAZOLAM 0.25 MG PO TABS: 0.5000 mg | ORAL_TABLET | Freq: Three times a day (TID) | ORAL | 0 refills | Status: DC | PRN

## 2017-04-22 MED ORDER — ALPRAZOLAM 0.25 MG PO TABS: 0.2500 mg | ORAL_TABLET | Freq: Two times a day (BID) | ORAL | 0 refills | Status: DC | PRN

## 2017-04-22 MED ORDER — TELMISARTAN-HCTZ 80-25 MG PO TABS: 1.0000 | ORAL_TABLET | Freq: Every day | ORAL | 0 refills | Status: DC

## 2017-04-22 NOTE — Progress Notes (Signed)
Subjective:  Patient ID: Catherine Baker, female    DOB: January 20, 1951  Age: 66 y.o. MRN: [id number]  CC: Anxiety (get anxiety when think about her mother,hard time focusing at work. mother pass since 01/2017--miss work today) and Eye Problem (eyes pink--yesterday?)   Anxiety  Presents for initial visit. Onset was 1 to 6 months ago. The problem has been waxing and waning (since mother's death 3months ago). Symptoms include feeling of choking, hyperventilation, muscle tension, nervous/anxious behavior, palpitations and panic. Patient reports no confusion, decreased concentration, depressed mood, dizziness, dry mouth, excessive worry, impotence, insomnia, irritability, nausea, obsessions, restlessness, shortness of breath or suicidal ideas. Symptoms occur most days. The severity of symptoms is interfering with daily activities. The symptoms are aggravated by family issues. The quality of sleep is fair. Nighttime awakenings: occasional.   There are no known risk factors. Her past medical history is significant for anxiety/panic attacks. There is no history of depression or suicide attempts. Past treatments include nothing.  Eye Problem   The left eye is affected. This is a new problem. The current episode started today. The problem has been resolved. There was no injury mechanism. There is no known exposure to pink eye. She wears contacts (does not sleep with contacts). Associated symptoms include an eye discharge and eye redness. Pertinent negatives include no blurred vision, double vision, fever, foreign body sensation, itching, nausea, photophobia, recent URI or vomiting. She has tried commercial eye wash and water for the symptoms. The treatment provided significant relief.     Outpatient Medications Prior to Visit  Medication Sig Dispense Refill  . b complex vitamins tablet Take 1 tablet by mouth daily.    . methocarbamol (ROBAXIN) 500 MG tablet TAKE 1 TABLET BY MOUTH EVERY 8 HOURS AS NEEDED FOR  MUSCLE SPASM 40 tablet 0  . diazepam (VALIUM) 5 MG tablet Take 1-2 po 1 hour prior to MRI 2 tablet 0  . telmisartan-hydrochlorothiazide (MICARDIS HCT) 80-25 MG tablet Take 1 tablet by mouth daily. 90 tablet 1  . omega-3 acid ethyl esters (LOVAZA) 1 G capsule Take 1 g by mouth daily.    Marland Kitchen oxyCODONE-acetaminophen (PERCOCET) 10-325 MG tablet Take 1 tablet by mouth every 4 (four) hours as needed for pain.    Marland Kitchen albuterol (PROVENTIL HFA;VENTOLIN HFA) 108 (90 Base) MCG/ACT inhaler Inhale 2 puffs into the lungs every 6 (six) hours as needed for wheezing or shortness of breath. (Patient not taking: Reported on 04/22/2017) 1 Inhaler 0  . azithromycin (ZITHROMAX) 250 MG tablet Take 2 tablets by mouth for 1 day and then 1 tablet daily for 4 days. (Patient not taking: Reported on 09/12/2016) 6 tablet 0  . methylPREDNISolone (MEDROL DOSEPAK) 4 MG TBPK tablet MEDROL DOSEPACK (Patient not taking: Reported on 02/19/2017) 21 tablet 0  . oxyCODONE-acetaminophen (PERCOCET) 10-325 MG tablet Take 0.5-1 tablets by mouth every 4 (four) hours as needed for pain. (Patient not taking: Reported on 02/19/2017) 30 tablet 0  . promethazine-dextromethorphan (PROMETHAZINE-DM) 6.25-15 MG/5ML syrup Take 5 mLs by mouth 4 (four) times daily as needed. (Patient not taking: Reported on 02/19/2017) 118 mL 0  . traMADol (ULTRAM) 50 MG tablet Take 1 tablet (50 mg total) by mouth every 8 (eight) hours as needed. (Patient not taking: Reported on 09/12/2016) 30 tablet 0  . zolpidem (AMBIEN CR) 12.5 MG CR tablet TAKE 1 TABLET BY MOUTH AT BEDTIME AS NEEDED FOR SLEEP (Patient not taking: Reported on 04/22/2017) 30 tablet 1   No facility-administered medications prior to visit.  ROS See HPI  Objective:  BP (!) 170/100   Pulse 66   Temp 98.2 F (36.8 C)   Ht  (1.702 m)   Wt 248 lb (112.5 kg)   SpO2 99%   BMI 38.84 kg/m   BP Readings from Last 3 Encounters:  04/22/17 (!) 170/100  02/19/17 119/69  09/18/16 126/84    Wt Readings  from Last 3 Encounters:  04/22/17 248 lb (112.5 kg)  02/19/17 250 lb (113.4 kg)  09/18/16 239 lb (108.4 kg)    Physical Exam  Constitutional: She is oriented to person, place, and time.  HENT:  Mouth/Throat: No oropharyngeal exudate.  Eyes: Pupils are equal, round, and reactive to light. Conjunctivae, EOM and lids are normal. Right eye exhibits no chemosis, no discharge, no exudate and no hordeolum. Left eye exhibits no chemosis, no discharge, no exudate and no hordeolum. No scleral icterus.  Cardiovascular: Normal rate.   Pulmonary/Chest: Effort normal.  Neurological: She is alert and oriented to person, place, and time.  Skin: Skin is warm and dry.  Vitals reviewed.   Lab Results  Component Value Date   WBC 8.2 07/26/2015   HGB 14.5 07/26/2015   HCT 43.6 07/26/2015   PLT 337 07/26/2015   GLUCOSE 121 (H) 07/26/2015   ALT 21 07/26/2015   AST 18 07/26/2015   NA 138 07/26/2015   K 3.5 07/26/2015   CL 101 07/26/2015   CREATININE 0.76 07/26/2015   BUN 12 07/26/2015   CO2 31 07/26/2015   TSH 3.11 04/04/2016   HGBA1C 6.1 04/04/2016   MICROALBUR <0.7 04/04/2016    No results found.  Assessment & Plan:   Catherine Baker was seen today for anxiety and eye problem.  Diagnoses and all orders for this visit:  Grief reaction -     Discontinue: ALPRAZolam (XANAX) 0.25 MG tablet; Take 2 tablets (0.5 mg total) by mouth 3 (three) times daily as needed for anxiety. -     Discontinue: ALPRAZolam (XANAX) 0.25 MG tablet; Take 1-2 tablets (0.25-0.5 mg total) by mouth 2 (two) times daily as needed for anxiety. -     ALPRAZolam (XANAX) 0.25 MG tablet; Take 1-2 tablets (0.25-0.5 mg total) by mouth 2 (two) times daily as needed for anxiety.  Essential hypertension -     telmisartan-hydrochlorothiazide (MICARDIS HCT) 80-25 MG tablet; Take 1 tablet by mouth daily.   I have discontinued [name]'s azithromycin, promethazine-dextromethorphan, albuterol, traMADol, methylPREDNISolone, zolpidem, and  diazepam. I am also having her maintain her omega-3 acid ethyl esters, b complex vitamins, oxyCODONE-acetaminophen, methocarbamol, telmisartan-hydrochlorothiazide, and ALPRAZolam.  Meds ordered this encounter  Medications  . DISCONTD: ALPRAZolam (XANAX) 0.25 MG tablet    Sig: Take 2 tablets (0.5 mg total) by mouth 3 (three) times daily as needed for anxiety.    Dispense:  21 tablet    Refill:  0    Order Specific Question:   Supervising Provider    Answer:   Tresa Garter [[id number]]  . telmisartan-hydrochlorothiazide (MICARDIS HCT) 80-25 MG tablet    Sig: Take 1 tablet by mouth daily.    Dispense:  90 tablet    Refill:  0    Order Specific Question:   Supervising Provider    Answer:   Tresa Garter [[id number]]  . DISCONTD: ALPRAZolam (XANAX) 0.25 MG tablet    Sig: Take 1-2 tablets (0.25-0.5 mg total) by mouth 2 (two) times daily as needed for anxiety.    Dispense:  21 tablet    Refill:  0    Order Specific Question:   Supervising Provider    Answer:   Tresa GarterPLOTNIKOV, ALEKSEI V [[id number]]  . ALPRAZolam (XANAX) 0.25 MG tablet    Sig: Take 1-2 tablets (0.25-0.5 mg total) by mouth 2 (two) times daily as needed for anxiety.    Dispense:  21 tablet    Refill:  0    Order Specific Question:   Supervising Provider    Answer:   Tresa GarterPLOTNIKOV, ALEKSEI V [[id number]]    Follow-up: Return in about 4 weeks (around 05/20/2017) for HTN and Grief reaction with Tammy SoursGreg calone.  Alysia Pennaharlotte Ayodele Hartsock, NP

## 2017-04-22 NOTE — Patient Instructions (Addendum)
Call office BP stay >150/90.  Check BP once a day and record.  Advised patient about use of warm compress 2-3times a day. Discard of current contact lens. Do not wear contact lens for next 1week. Continue to clean with warm water and mild soap. Call office if no improvement in 3days.  Do not take ambien and xanax at same time or within 2hours of each medication  Adjustment Disorder, Adult Adjustment disorder is a group of symptoms that can develop after a stressful life event, such as the loss of a job or serious physical illness. The symptoms can affect how you feel, think, and act. They may interfere with your relationships. Adjustment disorder increases your risk of suicide and substance abuse. If this disorder is not managed early, it can develop into a more serious condition, such as major depressive disorder or post-traumatic stress disorder. What are the causes? This condition happens when you have trouble recovering from or coping with a stressful life event. What increases the risk? You are more likely to develop this condition if:  You have had depression or anxiety.  You are being treated for a long-term (chronic) illness.  You are being treated for an illness that cannot be cured (terminal illness).  You have a family history of mental illness.  What are the signs or symptoms? Symptoms of this condition include:  Extreme trouble doing daily tasks, such as going to work.  Sadness, depression, or crying spells.  Worrying a lot.  Loss of enjoyment.  Change in appetite or weight.  Feelings of loss or hopelessness.  Thoughts of suicide.  Anxiety, worry, or nervousness.  Trouble sleeping.  Avoiding family and friends.  Fighting or vandalism.  Complaining of feeling sick without being ill.  Feeling dazed or disconnected.  Nightmares.  Trouble sleeping.  Irritability.  Reckless driving.  Poor work International aid/development worker.  Ignoring bills.  Symptoms of this  condition start within three months of the stressful event. They do not last more than six months, unless the stressful circumstances last longer. Normal grieving after the death of a loved one is not a symptom of this condition. How is this diagnosed? To diagnose this condition, your health care provider will ask about what has happened in your life and how it has affected you. He or she may also ask about your medical history and your use of medicines, alcohol, and other substances. Your health care provider may do a physical exam and order lab tests or other studies. You may be referred to a mental health specialist. How is this treated? Treatment options for this condition include:  Counseling or talk therapy. Talk therapy is usually provided by mental health specialists.  Medicines. Certain medicines may help with depression, anxiety, and sleep.  Support groups. These offer emotional support, advice, and guidance. They are made up of people who have had similar experiences.  Observation and time. This is sometimes called "watchful waiting." In this treatment, health care providers monitor your health and behavior without other treatment. Adjustment disorder sometimes gets better on its own with time.  Follow these instructions at home:  Take over-the-counter and prescription medicines only as told by your health care provider.  Keep all follow-up visits as told by your health care provider. This is important. Contact a health care provider if:  Your symptoms do not improve in six months.  Your symptoms get worse. Get help right away if:  You have serious thoughts about hurting yourself or someone else. If you  ever feel like you may hurt yourself or others, or have thoughts about taking your own life, get help right away. You can go to your nearest emergency department or call:  Your local emergency services (911 in the [place]  A suicide crisis helpline, such as the National  Suicide Prevention Lifeline at 660-496-71761-570 839 6852. This is open 24 hours a day.  Summary  Adjustment disorder is a group of symptoms that can develop after a stressful life event, such as the loss of a job or serious physical illness. The symptoms can affect how you feel, think, and act. They may interfere with your relationships.  Symptoms of this condition start within three months of the stressful event. They do not last more than six months, unless the stressful circumstances last longer.  Treatment may include talk therapy, medicines, participation in a support group, or observation to see if symptoms improve.  Contact your health care provider if your symptoms get worse or do not improve in six months.  If you ever feel like you may hurt yourself or others, or have thoughts about taking your own life, get help right away. This information is not intended to replace advice given to you by your health care provider. Make sure you discuss any questions you have with your health care provider. Document Released: 04/03/2006 Document Revised: 09/28/2016 Document Reviewed: 09/28/2016 Elsevier Interactive Patient Education  [employer]2018 [employer]

## 2017-05-28 ENCOUNTER — Other Ambulatory Visit: Payer: Self-pay | Admitting: Family

## 2017-06-02 ENCOUNTER — Other Ambulatory Visit: Payer: Self-pay | Admitting: Family

## 2017-06-03 ENCOUNTER — Telehealth: Payer: Self-pay | Admitting: Family

## 2017-06-05 NOTE — Telephone Encounter (Signed)
Pt came by the office stating that the pharmacy has been sending over requests and she was told to contact our office. She said that these medications are her "regular" medications that she has been on for a while. She mentioned that after seeing Claris Gowerharlotte, she did not want to continue taking the medication that was prescribed and has started seeing a councilor which has helped tremendously.  She said that she has been out of these medications for 4 days and has not been able to sleep. She is scheduled to see Dr Jonny RuizJohn on 06/19/2017. Please advise.

## 2017-06-05 NOTE — Telephone Encounter (Signed)
Ambien was [facility] from the pt's medication list [facility] After speaking with Ladona Ridgelaylor, she would need to be seen to be able to be put back on this medication if needed. I called pt and left her a message.

## 2017-06-06 NOTE — Telephone Encounter (Signed)
Spoke with the patient this morning. She said that Catherine Baker did not take her off the Ambien she only told her not to take it with Xanax... Please advise?

## 2017-06-06 NOTE — Telephone Encounter (Signed)
Pt has to come in to get that medication. [facility] or [facility] will not fill medication without an office visit. Pt also needs to establish. Also no guarantee that pt will get medication.

## 2017-06-06 NOTE — Telephone Encounter (Signed)
[facility].

## 2017-06-07 ENCOUNTER — Other Ambulatory Visit (INDEPENDENT_AMBULATORY_CARE_PROVIDER_SITE_OTHER): Payer: PRIVATE HEALTH INSURANCE

## 2017-06-07 ENCOUNTER — Encounter: Payer: Self-pay | Admitting: Nurse Practitioner

## 2017-06-07 ENCOUNTER — Ambulatory Visit (INDEPENDENT_AMBULATORY_CARE_PROVIDER_SITE_OTHER): Payer: PRIVATE HEALTH INSURANCE | Admitting: Nurse Practitioner

## 2017-06-07 VITALS — BP 120/70 | HR 65 | Temp 98.1°F | Ht 67.0 in | Wt 248.0 lb

## 2017-06-07 DIAGNOSIS — Z13220 Encounter for screening for lipoid disorders: Secondary | ICD-10-CM

## 2017-06-07 DIAGNOSIS — E039 Hypothyroidism, unspecified: Secondary | ICD-10-CM

## 2017-06-07 DIAGNOSIS — E119 Type 2 diabetes mellitus without complications: Secondary | ICD-10-CM

## 2017-06-07 DIAGNOSIS — M62830 Muscle spasm of back: Secondary | ICD-10-CM | POA: Diagnosis not present

## 2017-06-07 DIAGNOSIS — G4700 Insomnia, unspecified: Secondary | ICD-10-CM

## 2017-06-07 DIAGNOSIS — Z136 Encounter for screening for cardiovascular disorders: Secondary | ICD-10-CM

## 2017-06-07 DIAGNOSIS — I10 Essential (primary) hypertension: Secondary | ICD-10-CM

## 2017-06-07 LAB — T4, FREE: Free T4: 0.94 ng/dL (ref 0.60–1.60)

## 2017-06-07 LAB — BASIC METABOLIC PANEL
BUN: 18 mg/dL (ref 6–23)
CALCIUM: 9.7 mg/dL (ref 8.4–10.5)
CHLORIDE: 99 meq/L (ref 96–112)
CO2: 32 meq/L (ref 19–32)
Creatinine, Ser: 0.8 mg/dL (ref 0.40–1.20)
GFR: 92.7 mL/min (ref >60.00)
Glucose, Bld: 124 mg/dL — ABNORMAL HIGH (ref 70–99)
Potassium: 3.7 mEq/L (ref 3.5–5.1)
SODIUM: 138 meq/L (ref 135–145)

## 2017-06-07 LAB — LIPID PANEL
CHOL/HDL RATIO: 4
Cholesterol: 216 mg/dL — ABNORMAL HIGH (ref 0–200)
HDL: 57.3 mg/dL (ref >39.00)
LDL Cholesterol: 138 mg/dL — ABNORMAL HIGH (ref 0–99)
NONHDL: 158.53
Triglycerides: 103 mg/dL (ref 0.0–149.0)
VLDL: 20.6 mg/dL (ref 0.0–40.0)

## 2017-06-07 LAB — HEPATIC FUNCTION PANEL
ALK PHOS: 110 U/L (ref 39–117)
ALT: 13 U/L (ref 0–35)
AST: 15 U/L (ref 0–37)
Albumin: 3.9 g/dL (ref 3.5–5.2)
BILIRUBIN DIRECT: 0.1 mg/dL (ref 0.0–0.3)
BILIRUBIN TOTAL: 0.3 mg/dL (ref 0.2–1.2)
TOTAL PROTEIN: 7 g/dL (ref 6.0–8.3)

## 2017-06-07 LAB — HEMOGLOBIN A1C: Hgb A1c MFr Bld: 6.1 % (ref 4.6–6.5)

## 2017-06-07 LAB — TSH: TSH: 4.18 u[IU]/mL (ref 0.35–4.50)

## 2017-06-07 MED ORDER — ZOLPIDEM TARTRATE ER 12.5 MG PO TBCR: 12.5000 mg | EXTENDED_RELEASE_TABLET | Freq: Every evening | ORAL | 2 refills | Status: DC | PRN

## 2017-06-07 MED ORDER — METHOCARBAMOL 500 MG PO TABS: 500.0000 mg | ORAL_TABLET | Freq: Three times a day (TID) | ORAL | 0 refills | Status: DC | PRN

## 2017-06-07 NOTE — Patient Instructions (Addendum)
Please call [facility] for repeat mammogram.  Call insurance company about Cologuard test.  No change in HgbA1c. Lipid panel indicates elevation in total cholesterol and LDL. Need to maintain heart healthy diet and regular exercise in order to improve these numbers. Stable thyroid, hepatic and renal function  Please schedule appointment with opthalmology for diabetic eye exam.  Please establish with new nurse practitioner.  Do not use robaxin and ambien within 2hrs of each other

## 2017-06-07 NOTE — Progress Notes (Signed)
Subjective:  Patient ID: Catherine Baker, female    DOB: 05/06/1951  Age: 66 y.o. MRN: [id number]  CC: Medication Refill (medication consult: ambien and robaxin/)   HPI Denies any acute complains.  Insomnia: Due to night time work schedule. Denies any adverse side effects  HTN: Improved with telmisartan/HCTZ.  Back Pain: Chronic, waxing and waning, no new symptoms. Use of robaxin prn for muscle spasms.  Outpatient Medications Prior to Visit  Medication Sig Dispense Refill  . b complex vitamins tablet Take 1 tablet by mouth daily.    Marland Kitchen omega-3 acid ethyl esters (LOVAZA) 1 G capsule Take 1 g by mouth daily.    Marland Kitchen telmisartan-hydrochlorothiazide (MICARDIS HCT) 80-25 MG tablet Take 1 tablet by mouth daily. 90 tablet 0  . methocarbamol (ROBAXIN) 500 MG tablet TAKE 1 TABLET BY MOUTH EVERY 8 HOURS AS NEEDED FOR MUSCLE SPASM 40 tablet 0  . oxyCODONE-acetaminophen (PERCOCET) 10-325 MG tablet Take 1 tablet by mouth every 4 (four) hours as needed for pain.    Marland Kitchen ALPRAZolam (XANAX) 0.25 MG tablet Take 1-2 tablets (0.25-0.5 mg total) by mouth 2 (two) times daily as needed for anxiety. (Patient not taking: Reported on 06/07/2017) 21 tablet 0   No facility-administered medications prior to visit.     ROS Review of Systems  Respiratory: Negative for cough and sputum production.   Cardiovascular: Negative for chest pain and leg swelling.  Musculoskeletal: Positive for back pain. Negative for falls and myalgias.  Skin: Negative.   Neurological: Negative for headaches.  Psychiatric/Behavioral: Negative for depression and suicidal ideas. The patient has insomnia. The patient is not nervous/anxious.      Objective:  BP 120/70   Pulse 65   Temp 98.1 F (36.7 C)   Ht 5\' 7"  (1.702 m)   Wt 248 lb (112.5 kg)   SpO2 99%   BMI 38.84 kg/m   BP Readings from Last 3 Encounters:  06/07/17 120/70  04/22/17 (!) 170/100  02/19/17 119/69    Wt Readings from Last 3 Encounters:  06/07/17 248 lb  (112.5 kg)  04/22/17 248 lb (112.5 kg)  02/19/17 250 lb (113.4 kg)    Physical Exam  Constitutional: She is oriented to person, place, and time. No distress.  Cardiovascular: Normal rate and regular rhythm.   Pulmonary/Chest: Effort normal and breath sounds normal.  Musculoskeletal: She exhibits no edema.  Neurological: She is alert and oriented to person, place, and time.  Skin: Skin is warm and dry.  Vitals reviewed.   Lab Results  Component Value Date   WBC 8.2 07/26/2015   HGB 14.5 07/26/2015   HCT 43.6 07/26/2015   PLT 337 07/26/2015   GLUCOSE 124 (H) 06/07/2017   CHOL 216 (H) 06/07/2017   TRIG 103.0 06/07/2017   HDL 57.30 06/07/2017   LDLCALC 138 (H) 06/07/2017   ALT 13 06/07/2017   AST 15 06/07/2017   NA 138 06/07/2017   K 3.7 06/07/2017   CL 99 06/07/2017   CREATININE 0.80 06/07/2017   BUN 18 06/07/2017   CO2 32 06/07/2017   TSH 4.18 06/07/2017   HGBA1C 6.1 06/07/2017   MICROALBUR <0.7 04/04/2016    No results found.  Assessment & Plan:   Catherine Baker was seen today for medication refill.  Diagnoses and all orders for this visit:  Essential hypertension -     Basic metabolic panel; Future  Hypothyroidism, unspecified type -     TSH; Future -     T4, free; Future  Insomnia, unspecified  type -     zolpidem (AMBIEN CR) 12.5 MG CR tablet; Take 1 tablet (12.5 mg total) by mouth at bedtime as needed for sleep.  Muscle spasm of back -     methocarbamol (ROBAXIN) 500 MG tablet; Take 1 tablet (500 mg total) by mouth every 8 (eight) hours as needed for muscle spasms.  Type 2 diabetes mellitus without complication, without long-term current use of insulin (HCC) -     Hemoglobin A1c; Future  Encounter for lipid screening for cardiovascular disease -     Lipid panel; Future -     Hepatic function panel; Future   I have discontinued Catherine Baker's ALPRAZolam. I have also changed Catherine Baker methocarbamol. Additionally, I am having Catherine Baker start on zolpidem. Lastly, I am  having Catherine Baker maintain Catherine Baker omega-3 acid ethyl esters, b complex vitamins, oxyCODONE-acetaminophen, and telmisartan-hydrochlorothiazide.  Meds ordered this encounter  Medications  . zolpidem (AMBIEN CR) 12.5 MG CR tablet    Sig: Take 1 tablet (12.5 mg total) by mouth at bedtime as needed for sleep.    Dispense:  30 tablet    Refill:  2    Order Specific Question:   Supervising Provider    Answer:   Tresa GarterPLOTNIKOV, ALEKSEI V [[id number]]  . methocarbamol (ROBAXIN) 500 MG tablet    Sig: Take 1 tablet (500 mg total) by mouth every 8 (eight) hours as needed for muscle spasms.    Dispense:  90 tablet    Refill:  0    Please consider 90 day supplies to promote better adherence    Order Specific Question:   Supervising Provider    Answer:   Tresa GarterPLOTNIKOV, ALEKSEI V [[id number]]    Follow-up: Return in about 3 months (around 09/07/2017) for with New NP.  Alysia Pennaharlotte Alyus Mofield, NP

## 2017-06-19 ENCOUNTER — Ambulatory Visit: Payer: PRIVATE HEALTH INSURANCE | Admitting: Internal Medicine

## 2017-06-26 ENCOUNTER — Telehealth: Payer: Self-pay | Admitting: *Deleted

## 2017-06-26 NOTE — Telephone Encounter (Signed)
Rec'd fax stating Drug Therapy Change is needed for Telisartan/hctz 80/25mg . Pt benefit plan goal is to make sure to maintain quality of care while lowering out-of-pocket expenses. Therapeutic alternative are AnimatorLosartan/hctz and Irbesartan/hctz. Pt is needing appt w/new provider will discuss at OV...Raechel Chute/lmb

## 2017-07-15 ENCOUNTER — Emergency Department (HOSPITAL_BASED_OUTPATIENT_CLINIC_OR_DEPARTMENT_OTHER)
Admission: EM | Admit: 2017-07-15 | Discharge: 2017-07-15 | Discharge status: Against Medical Advice | Payer: PRIVATE HEALTH INSURANCE

## 2017-08-17 ENCOUNTER — Other Ambulatory Visit: Payer: Self-pay | Admitting: Family

## 2017-08-17 DIAGNOSIS — I10 Essential (primary) hypertension: Secondary | ICD-10-CM

## 2017-08-21 ENCOUNTER — Ambulatory Visit (INDEPENDENT_AMBULATORY_CARE_PROVIDER_SITE_OTHER): Payer: PRIVATE HEALTH INSURANCE | Admitting: Orthopedic Surgery

## 2017-08-21 ENCOUNTER — Encounter (INDEPENDENT_AMBULATORY_CARE_PROVIDER_SITE_OTHER): Payer: Self-pay | Admitting: Orthopedic Surgery

## 2017-08-21 VITALS — BP 136/67 | HR 64 | Resp 16 | Ht 67.0 in | Wt 248.0 lb

## 2017-08-21 DIAGNOSIS — M4726 Other spondylosis with radiculopathy, lumbar region: Secondary | ICD-10-CM

## 2017-08-21 DIAGNOSIS — G8929 Other chronic pain: Secondary | ICD-10-CM | POA: Diagnosis not present

## 2017-08-21 DIAGNOSIS — M5442 Lumbago with sciatica, left side: Secondary | ICD-10-CM | POA: Diagnosis not present

## 2017-08-21 MED ORDER — OXYCODONE-ACETAMINOPHEN 5-325 MG PO TABS: 1.0000 | ORAL_TABLET | ORAL | 0 refills | Status: DC | PRN

## 2017-08-21 NOTE — Progress Notes (Signed)
Office Visit Note   Patient: Catherine Baker           Date of Birth: [birth year]           MRN: [id number] Visit Date: 08/21/2017              Requested by: Veryl Speak, FNP [address] [place], [place] [address] PCP: Veryl Speak, FNP   Assessment & Plan: Visit Diagnoses:  1. Chronic left-sided low back pain with left-sided sciatica   2. Osteoarthritis of spine with radiculopathy, lumbar region     Plan:  #1: MRI scan with conscious sedation. I presented her with CT as well as CT myelography which had been suggested by the radiologist but she was not interested. #2: Given her prescription for oxycodone 05/325 #30 every 4 hours when necessary pain and I will not refill this.  Follow-Up Instructions: She will return after her  Orders:  Orders Placed This Encounter  Procedures  . MR Lumbar Spine w/o contrast   Meds ordered this encounter  Medications  . oxyCODONE-acetaminophen (PERCOCET/ROXICET) 5-325 MG tablet    Sig: Take 1 tablet by mouth every 4 (four) hours as needed for severe pain.    Dispense:  30 tablet    Refill:  0    Order Specific Question:   Supervising Provider    Answer:   Valeria Batman [[id number]]      Procedures: No procedures performed   Clinical Data: No additional findings.   Subjective: Chief Complaint  Patient presents with  . Left Hip - Pain  . Lower Back - Pain  . Hip Pain    Left hip pain x 6 months, difficulty walking, difficulty sleeping at night, numbness, did not have MRI due to claust, no injury, no surgery to hip, diabetic, Robaxin doesn't help  . Back Pain    HPI  Catherine Baker is 67 year old African-American female who returns today for evaluation of her low back pain with radiation to the left the buttock and left hip. I saw her back in 09/12/2016 with severe pain and discomfort and radicular type symptoms with numbness even into her foot. The numbness was in the great toe area. 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 His exacerbate her symptoms. At that time I had placed her with a Medrol Dosepak as well as Zofran and oxycodone.   She did profound relief with the Medrol Dosepak when seen on 09/18/2016 by Dr. Cleophas Dunker. The numbness was no longer present in her foot.  She had a recurrence of her symptoms in July 2018 and was seen. At that time was felt that an MRI scan would be indicated. However apparently she has tried to do an MRI scan even with Valium and was unable to perform this. She comes in today now complaining of similar pain and discomfort and was no what her next options are. Denies any bowel or bladder incontinence.   Review of Systems  Constitutional: Positive for appetite change.  HENT: Negative for trouble swallowing.   Eyes: Negative for pain.  Respiratory: Negative for shortness of breath.   Cardiovascular: Positive for leg swelling.  Gastrointestinal: Positive for constipation.  Endocrine: Positive for cold intolerance.  Genitourinary: Negative for difficulty urinating.  Musculoskeletal: Positive for back pain.  Skin: Negative for rash.  Allergic/Immunologic: Negative for food allergies.  Neurological: Positive for weakness and numbness.  Hematological: Does not bruise/bleed easily.  Psychiatric/Behavioral: Positive for sleep disturbance.     Objective: Vital Signs: BP 136/67 (BP Location: Right Arm, Patient Position: Sitting, Cuff Size: Normal)   Pulse 64   Resp 16   Ht 5\' 7"  (1.702 m)   Wt 248 lb (112.5 kg)   BMI 38.84 kg/m   Physical Exam  Constitutional: She is oriented to person, place, and time. She appears well-developed and well-nourished.  HENT:  Head: Normocephalic and atraumatic.  Eyes: EOM are normal. Pupils are equal, round, and reactive to light.  Pulmonary/Chest: Effort normal.  Neurological: She is alert and oriented to person, place, and  time.  Skin: Skin is warm and dry.  Psychiatric: She has a normal mood and affect. Her behavior is normal. Judgment and thought content normal.    Ortho Exam  Exam reveals her to be sitting comfortably in a chair at this time. She is limited with range of motion of her lumbar spine. Some pain with straight leg raising. Questionable weakness more left than on the right.  Specialty Comments:  No specialty comments available.  Imaging: No results found.   PMFS History: Patient Active Problem List   Diagnosis Date Noted  . Cough 07/10/2016  . Muscle spasm of back 03/28/2016  . Abnormal urine odor 04/25/2015  . Essential hypertension 10/22/2014  . Hypothyroidism 10/22/2014  . Insomnia 10/22/2014  . Type 2 diabetes mellitus (HCC) 10/22/2014   Past Medical History:  Diagnosis Date  . Diabetes mellitus without complication (HCC)   . Hypertension     Family History  Problem Relation Age of Onset  . Hypertension Mother   . Heart disease Mother   . Hyperlipidemia Mother   . Hypertension Father   . Heart disease Father   . Hyperlipidemia Father   . Breast cancer Maternal Grandmother     Past Surgical History:  Procedure Laterality Date  . ABDOMINAL HYSTERECTOMY    . TUBAL LIGATION     Social History   Occupational History  . Occupation: Nurse  Tobacco Use  . Smoking status: Former Smoker    Packs/day: 2.00    Years: 11.00    Pack years: 22.00    Types: Cigarettes    Last attempt to quit: 1990    Years since quitting: 29.0  . Smokeless tobacco: Never Used  Substance and Sexual Activity  . Alcohol use: Yes    Comment: occasional  . Drug use: No  . Sexual activity: Not on file   Current Outpatient Medications  Medication Sig Dispense Refill  . b complex vitamins tablet Take 1 tablet by mouth daily.    . methocarbamol (ROBAXIN) 500 MG tablet Take 1 tablet (500 mg total) by mouth every 8 (eight) hours as needed for muscle spasms. 90 tablet 0  . omega-3 acid ethyl  esters (LOVAZA) 1 G capsule Take 1 g by mouth daily.    Marland Kitchen telmisartan-hydrochlorothiazide (MICARDIS HCT) 80-25 MG tablet Take 1 tablet by mouth daily. 90 tablet 0  . zolpidem (AMBIEN CR) 12.5 MG CR tablet Take 1 tablet (12.5 mg total) by mouth at bedtime as needed for sleep. 30 tablet 2  . oxyCODONE-acetaminophen (PERCOCET) 10-325 MG tablet Take 1 tablet by mouth every 4 (four) hours as needed for pain.    Marland Kitchen oxyCODONE-acetaminophen (PERCOCET/ROXICET) 5-325 MG tablet Take 1 tablet by mouth every 4 (four) hours as needed for severe pain. 30 tablet 0   No current facility-administered medications  for this visit.

## 2017-08-27 ENCOUNTER — Telehealth: Payer: Self-pay | Admitting: Nurse Practitioner

## 2017-08-27 ENCOUNTER — Other Ambulatory Visit: Payer: Self-pay | Admitting: Nurse Practitioner

## 2017-08-27 DIAGNOSIS — I10 Essential (primary) hypertension: Secondary | ICD-10-CM

## 2017-08-27 MED ORDER — TELMISARTAN-HCTZ 80-25 MG PO TABS: 1.0000 | ORAL_TABLET | Freq: Every day | ORAL | 0 refills | Status: DC

## 2017-08-27 NOTE — Telephone Encounter (Signed)
rx sent in 30 days supply. Pt needs to keep her appt 08/30/2017 for more refills.

## 2017-08-27 NOTE — Telephone Encounter (Signed)
[facility] [address]  Patient is currently out of meds.  Has appointment to transfer this Friday.

## 2017-08-30 ENCOUNTER — Encounter: Payer: Self-pay | Admitting: Nurse Practitioner

## 2017-08-30 ENCOUNTER — Ambulatory Visit: Payer: PRIVATE HEALTH INSURANCE | Admitting: Nurse Practitioner

## 2017-08-30 VITALS — BP 102/66 | HR 93 | Temp 98.3°F | Ht 67.0 in | Wt 245.0 lb

## 2017-08-30 DIAGNOSIS — E119 Type 2 diabetes mellitus without complications: Secondary | ICD-10-CM

## 2017-08-30 DIAGNOSIS — M62830 Muscle spasm of back: Secondary | ICD-10-CM | POA: Diagnosis not present

## 2017-08-30 DIAGNOSIS — G4700 Insomnia, unspecified: Secondary | ICD-10-CM | POA: Diagnosis not present

## 2017-08-30 DIAGNOSIS — I10 Essential (primary) hypertension: Secondary | ICD-10-CM | POA: Diagnosis not present

## 2017-08-30 DIAGNOSIS — E039 Hypothyroidism, unspecified: Secondary | ICD-10-CM

## 2017-08-30 MED ORDER — TELMISARTAN-HCTZ 80-25 MG PO TABS: 1.0000 | ORAL_TABLET | Freq: Every day | ORAL | 1 refills | Status: DC

## 2017-08-30 MED ORDER — ZOLPIDEM TARTRATE ER 12.5 MG PO TBCR: 12.5000 mg | EXTENDED_RELEASE_TABLET | Freq: Every evening | ORAL | 2 refills | Status: DC | PRN

## 2017-08-30 MED ORDER — METHOCARBAMOL 500 MG PO TABS: 500.0000 mg | ORAL_TABLET | Freq: Three times a day (TID) | ORAL | 2 refills | Status: DC | PRN

## 2017-08-30 NOTE — Progress Notes (Signed)
Subjective:  Patient ID: Catherine MulliganBeverly Baker, female    DOB: [birth year]  Age: 67 y.o. MRN: [id number]  CC: Establish Care (transfer from Greg/ all medication refills)  HPI  Needs medication refilled  she denies any acute compliants.  HTN: Controlled with micardis HCT. BP Readings from Last 3 Encounters:  08/30/17 102/66  08/21/17 136/67  06/07/17 120/70   Chronic Back pain: Use of robaxin as needed.  Insomnia secondary to work schedule: Works at night. Adequate sleep with ambien.  Outpatient Medications Prior to Visit  Medication Sig Dispense Refill  . b complex vitamins tablet Take 1 tablet by mouth daily.    Marland Kitchen. omega-3 acid ethyl esters (LOVAZA) 1 G capsule Take 1 g by mouth daily.    Marland Kitchen. oxyCODONE-acetaminophen (PERCOCET/ROXICET) 5-325 MG tablet Take 1 tablet by mouth every 4 (four) hours as needed for severe pain. 30 tablet 0  . methocarbamol (ROBAXIN) 500 MG tablet Take 1 tablet (500 mg total) by mouth every 8 (eight) hours as needed for muscle spasms. 90 tablet 0  . telmisartan-hydrochlorothiazide (MICARDIS HCT) 80-25 MG tablet Take 1 tablet by mouth daily. 30 tablet 0  . zolpidem (AMBIEN CR) 12.5 MG CR tablet Take 1 tablet (12.5 mg total) by mouth at bedtime as needed for sleep. 30 tablet 2  . oxyCODONE-acetaminophen (PERCOCET) 10-325 MG tablet Take 1 tablet by mouth every 4 (four) hours as needed for pain.     No facility-administered medications prior to visit.     ROS See HPI  Objective:  BP 102/66   Pulse 93   Temp 98.3 F (36.8 C)   Ht 5\' 7"  (1.702 m)   Wt 245 lb (111.1 kg)   SpO2 99%   BMI 38.37 kg/m   BP Readings from Last 3 Encounters:  08/30/17 102/66  08/21/17 136/67  06/07/17 120/70    Wt Readings from Last 3 Encounters:  08/30/17 245 lb (111.1 kg)  08/21/17 248 lb (112.5 kg)  06/07/17 248 lb (112.5 kg)    Physical Exam  Constitutional: She is oriented to person, place, and time. No distress.  Cardiovascular: Normal rate and regular rhythm.   Pulmonary/Chest: Effort normal and breath sounds normal.  Neurological: She is alert and oriented to person, place, and time.  Vitals reviewed.   Lab Results  Component Value Date   WBC 8.2 07/26/2015   HGB 14.5 07/26/2015   HCT 43.6 07/26/2015   PLT 337 07/26/2015   GLUCOSE 124 (H) 06/07/2017   CHOL 216 (H) 06/07/2017   TRIG 103.0 06/07/2017   HDL 57.30 06/07/2017   LDLCALC 138 (H) 06/07/2017   ALT 13 06/07/2017   AST 15 06/07/2017   NA 138 06/07/2017   K 3.7 06/07/2017   CL 99 06/07/2017   CREATININE 0.80 06/07/2017   BUN 18 06/07/2017   CO2 32 06/07/2017   TSH 4.18 06/07/2017   HGBA1C 6.1 06/07/2017   MICROALBUR <0.7 04/04/2016    No results found.  Assessment & Plan:   Catherine Baker was seen today for establish care.  Diagnoses and all orders for this visit:  Essential hypertension -     telmisartan-hydrochlorothiazide (MICARDIS HCT) 80-25 MG tablet; Take 1 tablet by mouth daily.  Hypothyroidism, unspecified type  Type 2 diabetes mellitus without complication, without long-term current use of insulin (HCC)  Insomnia, unspecified type -     zolpidem (AMBIEN CR) 12.5 MG CR tablet; Take 1 tablet (12.5 mg total) by mouth at bedtime as needed for sleep.  Muscle spasm of  back -     methocarbamol (ROBAXIN) 500 MG tablet; Take 1 tablet (500 mg total) by mouth every 8 (eight) hours as needed for muscle spasms.   I am having Catherine Baker maintain her omega-3 acid ethyl esters, b complex vitamins, oxyCODONE-acetaminophen, oxyCODONE-acetaminophen, zolpidem, telmisartan-hydrochlorothiazide, and methocarbamol.  Meds ordered this encounter  Medications  . zolpidem (AMBIEN CR) 12.5 MG CR tablet    Sig: Take 1 tablet (12.5 mg total) by mouth at bedtime as needed for sleep.    Dispense:  30 tablet    Refill:  2    Order Specific Question:   Supervising Provider    Answer:   Dianne Dun [[id number]]  . telmisartan-hydrochlorothiazide (MICARDIS HCT) 80-25 MG tablet    Sig:  Take 1 tablet by mouth daily.    Dispense:  90 tablet    Refill:  1    Order Specific Question:   Supervising Provider    Answer:   Dianne Dun [[id number]]  . methocarbamol (ROBAXIN) 500 MG tablet    Sig: Take 1 tablet (500 mg total) by mouth every 8 (eight) hours as needed for muscle spasms.    Dispense:  30 tablet    Refill:  2    Order Specific Question:   Supervising Provider    Answer:   Dianne Dun [[id number]]    Follow-up: Return in about 3 months (around 11/28/2017) for CPE (fasting).  Alysia Penna, NP

## 2017-09-02 ENCOUNTER — Encounter: Payer: Self-pay | Admitting: Nurse Practitioner

## 2017-12-10 ENCOUNTER — Ambulatory Visit: Payer: Self-pay | Admitting: *Deleted

## 2017-12-10 NOTE — Telephone Encounter (Signed)
Pt hung up/ call lost during transfer from agent. Attempted to call pt back x 2;  Mailbox full.

## 2017-12-11 ENCOUNTER — Ambulatory Visit (INDEPENDENT_AMBULATORY_CARE_PROVIDER_SITE_OTHER): Payer: PRIVATE HEALTH INSURANCE | Admitting: Nurse Practitioner

## 2017-12-11 ENCOUNTER — Encounter: Payer: Self-pay | Admitting: Nurse Practitioner

## 2017-12-11 ENCOUNTER — Other Ambulatory Visit: Payer: Self-pay | Admitting: Nurse Practitioner

## 2017-12-11 VITALS — BP 118/74 | HR 72 | Temp 98.4°F | Ht 67.0 in | Wt 239.0 lb

## 2017-12-11 DIAGNOSIS — M62830 Muscle spasm of back: Secondary | ICD-10-CM

## 2017-12-11 DIAGNOSIS — Z13220 Encounter for screening for lipoid disorders: Secondary | ICD-10-CM

## 2017-12-11 DIAGNOSIS — I10 Essential (primary) hypertension: Secondary | ICD-10-CM

## 2017-12-11 DIAGNOSIS — R300 Dysuria: Secondary | ICD-10-CM | POA: Diagnosis not present

## 2017-12-11 DIAGNOSIS — G4700 Insomnia, unspecified: Secondary | ICD-10-CM

## 2017-12-11 DIAGNOSIS — E119 Type 2 diabetes mellitus without complications: Secondary | ICD-10-CM | POA: Diagnosis not present

## 2017-12-11 DIAGNOSIS — Z136 Encounter for screening for cardiovascular disorders: Secondary | ICD-10-CM | POA: Diagnosis not present

## 2017-12-11 DIAGNOSIS — E039 Hypothyroidism, unspecified: Secondary | ICD-10-CM | POA: Diagnosis not present

## 2017-12-11 DIAGNOSIS — Z1159 Encounter for screening for other viral diseases: Secondary | ICD-10-CM | POA: Diagnosis not present

## 2017-12-11 LAB — COMPREHENSIVE METABOLIC PANEL
ALT: 14 U/L (ref 0–35)
AST: 15 U/L (ref 0–37)
Albumin: 3.9 g/dL (ref 3.5–5.2)
Alkaline Phosphatase: 102 U/L (ref 39–117)
BILIRUBIN TOTAL: 0.7 mg/dL (ref 0.2–1.2)
BUN: 13 mg/dL (ref 6–23)
CALCIUM: 9.5 mg/dL (ref 8.4–10.5)
CO2: 29 meq/L (ref 19–32)
CREATININE: 0.75 mg/dL (ref 0.40–1.20)
Chloride: 102 mEq/L (ref 96–112)
GFR: 99.7 mL/min (ref >60.00)
GLUCOSE: 105 mg/dL — AB (ref 70–99)
Potassium: 3.6 mEq/L (ref 3.5–5.1)
SODIUM: 139 meq/L (ref 135–145)
Total Protein: 6.7 g/dL (ref 6.0–8.3)

## 2017-12-11 LAB — HEMOGLOBIN A1C: Hgb A1c MFr Bld: 6 % (ref 4.6–6.5)

## 2017-12-11 LAB — TSH: TSH: 2.47 u[IU]/mL (ref 0.35–4.50)

## 2017-12-11 LAB — MICROALBUMIN / CREATININE URINE RATIO
Creatinine,U: 234.1 mg/dL
MICROALB UR: 1 mg/dL (ref 0.0–1.9)
Microalb Creat Ratio: 0.4 mg/g (ref 0.0–30.0)

## 2017-12-11 LAB — CBC
HCT: 42.5 % (ref 36.0–46.0)
Hemoglobin: 14.3 g/dL (ref 12.0–15.0)
MCHC: 33.7 g/dL (ref 30.0–36.0)
MCV: 89.5 fl (ref 78.0–100.0)
Platelets: 353 10*3/uL (ref 150.0–400.0)
RBC: 4.74 Mil/uL (ref 3.87–5.11)
RDW: 13.3 % (ref 11.5–15.5)
WBC: 5.2 10*3/uL (ref 4.0–10.5)

## 2017-12-11 LAB — LIPID PANEL
CHOL/HDL RATIO: 4
Cholesterol: 216 mg/dL — ABNORMAL HIGH (ref 0–200)
HDL: 53.9 mg/dL (ref >39.00)
LDL CALC: 147 mg/dL — AB (ref 0–99)
NONHDL: 162.08
Triglycerides: 77 mg/dL (ref 0.0–149.0)
VLDL: 15.4 mg/dL (ref 0.0–40.0)

## 2017-12-11 MED ORDER — ZOLPIDEM TARTRATE ER 12.5 MG PO TBCR: 12.5000 mg | EXTENDED_RELEASE_TABLET | Freq: Every evening | ORAL | 2 refills | Status: DC | PRN

## 2017-12-11 NOTE — Patient Instructions (Addendum)
  Normal urinalysis and urine microalbumin except the need to increase oral hydration with water mostly. Stable CMP, TSH, and Hgb A1c. Persistent elevated LDL and total cholesterol. Need heart healthy diet and regular exercise.

## 2017-12-11 NOTE — Progress Notes (Signed)
Subjective:  Patient ID: Catherine Baker, female    DOB: [birth year]  Age: 67 y.o. MRN: [id number]  CC: Follow-up (med refills: robaxin,ambien and micardis/ out of work consult?)   Urinary Tract Infection   This is a new problem. The current episode started in the past 7 days. The problem occurs intermittently. The problem has been waxing and waning. The quality of the pain is described as aching. There has been no fever. She is not sexually active. There is no history of pyelonephritis. Pertinent negatives include no chills, discharge, flank pain, frequency, hematuria, hesitancy, nausea, possible pregnancy, sweats, urgency or vomiting. She has tried nothing for the symptoms. Her past medical history is significant for urinary stasis. There is no history of catheterization, kidney stones, recurrent UTIs, a single kidney or a urological procedure.    Ms. Mcgann reports increased stress at home. Stress is related to working full time and taking care of elderly father who was recently hospitalized. She will like 05/01-05/015 off  Work to help improve her state of mind. She will like time to make necessary arrangements for her father's care. She denies any depression or SI or HI.  HTN: At goal with micardis HCT. BP Readings from Last 3 Encounters:  12/11/17 118/74  08/30/17 102/66  08/21/17 136/67   Insomnia: Stable use of ambien prn at HS.  Chronic back pain: Use of robaxin prn.  Outpatient Medications Prior to Visit  Medication Sig Dispense Refill  . b complex vitamins tablet Take 1 tablet by mouth daily.    Marland Kitchen omega-3 acid ethyl esters (LOVAZA) 1 G capsule Take 1 g by mouth daily.    Marland Kitchen oxyCODONE-acetaminophen (PERCOCET) 10-325 MG tablet Take 1 tablet by mouth every 4 (four) hours as needed for pain.    Marland Kitchen oxyCODONE-acetaminophen (PERCOCET/ROXICET) 5-325 MG tablet Take 1 tablet by mouth every 4 (four) hours as needed for severe pain. 30 tablet 0  . methocarbamol (ROBAXIN) 500 MG tablet  Take 1 tablet (500 mg total) by mouth every 8 (eight) hours as needed for muscle spasms. 30 tablet 2  . telmisartan-hydrochlorothiazide (MICARDIS HCT) 80-25 MG tablet Take 1 tablet by mouth daily. 90 tablet 1  . zolpidem (AMBIEN CR) 12.5 MG CR tablet Take 1 tablet (12.5 mg total) by mouth at bedtime as needed for sleep. 30 tablet 2   No facility-administered medications prior to visit.     ROS See HPI  Objective:  BP 118/74   Pulse 72   Temp 98.4 F (36.9 C)   Ht  (1.702 m)   Wt 239 lb (108.4 kg)   SpO2 97%   BMI 37.43 kg/m   BP Readings from Last 3 Encounters:  12/11/17 118/74  08/30/17 102/66  08/21/17 136/67    Wt Readings from Last 3 Encounters:  12/11/17 239 lb (108.4 kg)  08/30/17 245 lb (111.1 kg)  08/21/17 248 lb (112.5 kg)    Physical Exam  Constitutional: She is oriented to person, place, and time. No distress.  Neck: Normal range of motion. Neck supple.  Cardiovascular: Normal rate, regular rhythm, normal heart sounds and intact distal pulses.  Pulmonary/Chest: Effort normal and breath sounds normal.  Abdominal: She exhibits no distension.  Musculoskeletal: She exhibits no edema.  Neurological: She is alert and oriented to person, place, and time.  Skin: Skin is warm and dry.  Normal diabetic foot exam  Psychiatric: She has a normal mood and affect. Her behavior is normal. Thought content normal.  Vitals reviewed.  Lab Results  Component Value Date   WBC 5.2 12/11/2017   HGB 14.3 12/11/2017   HCT 42.5 12/11/2017   PLT 353.0 12/11/2017   GLUCOSE 105 (H) 12/11/2017   CHOL 216 (H) 12/11/2017   TRIG 77.0 12/11/2017   HDL 53.90 12/11/2017   LDLCALC 147 (H) 12/11/2017   ALT 14 12/11/2017   AST 15 12/11/2017   NA 139 12/11/2017   K 3.6 12/11/2017   CL 102 12/11/2017   CREATININE 0.75 12/11/2017   BUN 13 12/11/2017   CO2 29 12/11/2017   TSH 2.47 12/11/2017   HGBA1C 6.0 12/11/2017   MICROALBUR 1.0 12/11/2017    Assessment & Plan:    Chancie was seen today for follow-up.  Diagnoses and all orders for this visit:  Essential hypertension -     CBC -     Comprehensive metabolic panel -     telmisartan-hydrochlorothiazide (MICARDIS HCT) 80-25 MG tablet; Take 1 tablet by mouth daily.  Hypothyroidism, unspecified type -     TSH  Type 2 diabetes mellitus without complication, without long-term current use of insulin (HCC) -     Hemoglobin A1c -     Microalbumin / creatinine urine ratio  Insomnia, unspecified type -     zolpidem (AMBIEN CR) 12.5 MG CR tablet; Take 1 tablet (12.5 mg total) by mouth at bedtime as needed for sleep.  Muscle spasm of back  Encounter for hepatitis C screening test for low risk patient  Encounter for lipid screening for cardiovascular disease -     Lipid panel  Dysuria -     Urinalysis w microscopic + reflex cultur   I am having Theresa Mulligan maintain her omega-3 acid ethyl esters, b complex vitamins, oxyCODONE-acetaminophen, oxyCODONE-acetaminophen, zolpidem, and telmisartan-hydrochlorothiazide.  Meds ordered this encounter  Medications  . zolpidem (AMBIEN CR) 12.5 MG CR tablet    Sig: Take 1 tablet (12.5 mg total) by mouth at bedtime as needed for sleep.    Dispense:  30 tablet    Refill:  2    Order Specific Question:   Supervising Provider    Answer:   Dianne Dun [[id number]]  . telmisartan-hydrochlorothiazide (MICARDIS HCT) 80-25 MG tablet    Sig: Take 1 tablet by mouth daily.    Dispense:  90 tablet    Refill:  0    Order Specific Question:   Supervising Provider    Answer:   Dianne Dun [[id number]]    Follow-up: Return in about 2 months (around 02/10/2018) for CPE(fasting).  Alysia Penna, NP

## 2017-12-12 ENCOUNTER — Encounter: Payer: Self-pay | Admitting: Nurse Practitioner

## 2017-12-12 LAB — URINALYSIS W MICROSCOPIC + REFLEX CULTURE
BACTERIA UA: NONE SEEN /HPF
Bilirubin Urine: NEGATIVE
Glucose, UA: NEGATIVE
HGB URINE DIPSTICK: NEGATIVE
Ketones, ur: NEGATIVE
Leukocyte Esterase: NEGATIVE
NITRITES URINE, INITIAL: NEGATIVE
Protein, ur: NEGATIVE
Specific Gravity, Urine: 1.029 (ref 1.001–1.03)
pH: 5.5 (ref 5.0–8.0)

## 2017-12-12 LAB — NO CULTURE INDICATED

## 2017-12-12 MED ORDER — TELMISARTAN-HCTZ 80-25 MG PO TABS: 1.0000 | ORAL_TABLET | Freq: Every day | ORAL | 0 refills | Status: DC

## 2018-02-10 ENCOUNTER — Encounter: Payer: PRIVATE HEALTH INSURANCE | Admitting: Nurse Practitioner

## 2018-03-08 ENCOUNTER — Other Ambulatory Visit: Payer: Self-pay | Admitting: Nurse Practitioner

## 2018-03-08 DIAGNOSIS — M62830 Muscle spasm of back: Secondary | ICD-10-CM

## 2018-03-10 NOTE — Telephone Encounter (Signed)
Ok for refills per [place].

## 2018-03-26 ENCOUNTER — Other Ambulatory Visit: Payer: Self-pay | Admitting: Nurse Practitioner

## 2018-03-26 DIAGNOSIS — I10 Essential (primary) hypertension: Secondary | ICD-10-CM

## 2018-03-26 MED ORDER — LOSARTAN POTASSIUM-HCTZ 100-25 MG PO TABS: 1.0000 | ORAL_TABLET | Freq: Every day | ORAL | 3 refills | Status: DC

## 2018-04-20 ENCOUNTER — Other Ambulatory Visit: Payer: Self-pay | Admitting: Nurse Practitioner

## 2018-04-20 DIAGNOSIS — G4700 Insomnia, unspecified: Secondary | ICD-10-CM

## 2018-04-21 NOTE — Telephone Encounter (Signed)
Last ov was 12/11/2017 and to follow up in 2 mo for CPE (no appt made).   Last refill given was 12/11/2017 for 3 mo supply. PMP: refills was 01/07/2018 for 30 tab, 02/12/2018 for 30 tab and 03/17/2018 for 30 tab at [facility] on [address].

## 2018-05-16 ENCOUNTER — Ambulatory Visit: Payer: PRIVATE HEALTH INSURANCE | Admitting: Nurse Practitioner

## 2018-05-16 ENCOUNTER — Encounter: Payer: Self-pay | Admitting: Nurse Practitioner

## 2018-05-16 VITALS — BP 138/80 | HR 100 | Temp 99.8°F | Ht 67.0 in | Wt 245.0 lb

## 2018-05-16 DIAGNOSIS — J101 Influenza due to other identified influenza virus with other respiratory manifestations: Secondary | ICD-10-CM | POA: Diagnosis not present

## 2018-05-16 DIAGNOSIS — J029 Acute pharyngitis, unspecified: Secondary | ICD-10-CM | POA: Diagnosis not present

## 2018-05-16 LAB — POC INFLUENZA A&B (BINAX/QUICKVUE)
INFLUENZA B, POC: NEGATIVE
Influenza A, POC: POSITIVE — AB

## 2018-05-16 LAB — POCT RAPID STREP A (OFFICE): RAPID STREP A SCREEN: NEGATIVE

## 2018-05-16 MED ORDER — KETOROLAC TROMETHAMINE 30 MG/ML IJ SOLN
30.0000 mg | Freq: Once | INTRAMUSCULAR | Status: AC
  Administered 2018-05-16: 30 mg via INTRAMUSCULAR

## 2018-05-16 MED ORDER — CHLORPHEN-PE-ACETAMINOPHEN 4-10-325 MG PO TABS: 1.0000 | ORAL_TABLET | Freq: Two times a day (BID) | ORAL | 0 refills | Status: AC

## 2018-05-16 MED ORDER — HYDROCODONE-HOMATROPINE 5-1.5 MG/5ML PO SYRP: 5.0000 mL | ORAL_SOLUTION | Freq: Every evening | ORAL | 0 refills | Status: DC | PRN

## 2018-05-16 MED ORDER — OSELTAMIVIR PHOSPHATE 75 MG PO CAPS: 75.0000 mg | ORAL_CAPSULE | Freq: Two times a day (BID) | ORAL | 0 refills | Status: DC

## 2018-05-16 NOTE — Progress Notes (Signed)
Subjective:  Patient ID: Catherine Baker, female    DOB: 04/07/1951  Age: 67 y.o. MRN: [id number]  CC: Nasal Congestion (patient is complaining of sore throat,chills 102.1,bodyache,non prductive mucus,congestion. 1 day/ took otc :tylenol,nyquil,zinc. )   URI   This is a new problem. The current episode started yesterday. The problem has been rapidly worsening. The maximum temperature recorded prior to Catherine Baker arrival was 100.4 - 100.9 F. Associated symptoms include congestion, coughing, ear pain, headaches, joint pain, a plugged ear sensation, rhinorrhea, sinus pain, sneezing, a sore throat and swollen glands. Pertinent negatives include no joint swelling, nausea, neck pain, vomiting or wheezing. She has tried decongestant and acetaminophen for the symptoms. The treatment provided no relief.    Reviewed past Medical, Social and Family history today.  Outpatient Medications Prior to Visit  Medication Sig Dispense Refill  . b complex vitamins tablet Take 1 tablet by mouth daily.    . methocarbamol (ROBAXIN) 500 MG tablet TAKE 1 TABLET BY MOUTH EVERY 12 HOURS AS NEEDED FOR MUSCLE SPASM 30 tablet 2  . omega-3 acid ethyl esters (LOVAZA) 1 G capsule Take 1 g by mouth daily.    Marland Kitchen telmisartan-hydrochlorothiazide (MICARDIS HCT) 80-25 MG tablet Take 1 tablet by mouth daily.  1  . zolpidem (AMBIEN CR) 12.5 MG CR tablet TAKE 1 TABLET BY MOUTH AT BEDTIME AS NEEDED FOR SLEEP 30 tablet 2  . losartan-hydrochlorothiazide (HYZAAR) 100-25 MG tablet Take 1 tablet by mouth daily. (Patient not taking: Reported on 05/16/2018) 90 tablet 3  . oxyCODONE-acetaminophen (PERCOCET) 10-325 MG tablet Take 1 tablet by mouth every 4 (four) hours as needed for pain.    Marland Kitchen oxyCODONE-acetaminophen (PERCOCET/ROXICET) 5-325 MG tablet Take 1 tablet by mouth every 4 (four) hours as needed for severe pain. (Patient not taking: Reported on 05/16/2018) 30 tablet 0   No facility-administered medications prior to visit.     ROS See  HPI  Objective:  BP 138/80   Pulse 100   Temp 99.8 F (37.7 C) (Oral)   Ht 5\' 7"  (1.702 m)   Wt 245 lb (111.1 kg)   SpO2 96%   BMI 38.37 kg/m   BP Readings from Last 3 Encounters:  05/16/18 138/80  12/11/17 118/74  08/30/17 102/66    Wt Readings from Last 3 Encounters:  05/16/18 245 lb (111.1 kg)  12/11/17 239 lb (108.4 kg)  08/30/17 245 lb (111.1 kg)    Physical Exam  Constitutional: She is oriented to person, place, and time. She appears well-developed and well-nourished.  HENT:  Right Ear: Tympanic membrane, external ear and ear canal normal.  Left Ear: Tympanic membrane, external ear and ear canal normal.  Nose: Mucosal edema and rhinorrhea present. Right sinus exhibits maxillary sinus tenderness. Right sinus exhibits no frontal sinus tenderness. Left sinus exhibits maxillary sinus tenderness. Left sinus exhibits no frontal sinus tenderness.  Mouth/Throat: Uvula is midline. No trismus in the jaw. Oropharyngeal exudate and posterior oropharyngeal erythema present. Tonsils are 2+ on the right. Tonsils are 2+ on the left.  Eyes: No scleral icterus.  Neck: Normal range of motion. Neck supple.  Cardiovascular: Normal rate and normal heart sounds.  Pulmonary/Chest: Effort normal and breath sounds normal.  Musculoskeletal: She exhibits no edema.  Lymphadenopathy:    She has cervical adenopathy.  Neurological: She is alert and oriented to person, place, and time.  Vitals reviewed.   Lab Results  Component Value Date   WBC 5.2 12/11/2017   HGB 14.3 12/11/2017   HCT 42.5 12/11/2017  PLT 353.0 12/11/2017   GLUCOSE 105 (H) 12/11/2017   CHOL 216 (H) 12/11/2017   TRIG 77.0 12/11/2017   HDL 53.90 12/11/2017   LDLCALC 147 (H) 12/11/2017   ALT 14 12/11/2017   AST 15 12/11/2017   NA 139 12/11/2017   K 3.6 12/11/2017   CL 102 12/11/2017   CREATININE 0.75 12/11/2017   BUN 13 12/11/2017   CO2 29 12/11/2017   TSH 2.47 12/11/2017   HGBA1C 6.0 12/11/2017   MICROALBUR 1.0  12/11/2017    Assessment & Plan:   Catherine Baker was seen today for nasal congestion.  Diagnoses and all orders for this visit:  Influenza A -     POC Influenza A&B(BINAX/QUICKVUE) -     ketorolac (TORADOL) 30 MG/ML injection 30 mg -     oseltamivir (TAMIFLU) 75 MG capsule; Take 1 capsule (75 mg total) by mouth 2 (two) times daily. -     HYDROcodone-homatropine (HYCODAN) 5-1.5 MG/5ML syrup; Take 5 mLs by mouth at bedtime as needed for cough. -     Chlorphen-PE-Acetaminophen 4-10-325 MG TABS; Take 1 tablet by mouth every 12 (twelve) hours for 3 days.  Sore throat -     POCT rapid strep A   I am having Catherine Baker start on oseltamivir, HYDROcodone-homatropine, and Chlorphen-PE-Acetaminophen. I am also having Catherine Baker maintain Catherine Baker omega-3 acid ethyl esters, b complex vitamins, oxyCODONE-acetaminophen, oxyCODONE-acetaminophen, methocarbamol, losartan-hydrochlorothiazide, zolpidem, and telmisartan-hydrochlorothiazide. We administered ketorolac.  Meds ordered this encounter  Medications  . ketorolac (TORADOL) 30 MG/ML injection 30 mg  . oseltamivir (TAMIFLU) 75 MG capsule    Sig: Take 1 capsule (75 mg total) by mouth 2 (two) times daily.    Dispense:  10 capsule    Refill:  0    Order Specific Question:   Supervising Provider    Answer:   MATTHEWS, CODY [[id number]]  . HYDROcodone-homatropine (HYCODAN) 5-1.5 MG/5ML syrup    Sig: Take 5 mLs by mouth at bedtime as needed for cough.    Dispense:  60 mL    Refill:  0    Order Specific Question:   Supervising Provider    Answer:   MATTHEWS, CODY [[id number]]  . Chlorphen-PE-Acetaminophen 4-10-325 MG TABS    Sig: Take 1 tablet by mouth every 12 (twelve) hours for 3 days.    Dispense:  6 tablet    Refill:  0    Order Specific Question:   Supervising Provider    Answer:   MATTHEWS, CODY [[id number]]   Follow-up: Return if symptoms worsen or fail to improve.  Alysia Penna, NP

## 2018-05-16 NOTE — Patient Instructions (Signed)

## 2018-05-21 ENCOUNTER — Telehealth: Payer: Self-pay | Admitting: Nurse Practitioner

## 2018-05-21 NOTE — Telephone Encounter (Signed)
Copied from CRM [id number]. Topic: Quick Communication - See Telephone Encounter >> May 21, 2018  2:20 PM Windy Kalata, NT wrote: CRM for notification. See Telephone encounter for: 05/21/18.  Patient is calling and was diagnosed with the flu on 05/16/18 she states she is still not feeling better and would like a note to return to work on 05/24/18. She states her note had her out until 05/21/18 but does not feel like she should return yet.

## 2018-05-22 NOTE — Telephone Encounter (Signed)
Patient came into office to pick up letter. Letter was not available, patient informed they will call when ready to pick up. Cindee Lame has already left for day and Nche out of office today. Patient also requesting that something else be called in to pharmacy for her. Robitussin not working. Please call and advise.

## 2018-05-23 ENCOUNTER — Encounter: Payer: Self-pay | Admitting: Nurse Practitioner

## 2018-05-23 MED ORDER — BENZONATATE 100 MG PO CAPS: 100.0000 mg | ORAL_CAPSULE | Freq: Three times a day (TID) | ORAL | 0 refills | Status: DC | PRN

## 2018-05-23 NOTE — Telephone Encounter (Signed)
Left detail message inform the pt, new rx sent to [facility]. Letter up front for the pt to pick up.

## 2018-05-23 NOTE — Telephone Encounter (Signed)
Ok to write work note? 

## 2018-06-06 ENCOUNTER — Encounter: Payer: Self-pay | Admitting: Nurse Practitioner

## 2018-06-06 ENCOUNTER — Ambulatory Visit: Payer: PRIVATE HEALTH INSURANCE | Admitting: Nurse Practitioner

## 2018-06-06 ENCOUNTER — Ambulatory Visit (INDEPENDENT_AMBULATORY_CARE_PROVIDER_SITE_OTHER): Payer: PRIVATE HEALTH INSURANCE

## 2018-06-06 VITALS — BP 126/76 | HR 72 | Temp 98.0°F | Ht 67.0 in | Wt 244.0 lb

## 2018-06-06 DIAGNOSIS — J0140 Acute pansinusitis, unspecified: Secondary | ICD-10-CM | POA: Diagnosis not present

## 2018-06-06 DIAGNOSIS — R509 Fever, unspecified: Secondary | ICD-10-CM

## 2018-06-06 DIAGNOSIS — J029 Acute pharyngitis, unspecified: Secondary | ICD-10-CM | POA: Diagnosis not present

## 2018-06-06 DIAGNOSIS — J101 Influenza due to other identified influenza virus with other respiratory manifestations: Secondary | ICD-10-CM | POA: Diagnosis not present

## 2018-06-06 LAB — CBC WITH DIFFERENTIAL/PLATELET
BASOS PCT: 0.8 % (ref 0.0–3.0)
Basophils Absolute: 0.1 10*3/uL (ref 0.0–0.1)
EOS PCT: 2.9 % (ref 0.0–5.0)
Eosinophils Absolute: 0.2 10*3/uL (ref 0.0–0.7)
HCT: 40.1 % (ref 36.0–46.0)
HEMOGLOBIN: 13.5 g/dL (ref 12.0–15.0)
Lymphocytes Relative: 30.8 % (ref 12.0–46.0)
Lymphs Abs: 2.2 10*3/uL (ref 0.7–4.0)
MCHC: 33.7 g/dL (ref 30.0–36.0)
MCV: 88.8 fl (ref 78.0–100.0)
MONO ABS: 0.8 10*3/uL (ref 0.1–1.0)
MONOS PCT: 11.3 % (ref 3.0–12.0)
Neutro Abs: 3.9 10*3/uL (ref 1.4–7.7)
Neutrophils Relative %: 54.2 % (ref 43.0–77.0)
Platelets: 372 10*3/uL (ref 150.0–400.0)
RBC: 4.52 Mil/uL (ref 3.87–5.11)
RDW: 13.3 % (ref 11.5–15.5)
WBC: 7.3 10*3/uL (ref 4.0–10.5)

## 2018-06-06 LAB — MONONUCLEOSIS SCREEN: Mono Screen: NEGATIVE

## 2018-06-06 MED ORDER — PREDNISONE 10 MG (21) PO TBPK: ORAL_TABLET | ORAL | 0 refills | Status: DC

## 2018-06-06 NOTE — Progress Notes (Signed)
Subjective:  Patient ID: Catherine Baker, female    DOB: 1951/03/05  Age: 67 y.o. MRN: [id number]  CC: Sore Throat (patient is complaining of sore throat,swelling glands, ears pressure, low fever,high BP,heart palpitation,bodyache. going on for since 05/16/2018--getting bad as of today. )   Mouth Lesions   The current episode started more than 2 weeks ago. The onset was gradual. The problem occurs continuously. The problem has been unchanged. Nothing relieves the symptoms. Nothing aggravates the symptoms. Associated symptoms include a fever, congestion, ear pain, headaches, mouth sores, rhinorrhea, sore throat, swollen glands, cough and URI. Pertinent negatives include no orthopnea, no decreased vision, no double vision, no eye itching, no photophobia, no abdominal pain, no ear discharge, no hearing loss, no stridor, no neck pain, no neck stiffness, no wheezing, no rash, no eye discharge, no eye pain and no eye redness.   Reviewed past Medical, Social and Family history today.  Outpatient Medications Prior to Visit  Medication Sig Dispense Refill  . b complex vitamins tablet Take 1 tablet by mouth daily.    . methocarbamol (ROBAXIN) 500 MG tablet TAKE 1 TABLET BY MOUTH EVERY 12 HOURS AS NEEDED FOR MUSCLE SPASM 30 tablet 2  . omega-3 acid ethyl esters (LOVAZA) 1 G capsule Take 1 g by mouth daily.    Marland Kitchen telmisartan-hydrochlorothiazide (MICARDIS HCT) 80-25 MG tablet Take 1 tablet by mouth daily.  1  . zolpidem (AMBIEN CR) 12.5 MG CR tablet TAKE 1 TABLET BY MOUTH AT BEDTIME AS NEEDED FOR SLEEP 30 tablet 2  . oxyCODONE-acetaminophen (PERCOCET) 10-325 MG tablet Take 1 tablet by mouth every 4 (four) hours as needed for pain.    . benzonatate (TESSALON) 100 MG capsule Take 1 capsule (100 mg total) by mouth every 8 (eight) hours as needed for cough. (Patient not taking: Reported on 06/06/2018) 20 capsule 0  . HYDROcodone-homatropine (HYCODAN) 5-1.5 MG/5ML syrup Take 5 mLs by mouth at bedtime as needed for  cough. (Patient not taking: Reported on 06/06/2018) 60 mL 0  . losartan-hydrochlorothiazide (HYZAAR) 100-25 MG tablet Take 1 tablet by mouth daily. (Patient not taking: Reported on 06/06/2018) 90 tablet 3  . oseltamivir (TAMIFLU) 75 MG capsule Take 1 capsule (75 mg total) by mouth 2 (two) times daily. (Patient not taking: Reported on 06/06/2018) 10 capsule 0  . oxyCODONE-acetaminophen (PERCOCET/ROXICET) 5-325 MG tablet Take 1 tablet by mouth every 4 (four) hours as needed for severe pain. (Patient not taking: Reported on 05/16/2018) 30 tablet 0   No facility-administered medications prior to visit.     ROS See HPI  Objective:  BP 126/76   Pulse 72   Temp 98 F (36.7 C) (Oral)   Ht 5\' 7"  (1.702 m)   Wt 244 lb (110.7 kg)   SpO2 97%   BMI 38.22 kg/m   BP Readings from Last 3 Encounters:  06/06/18 126/76  05/16/18 138/80  12/11/17 118/74    Wt Readings from Last 3 Encounters:  06/06/18 244 lb (110.7 kg)  05/16/18 245 lb (111.1 kg)  12/11/17 239 lb (108.4 kg)    Physical Exam  Constitutional: She is oriented to person, place, and time. She appears well-developed and well-nourished.  HENT:  Right Ear: Ear canal normal. No drainage. A middle ear effusion is present.  Left Ear: Ear canal normal. No drainage. A middle ear effusion is present.  Mouth/Throat: No oral lesions. No uvula swelling. Posterior oropharyngeal erythema present. No oropharyngeal exudate, posterior oropharyngeal edema or tonsillar abscesses.  Neck: Normal range of  motion. Neck supple.  Cardiovascular: Normal rate, regular rhythm and normal heart sounds.  Pulmonary/Chest: Effort normal and breath sounds normal.  Lymphadenopathy:    She has cervical adenopathy.  Neurological: She is alert and oriented to person, place, and time.  Skin: No rash noted.  Vitals reviewed.   Lab Results  Component Value Date   WBC 5.2 12/11/2017   HGB 14.3 12/11/2017   HCT 42.5 12/11/2017   PLT 353.0 12/11/2017   GLUCOSE  105 (H) 12/11/2017   CHOL 216 (H) 12/11/2017   TRIG 77.0 12/11/2017   HDL 53.90 12/11/2017   LDLCALC 147 (H) 12/11/2017   ALT 14 12/11/2017   AST 15 12/11/2017   NA 139 12/11/2017   K 3.6 12/11/2017   CL 102 12/11/2017   CREATININE 0.75 12/11/2017   BUN 13 12/11/2017   CO2 29 12/11/2017   TSH 2.47 12/11/2017   HGBA1C 6.0 12/11/2017   MICROALBUR 1.0 12/11/2017    Assessment & Plan:   Catherine Baker was seen today for sore throat.  Diagnoses and all orders for this visit:  Influenza A -     DG Chest 2 View  Fever, unspecified fever cause -     DG Chest 2 View -     Monospot -     CBC w/Diff  Sore throat -     Monospot -     CBC w/Diff -     predniSONE (STERAPRED UNI-PAK 21 TAB) 10 MG (21) TBPK tablet; As directed on package  Acute non-recurrent pansinusitis -     predniSONE (STERAPRED UNI-PAK 21 TAB) 10 MG (21) TBPK tablet; As directed on package   I have discontinued [facility] losartan-hydrochlorothiazide, oseltamivir, HYDROcodone-homatropine, and benzonatate. I am also having her start on predniSONE. Additionally, I am having her maintain her omega-3 acid ethyl esters, b complex vitamins, oxyCODONE-acetaminophen, methocarbamol, zolpidem, and telmisartan-hydrochlorothiazide.  Meds ordered this encounter  Medications  . predniSONE (STERAPRED UNI-PAK 21 TAB) 10 MG (21) TBPK tablet    Sig: As directed on package    Dispense:  21 tablet    Refill:  0    Order Specific Question:   Supervising Provider    Answer:   MATTHEWS, CODY [[id number]]    Follow-up: Return if symptoms worsen or fail to improve.  Alysia Penna, NP

## 2018-06-06 NOTE — Patient Instructions (Addendum)
Ok to use warm salt gaggle or ibuprofen or tylenol for sore throat.  Normal CXR. Oral prednisone sent. Pending cbc and mono

## 2018-07-24 ENCOUNTER — Other Ambulatory Visit: Payer: Self-pay | Admitting: Nurse Practitioner

## 2018-07-24 DIAGNOSIS — M62830 Muscle spasm of back: Secondary | ICD-10-CM

## 2018-07-24 DIAGNOSIS — G4700 Insomnia, unspecified: Secondary | ICD-10-CM

## 2018-07-24 NOTE — Telephone Encounter (Signed)
Catherine Baker please advise, last refill for Robaxin was 03/10/18 and PMP checked for ambien last 3 pick up : 04/21/18,05/21/18 and 06/24/18 30 tablets.

## 2018-08-28 ENCOUNTER — Telehealth: Payer: Self-pay | Admitting: Nurse Practitioner

## 2018-08-28 MED ORDER — ZOLPIDEM TARTRATE 10 MG PO TABS: 10.0000 mg | ORAL_TABLET | Freq: Every evening | ORAL | 2 refills | Status: DC | PRN

## 2018-08-28 NOTE — Telephone Encounter (Signed)
Patient called and advised of the note below from Phetcharat, LPN, patient verbalized understanding. She asks if there are side effects, I advised it is the same medication, but the 12.5 mg is CR is long acting, she verbalized understanding and says she tolerated the 12.5 mg.

## 2018-08-28 NOTE — Telephone Encounter (Signed)
Received a message from [employer] stating pt's benefit will not cover for Zolpidem 12.5 mg but they will cover for Zolpidem 10 mg tab or Trazodone 50 mg tab.   Charlotte change rx to Zolpidem 10 mg tab take 1 tab at bedtime PRN. New authorization faxed to [id number].   Left vm for the pt to call back, need to inform of message above.

## 2018-09-14 ENCOUNTER — Other Ambulatory Visit: Payer: Self-pay

## 2018-09-14 ENCOUNTER — Encounter (HOSPITAL_COMMUNITY): Payer: Self-pay | Admitting: Emergency Medicine

## 2018-09-14 ENCOUNTER — Emergency Department (HOSPITAL_COMMUNITY)
Admission: EM | Admit: 2018-09-14 | Discharge: 2018-09-14 | Disposition: A | Discharge status: Home / Self Care | Payer: PRIVATE HEALTH INSURANCE | Attending: Emergency Medicine | Admitting: Emergency Medicine

## 2018-09-14 ENCOUNTER — Emergency Department (HOSPITAL_COMMUNITY): Payer: PRIVATE HEALTH INSURANCE

## 2018-09-14 DIAGNOSIS — Y990 Civilian activity done for income or pay: Secondary | ICD-10-CM | POA: Diagnosis not present

## 2018-09-14 DIAGNOSIS — Z87891 Personal history of nicotine dependence: Secondary | ICD-10-CM | POA: Diagnosis not present

## 2018-09-14 DIAGNOSIS — I10 Essential (primary) hypertension: Secondary | ICD-10-CM | POA: Insufficient documentation

## 2018-09-14 DIAGNOSIS — Y929 Unspecified place or not applicable: Secondary | ICD-10-CM | POA: Diagnosis not present

## 2018-09-14 DIAGNOSIS — E119 Type 2 diabetes mellitus without complications: Secondary | ICD-10-CM | POA: Diagnosis not present

## 2018-09-14 DIAGNOSIS — S83001A Unspecified subluxation of right patella, initial encounter: Secondary | ICD-10-CM | POA: Insufficient documentation

## 2018-09-14 DIAGNOSIS — Y33XXXA Other specified events, undetermined intent, initial encounter: Secondary | ICD-10-CM | POA: Insufficient documentation

## 2018-09-14 DIAGNOSIS — Z79899 Other long term (current) drug therapy: Secondary | ICD-10-CM | POA: Insufficient documentation

## 2018-09-14 DIAGNOSIS — S83004A Unspecified dislocation of right patella, initial encounter: Secondary | ICD-10-CM

## 2018-09-14 DIAGNOSIS — S8991XA Unspecified injury of right lower leg, initial encounter: Secondary | ICD-10-CM | POA: Diagnosis present

## 2018-09-14 DIAGNOSIS — E039 Hypothyroidism, unspecified: Secondary | ICD-10-CM | POA: Diagnosis not present

## 2018-09-14 DIAGNOSIS — Y9301 Activity, walking, marching and hiking: Secondary | ICD-10-CM | POA: Diagnosis not present

## 2018-09-14 MED ORDER — NAPROXEN 250 MG PO TABS: ORAL_TABLET | ORAL | 0 refills | Status: DC

## 2018-09-14 MED ORDER — KETOROLAC TROMETHAMINE 30 MG/ML IJ SOLN
30.0000 mg | Freq: Once | INTRAMUSCULAR | Status: AC
  Administered 2018-09-14: 30 mg via INTRAMUSCULAR
  Filled 2018-09-14: qty 1

## 2018-09-14 MED ORDER — TRAMADOL HCL 50 MG PO TABS: ORAL_TABLET | ORAL | 0 refills | Status: DC

## 2018-09-14 NOTE — Discharge Instructions (Addendum)
Use ice packs to help the swelling go down and for comfort.  Take the tramadol 4 times a day with the naproxen.  Wear the knee immobilizer to stabilize your knee and to help the tendons that attached to the patella to strengthen and heal.  If you are still having difficulty walking you can rent a walker from pharmacy that has equipment rentals.  Please call your orthopedic doctor to be rechecked, call the office on Monday, February 3 to get an appointment.

## 2018-09-14 NOTE — ED Triage Notes (Signed)
Pt reports that right knee popped out of place while at work yesterday and was able to pop back in but then came out tonight and is not improving. Pt reports taking Robaxin without relief. Pulses and movement present distally from knee without abnormality.

## 2018-09-14 NOTE — ED Provider Notes (Signed)
[facility]-EMERGENCY DEPT Provider Note   CSN: [id number] Arrival date & time: 09/14/18  0005  Time seen 2:40 AM   History   Chief Complaint Chief Complaint  Patient presents with  . Knee Pain    HPI Catherine Baker is a 68 y.o. female.  HPI patient states yesterday at work she was walking and suddenly she felt like her right kneecap slipped medially out of place.  She states she pushed on it and got it to go back.  It happened twice.  She states it happened again this evening about 7 PM.  She states she feels like she has to walk keeping her knee straight.  She is never had this happen before.  She states she has had knee problems and has had steroids and Synvisc injections in her knees.  She states she is followed at "[facility]" by Dr. Broadus John.  The last time she was seen was about a year ago.  She states they told her she was "bone on bone".  She states they wanted to do an MRI of her knee however she was so claustrophobic she could not tolerate the MRI.  They then wanted to do it under anesthesia however she had life events happen and never followed through.  She states she can walk however it is very painful.  She states she feels like her knee is "going to split".  PCP Nche, Bonna Gains, NP   Past Medical History:  Diagnosis Date  . Diabetes mellitus without complication (HCC)   . Hypertension     Patient Active Problem List   Diagnosis Date Noted  . Cough 07/10/2016  . Muscle spasm of back 03/28/2016  . Abnormal urine odor 04/25/2015  . Essential hypertension 10/22/2014  . Hypothyroidism 10/22/2014  . Insomnia 10/22/2014  . Type 2 diabetes mellitus (HCC) 10/22/2014    Past Surgical History:  Procedure Laterality Date  . ABDOMINAL HYSTERECTOMY    . TUBAL LIGATION       OB History   No obstetric history on file.      Home Medications    Prior to Admission medications   Medication Sig Start Date End Date Taking? Authorizing  Provider  b complex vitamins tablet Take 1 tablet by mouth daily.    [provider]  methocarbamol (ROBAXIN) 500 MG tablet Take 1 tablet (500 mg total) by mouth daily as needed for muscle spasms. 07/25/18   Nche, Bonna Gains, NP  naproxen (NAPROSYN) 250 MG tablet Take 1 po BID with food prn pain 09/14/18   Devoria Albe, MD  omega-3 acid ethyl esters (LOVAZA) 1 G capsule Take 1 g by mouth daily.    [provider]  oxyCODONE-acetaminophen (PERCOCET) 10-325 MG tablet Take 1 tablet by mouth every 4 (four) hours as needed for pain.    [provider]  predniSONE (STERAPRED UNI-PAK 21 TAB) 10 MG (21) TBPK tablet As directed on package 06/06/18   Nche, Bonna Gains, NP  telmisartan-hydrochlorothiazide (MICARDIS HCT) 80-25 MG tablet Take 1 tablet by mouth daily. 04/20/18   [provider]  traMADol (ULTRAM) 50 MG tablet Take 1 or 2 po Q 6hrs for pain 09/14/18   Devoria Albe, MD  zolpidem (AMBIEN) 10 MG tablet Take 1 tablet (10 mg total) by mouth at bedtime as needed for up to 30 days for sleep. 08/28/18 09/27/18  Nche, Bonna Gains, NP    Family History Family History  Problem Relation Age of Onset  . Hypertension Mother   .  Heart disease Mother   . Hyperlipidemia Mother   . Hypertension Father   . Heart disease Father   . Hyperlipidemia Father   . Breast cancer Maternal Grandmother     Social History Social History   Tobacco Use  . Smoking status: Former Smoker    Packs/day: 2.00    Years: 11.00    Pack years: 22.00    Types: Cigarettes    Last attempt to quit: 1990    Years since quitting: 30.1  . Smokeless tobacco: Never Used  Substance Use Topics  . Alcohol use: Yes    Comment: occasional  . Drug use: No  employed   Allergies   Codeine   Review of Systems Review of Systems  All other systems reviewed and are negative.    Physical Exam Updated Vital Signs BP (!) 150/90 (BP Location: Left Arm)   Pulse 70   Temp 98.6 F (37 C) (Oral)    Resp 18   Ht 5\' 7"  (1.702 m)   Wt 110.7 kg   SpO2 100%   BMI 38.22 kg/m   Vital signs normal except for hypertension  Physical Exam Vitals signs and nursing note reviewed.  Constitutional:      Appearance: Normal appearance. She is obese.  HENT:     Head: Normocephalic and atraumatic.     Right Ear: External ear normal.     Left Ear: External ear normal.     Nose: Nose normal.  Eyes:     Extraocular Movements: Extraocular movements intact.     Conjunctiva/sclera: Conjunctivae normal.  Neck:     Musculoskeletal: Normal range of motion.  Cardiovascular:     Rate and Rhythm: Normal rate.  Pulmonary:     Effort: Pulmonary effort is normal. No respiratory distress.  Musculoskeletal:        General: Swelling and tenderness present. No deformity.     Comments: Patient has good distal pulses.  She states she has some minor change in sensation along her right great toe and the medial aspect of her foot.  When I examine her knee she does appear to have a effusion.  There is no crepitance felt.  She has intact straight leg raising.  Skin:    General: Skin is warm and dry.     Findings: No bruising or erythema.  Neurological:     General: No focal deficit present.     Mental Status: She is alert and oriented to person, place, and time.     Cranial Nerves: No cranial nerve deficit.  Psychiatric:        Mood and Affect: Mood normal.        Behavior: Behavior normal.        Thought Content: Thought content normal.      ED Treatments / Results  Labs (all labs ordered are listed, but only abnormal results are displayed) Labs Reviewed - No data to display  EKG None  Radiology Dg Knee Complete 4 Views Right  Result Date: 09/14/2018 CLINICAL DATA:  Right knee popped out of place while at work yesterday. Spontaneously reduced. Came out again tonight. EXAM: RIGHT KNEE - COMPLETE 4+ VIEW COMPARISON:  None. FINDINGS: Degenerative changes in the right knee with medial compartment  narrowing and osteophyte formation. Patellar osteophytes. No evidence of acute fracture or dislocation. No focal bone lesion or bone destruction. Large right knee effusion. IMPRESSION: Degenerative changes in the right knee. Large right knee effusion. No acute bony abnormalities. Electronically Signed  By: Burman Nieves M.D.   On: 09/14/2018 00:51    Procedures Procedures (including critical care time)  Medications Ordered in ED Medications  ketorolac (TORADOL) 30 MG/ML injection 30 mg (30 mg Intramuscular Given 09/14/18 0323)     Initial Impression / Assessment and Plan / ED Course  I have reviewed the triage vital signs and the nursing notes.  Pertinent labs & imaging results that were available during my care of the patient were reviewed by me and considered in my medical decision making (see chart for details).    Patient was given Toradol IM for pain.  She was placed in a knee immobilizer.  She is advised to follow-up with her orthopedist.  She should use ice packs to reduce the swelling.  Final Clinical Impressions(s) / ED Diagnoses   Final diagnoses:  Patellar subluxation, right, initial encounter  Dislocation of right patella, initial encounter    ED Discharge Orders         Ordered    traMADol (ULTRAM) 50 MG tablet     09/14/18 0328    naproxen (NAPROSYN) 250 MG tablet     09/14/18 0328         Plan discharge  Devoria Albe, MD, Concha Pyo, MD 09/14/18 [phone]

## 2018-09-23 ENCOUNTER — Other Ambulatory Visit: Payer: Self-pay | Admitting: Nurse Practitioner

## 2018-09-23 NOTE — Telephone Encounter (Signed)
Charlotte please advise 

## 2018-09-28 ENCOUNTER — Other Ambulatory Visit: Payer: Self-pay | Admitting: Nurse Practitioner

## 2018-09-28 DIAGNOSIS — G4700 Insomnia, unspecified: Secondary | ICD-10-CM

## 2018-09-30 ENCOUNTER — Telehealth: Payer: Self-pay | Admitting: Nurse Practitioner

## 2018-09-30 ENCOUNTER — Other Ambulatory Visit: Payer: Self-pay | Admitting: Nurse Practitioner

## 2018-09-30 DIAGNOSIS — G4700 Insomnia, unspecified: Secondary | ICD-10-CM

## 2018-09-30 NOTE — Telephone Encounter (Signed)
Copied from CRM [id number]. Topic: Quick Communication - Rx Refill/Question >> Sep 30, 2018  6:07 PM Jens Som A wrote: Medication: zolpidem (AMBIEN) 10 MG tablet [[id number]]  ENDED   Has the patient contacted their pharmacy? Yes  (Agent: If no, request that the patient contact the pharmacy for the refill.) (Agent: If yes, when and what did the pharmacy advise?)  Preferred Pharmacy (with phone number or street name): [facility] [address] [place], [place] - [address] [phone] (Phone) [phone] (Fax)    Agent: Please be advised that RX refills may take up to 3 business days. We ask that you follow-up with your pharmacy.

## 2018-10-01 NOTE — Telephone Encounter (Signed)
appt made 10/02/2018

## 2018-10-01 NOTE — Telephone Encounter (Signed)
Pt has an appt with nche 10/02/18

## 2018-10-01 NOTE — Telephone Encounter (Signed)
Patient is calling in to check the status of her medication  Zolpidem (Ambien) 10MG . The medication was requested on 09-28-2018. she stated she is unable to schedule an appt  Due to a knee injury that happen on 09-14-2018 and she is on workers comp. . She stated if her daughter needs to come in and pick up the prescription she can for her, if it can not be called in . The patient is wanting a call back with a update status on her RX. Please advise   Best contact number is [phone]

## 2018-10-01 NOTE — Telephone Encounter (Signed)
If she does not have a note from ortho that indicates she is on bedrest, she needs to maintain her appts with me in order to get additional refills

## 2018-10-01 NOTE — Telephone Encounter (Signed)
Patient is calling in to check the status of her medication  Zolpidem (Ambien) 10MG. The medication was requested on 09-28-2018. she stated she is unable to schedule an appt  Due to a knee injury that happen on 09-14-2018 and she is on workers comp. . She stated if her daughter needs to come in and pick up the prescription she can for her, if it can not be called in . The patient is wanting a call back with a update status on her RX. Please advise  ° °Best contact number is [phone] °

## 2018-10-01 NOTE — Telephone Encounter (Signed)
PMP checked, last 3 pick up was 06/24/18,07/25/18 and 08/29/2018 for 30 tab at [facility]. Last ov was 10.25.19, no future appt. Please advise.

## 2018-10-01 NOTE — Telephone Encounter (Signed)
Left detail message inform the pt that Catherine Baker wants to see her before she can refill this medication. She is aware of knee injury. Pt needs to make an appt. Ok for triage to give detail message.

## 2018-10-02 ENCOUNTER — Ambulatory Visit (INDEPENDENT_AMBULATORY_CARE_PROVIDER_SITE_OTHER): Payer: PRIVATE HEALTH INSURANCE | Admitting: Nurse Practitioner

## 2018-10-02 ENCOUNTER — Encounter: Payer: Self-pay | Admitting: Nurse Practitioner

## 2018-10-02 VITALS — BP 120/76 | HR 106 | Temp 99.9°F | Ht 67.0 in | Wt 246.0 lb

## 2018-10-02 DIAGNOSIS — I10 Essential (primary) hypertension: Secondary | ICD-10-CM | POA: Diagnosis not present

## 2018-10-02 DIAGNOSIS — J00 Acute nasopharyngitis [common cold]: Secondary | ICD-10-CM

## 2018-10-02 DIAGNOSIS — E039 Hypothyroidism, unspecified: Secondary | ICD-10-CM | POA: Diagnosis not present

## 2018-10-02 DIAGNOSIS — G4726 Circadian rhythm sleep disorder, shift work type: Secondary | ICD-10-CM

## 2018-10-02 DIAGNOSIS — G4700 Insomnia, unspecified: Secondary | ICD-10-CM | POA: Diagnosis not present

## 2018-10-02 DIAGNOSIS — Z1159 Encounter for screening for other viral diseases: Secondary | ICD-10-CM

## 2018-10-02 DIAGNOSIS — Z1231 Encounter for screening mammogram for malignant neoplasm of breast: Secondary | ICD-10-CM

## 2018-10-02 DIAGNOSIS — E119 Type 2 diabetes mellitus without complications: Secondary | ICD-10-CM | POA: Diagnosis not present

## 2018-10-02 DIAGNOSIS — E782 Mixed hyperlipidemia: Secondary | ICD-10-CM | POA: Diagnosis not present

## 2018-10-02 DIAGNOSIS — Z1211 Encounter for screening for malignant neoplasm of colon: Secondary | ICD-10-CM

## 2018-10-02 LAB — T4, FREE: Free T4: 0.91 ng/dL (ref 0.60–1.60)

## 2018-10-02 LAB — POC INFLUENZA A&B (BINAX/QUICKVUE)
Influenza A, POC: NEGATIVE
Influenza B, POC: NEGATIVE

## 2018-10-02 LAB — HEPATIC FUNCTION PANEL
ALT: 13 U/L (ref 0–35)
AST: 12 U/L (ref 0–37)
Albumin: 4.1 g/dL (ref 3.5–5.2)
Alkaline Phosphatase: 118 U/L — ABNORMAL HIGH (ref 39–117)
Bilirubin, Direct: 0.1 mg/dL (ref 0.0–0.3)
Total Bilirubin: 0.4 mg/dL (ref 0.2–1.2)
Total Protein: 7.1 g/dL (ref 6.0–8.3)

## 2018-10-02 LAB — LIPID PANEL
Cholesterol: 234 mg/dL — ABNORMAL HIGH (ref 0–200)
HDL: 57 mg/dL (ref >39.00)
LDL Cholesterol: 163 mg/dL — ABNORMAL HIGH (ref 0–99)
NonHDL: 176.68
Total CHOL/HDL Ratio: 4
Triglycerides: 69 mg/dL (ref 0.0–149.0)
VLDL: 13.8 mg/dL (ref 0.0–40.0)

## 2018-10-02 LAB — BASIC METABOLIC PANEL
BUN: 16 mg/dL (ref 6–23)
CO2: 29 mEq/L (ref 19–32)
Calcium: 9.6 mg/dL (ref 8.4–10.5)
Chloride: 99 mEq/L (ref 96–112)
Creatinine, Ser: 0.98 mg/dL (ref 0.40–1.20)
GFR: 68.72 mL/min (ref >60.00)
GLUCOSE: 97 mg/dL (ref 70–99)
Potassium: 4.3 mEq/L (ref 3.5–5.1)
Sodium: 139 mEq/L (ref 135–145)

## 2018-10-02 LAB — MICROALBUMIN / CREATININE URINE RATIO
Creatinine,U: 132.2 mg/dL
Microalb Creat Ratio: 0.5 mg/g (ref 0.0–30.0)
Microalb, Ur: <0.7 mg/dL (ref 0.0–1.9)

## 2018-10-02 LAB — TSH: TSH: 3.84 u[IU]/mL (ref 0.35–4.50)

## 2018-10-02 LAB — T3, FREE: T3, Free: 3.2 pg/mL (ref 2.3–4.2)

## 2018-10-02 LAB — HEMOGLOBIN A1C: Hgb A1c MFr Bld: 5.9 % (ref 4.6–6.5)

## 2018-10-02 MED ORDER — ZOLPIDEM TARTRATE 10 MG PO TABS: 10.0000 mg | ORAL_TABLET | Freq: Every evening | ORAL | 5 refills | Status: DC | PRN

## 2018-10-02 MED ORDER — BENZONATATE 100 MG PO CAPS: 100.0000 mg | ORAL_CAPSULE | Freq: Three times a day (TID) | ORAL | 0 refills | Status: DC | PRN

## 2018-10-02 MED ORDER — OXYMETAZOLINE HCL 0.05 % NA SOLN: 1.0000 | Freq: Two times a day (BID) | NASAL | 0 refills | Status: DC

## 2018-10-02 MED ORDER — FLUTICASONE PROPIONATE 50 MCG/ACT NA SUSP: 2.0000 | Freq: Every day | NASAL | 0 refills | Status: DC

## 2018-10-02 NOTE — Assessment & Plan Note (Signed)
Reports adequate sleep with ambien. Used every 1-2days. 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 She is aware about need to change medication dose (12.5mg  CR to 10mg ) due to insurance coverage.

## 2018-10-02 NOTE — Patient Instructions (Addendum)
We will ger exam report from [facility] in [place].  Negative hep C, normal thyroid function, normal renal and liver function, normal urine micro albumin. DM controlled with hgbA1c of 5.9. Lipid panel indicates persistent elevated total cholesterol and LDL. With HTN and DM, it is important to decrease LDL to <100 in order to minimize risk for cardiovascular disease. In addition to heart healthy diet, I recommend discontinuation of omega fatty acid and starting pravastatin.  Let me know if you are ok with me sending prescription.  Continue current medications and f/up in 43months.  You will be contacted to schedule appt for mammogram, Bone density and colonoscopy.  URI Instructions: Flonase and Afrin use: apply 1spray of afrin in each nare, wait , then apply 2sprays of flonase in each nare. Use both nasal spray consecutively x 3days, then flonase only for at least 14days.  Encourage adequate oral hydration.  Use over-the-counter  "cold" medicines  such as "Tylenol cold" , "Advil cold",  "Mucinex" or" Mucinex D"  for cough and congestion.  Avoid decongestants if you have high blood pressure. Use" Delsym" or" Robitussin" cough syrup varietis for cough.  You can use plain "Tylenol" or "Advi"l for fever, chills and achyness.   "Common cold" symptoms are usually triggered by a virus.  The antibiotics are usually not necessary. On average, a" viral cold" illness would take 4-7 days to resolve. Please, make an appointment if you are not better or if you're worse.

## 2018-10-02 NOTE — Assessment & Plan Note (Addendum)
No medication used in last 86yrs.  Normal TSH, T3, and T4.  Continue to monitor once a year

## 2018-10-02 NOTE — Assessment & Plan Note (Addendum)
controlled with micardis-HCT. BP Readings from Last 3 Encounters:  10/02/18 120/76  09/14/18 (!) 150/88  06/06/18 126/76   Normal BMP. F/up in 21months

## 2018-10-02 NOTE — Progress Notes (Signed)
Subjective:  Patient ID: Catherine Baker, female    DOB: 07-May-1951  Age: 68 y.o. MRN: [id number]  CC: Medication Refill (ambien refill consult) and Cough (pt is c/o of coughing,sore throat,burning sensation on chest area,congestions/ going on 1 day/ took nightquil.)  URI   This is a new problem. The current episode started yesterday. The problem has been unchanged. The maximum temperature recorded prior to her arrival was 100.4 - 100.9 F. Associated symptoms include congestion, coughing, ear pain, headaches, joint pain, a plugged ear sensation, rhinorrhea, sinus pain, sneezing, a sore throat and swollen glands. Pertinent negatives include no vomiting or wheezing. She has tried decongestant for the symptoms. The treatment provided no relief.   HTN: controlled with micardis-HCT. BP Readings from Last 3 Encounters:  10/02/18 120/76  09/14/18 (!) 150/88  06/06/18 126/76   DM: Controlled with diet. Last hgbA1c 6.0 Reports eye exam done [facility] last year, normal per patient, no neuropathy. Negative urine microalbumin.  Hypothyroidism: No medication used in last 27yrs.  Need repeat thyroid function.  Insomnia: Reports adequate sleep with ambien. Used every 1-2days. 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 She is aware about need to change medication dose (12.5mg  CR to 10mg ) due to insurance coverage  Reviewed past Medical, Social and Family history today.  Outpatient Medications Prior to Visit  Medication Sig Dispense Refill  . b complex vitamins tablet Take 1 tablet by mouth daily.    . methocarbamol (ROBAXIN) 750 MG tablet     . omega-3 acid ethyl esters (LOVAZA) 1 G capsule Take 1 g by mouth daily.    Marland Kitchen telmisartan-hydrochlorothiazide (MICARDIS HCT) 80-25 MG tablet Take 1 tablet by mouth daily. Need office for additional refills 90 tablet 0  . oxyCODONE-acetaminophen (PERCOCET) 10-325 MG tablet Take 1 tablet by mouth every 4 (four) hours as needed for pain.     . methocarbamol (ROBAXIN) 500 MG tablet Take 1 tablet (500 mg total) by mouth daily as needed for muscle spasms. (Patient not taking: Reported on 10/02/2018) 30 tablet 1  . naproxen (NAPROSYN) 250 MG tablet Take 1 po BID with food prn pain (Patient not taking: Reported on 10/02/2018) 20 tablet 0  . predniSONE (STERAPRED UNI-PAK 21 TAB) 10 MG (21) TBPK tablet As directed on package (Patient not taking: Reported on 10/02/2018) 21 tablet 0  . traMADol (ULTRAM) 50 MG tablet Take 1 or 2 po Q 6hrs for pain (Patient not taking: Reported on 10/02/2018) 16 tablet 0  . zolpidem (AMBIEN) 10 MG tablet Take 1 tablet (10 mg total) by mouth at bedtime as needed for up to 30 days for sleep. 30 tablet 2   No facility-administered medications prior to visit.     ROS See HPI  Objective:  BP 120/76   Pulse (!) 106   Temp 99.9 F (37.7 C) (Oral)   Ht 5\' 7"  (1.702 m)   Wt 246 lb (111.6 kg)   SpO2 98%   BMI 38.53 kg/m   BP Readings from Last 3 Encounters:  10/02/18 120/76  09/14/18 (!) 150/88  06/06/18 126/76    Wt Readings from Last 3 Encounters:  10/02/18 246 lb (111.6 kg)  09/14/18 244 lb (110.7 kg)  06/06/18 244 lb (110.7 kg)    Physical Exam Vitals signs reviewed.  HENT:     Right Ear: Tympanic membrane, ear canal and external ear normal.     Left Ear: Tympanic membrane, ear canal and external ear normal.  Nose: Mucosal edema, congestion and rhinorrhea present.     Right Turbinates: Swollen.     Left Turbinates: Swollen.     Right Sinus: Maxillary sinus tenderness present.     Left Sinus: Maxillary sinus tenderness present.     Mouth/Throat:     Pharynx: Posterior oropharyngeal erythema present. No oropharyngeal exudate.  Cardiovascular:     Rate and Rhythm: Normal rate and regular rhythm.     Pulses: Normal pulses.     Heart sounds: Normal heart sounds.  Pulmonary:     Effort: Pulmonary effort is normal.     Breath sounds: Normal breath sounds.  Musculoskeletal:     Right  lower leg: No edema.     Left lower leg: No edema.  Lymphadenopathy:     Cervical: No cervical adenopathy.  Neurological:     Mental Status: She is alert and oriented to person, place, and time.  Psychiatric:        Mood and Affect: Mood normal.        Behavior: Behavior normal.        Thought Content: Thought content normal.     Lab Results  Component Value Date   WBC 7.3 06/06/2018   HGB 13.5 06/06/2018   HCT 40.1 06/06/2018   PLT 372.0 06/06/2018   GLUCOSE 97 10/02/2018   CHOL 234 (H) 10/02/2018   TRIG 69.0 10/02/2018   HDL 57.00 10/02/2018   LDLCALC 163 (H) 10/02/2018   ALT 13 10/02/2018   AST 12 10/02/2018   NA 139 10/02/2018   K 4.3 10/02/2018   CL 99 10/02/2018   CREATININE 0.98 10/02/2018   BUN 16 10/02/2018   CO2 29 10/02/2018   TSH 3.84 10/02/2018   HGBA1C 5.9 10/02/2018   MICROALBUR <0.7 10/02/2018    Dg Knee Complete 4 Views Right  Result Date: 09/14/2018 CLINICAL DATA:  Right knee popped out of place while at work yesterday. Spontaneously reduced. Came out again tonight. EXAM: RIGHT KNEE - COMPLETE 4+ VIEW COMPARISON:  None. FINDINGS: Degenerative changes in the right knee with medial compartment narrowing and osteophyte formation. Patellar osteophytes. No evidence of acute fracture or dislocation. No focal bone lesion or bone destruction. Large right knee effusion. IMPRESSION: Degenerative changes in the right knee. Large right knee effusion. No acute bony abnormalities. Electronically Signed   By: Burman Nieves M.D.   On: 09/14/2018 00:51    Assessment & Plan:   Catherine Baker was seen today for medication refill and cough.  Diagnoses and all orders for this visit:  Type 2 diabetes mellitus without complication, without long-term current use of insulin (HCC) -     Hemoglobin A1c -     Microalbumin / creatinine urine ratio -     Basic metabolic panel -     Hepatic function panel  Insomnia, unspecified type -     Discontinue: zolpidem (AMBIEN) 10 MG  tablet; Take 1 tablet (10 mg total) by mouth at bedtime as needed for up to 30 days for sleep. -     zolpidem (AMBIEN) 10 MG tablet; Take 1 tablet (10 mg total) by mouth at bedtime as needed for up to 30 days for sleep. -     Hepatic function panel  Shift work sleep disorder -     Discontinue: zolpidem (AMBIEN) 10 MG tablet; Take 1 tablet (10 mg total) by mouth at bedtime as needed for up to 30 days for sleep. -     zolpidem (AMBIEN) 10 MG tablet; Take  1 tablet (10 mg total) by mouth at bedtime as needed for up to 30 days for sleep. -     Hepatic function panel  Essential hypertension -     Basic metabolic panel -     telmisartan-hydrochlorothiazide (MICARDIS HCT) 80-25 MG tablet; Take 1 tablet by mouth daily.  Encounter for hepatitis C screening test for low risk patient -     Hepatitis C Antibody  Hypothyroidism, unspecified type -     TSH -     T4, free -     T3, free  Mixed hyperlipidemia -     Lipid panel -     Hepatic function panel  Breast cancer screening by mammogram -     MM DIGITAL SCREENING BILATERAL; Future  Colon cancer screening -     Ambulatory referral to Gastroenterology  Acute nasopharyngitis -     fluticasone (FLONASE) 50 MCG/ACT nasal spray; Place 2 sprays into both nostrils daily. -     oxymetazoline (AFRIN NASAL SPRAY) 0.05 % nasal spray; Place 1 spray into both nostrils 2 (two) times daily. Use only for 3days, then stop -     benzonatate (TESSALON) 100 MG capsule; Take 1-2 capsules (100-200 mg total) by mouth 3 (three) times daily as needed for cough. -     POC Influenza A&B(BINAX/QUICKVUE)   I have discontinued Catherine Baker's omega-3 acid ethyl esters, predniSONE, traMADol, and naproxen. I have also changed her telmisartan-hydrochlorothiazide. Additionally, I am having her start on fluticasone, oxymetazoline, and benzonatate. Lastly, I am having her maintain her b complex vitamins, oxyCODONE-acetaminophen, methocarbamol, and zolpidem.  Meds ordered  this encounter  Medications  . DISCONTD: zolpidem (AMBIEN) 10 MG tablet    Sig: Take 1 tablet (10 mg total) by mouth at bedtime as needed for up to 30 days for sleep.    Dispense:  30 tablet    Refill:  5    Right Rx or [facility]Rx [facility]    Order Specific Question:   Supervising Provider    Answer:   MATTHEWS, CODY [[id number]]  . zolpidem (AMBIEN) 10 MG tablet    Sig: Take 1 tablet (10 mg total) by mouth at bedtime as needed for up to 30 days for sleep.    Dispense:  30 tablet    Refill:  5    Right Rx or US-Rx Care    Order Specific Question:   Supervising Provider    Answer:   MATTHEWS, CODY [[id number]]  . fluticasone (FLONASE) 50 MCG/ACT nasal spray    Sig: Place 2 sprays into both nostrils daily.    Dispense:  16 g    Refill:  0    Order Specific Question:   Supervising Provider    Answer:   MATTHEWS, CODY [[id number]]  . oxymetazoline (AFRIN NASAL SPRAY) 0.05 % nasal spray    Sig: Place 1 spray into both nostrils 2 (two) times daily. Use only for 3days, then stop    Dispense:  30 mL    Refill:  0    Order Specific Question:   Supervising Provider    Answer:   MATTHEWS, CODY [[id number]]  . benzonatate (TESSALON) 100 MG capsule    Sig: Take 1-2 capsules (100-200 mg total) by mouth 3 (three) times daily as needed for cough.    Dispense:  20 capsule    Refill:  0    Order Specific Question:   Supervising Provider    Answer:   MATTHEWS, CODY [[id number]]  . telmisartan-hydrochlorothiazide (MICARDIS HCT)  80-25 MG tablet    Sig: Take 1 tablet by mouth daily.    Dispense:  90 tablet    Refill:  2    Order Specific Question:   Supervising Provider    Answer:   MATTHEWS, CODY [[id number]]    Problem List Items Addressed This Visit      Cardiovascular and Mediastinum   Essential hypertension    controlled with micardis-HCT. BP Readings from Last 3 Encounters:  10/02/18 120/76  09/14/18 (!) 150/88  06/06/18 126/76   Normal BMP. F/up in 22months      Relevant Medications   telmisartan-hydrochlorothiazide  (MICARDIS HCT) 80-25 MG tablet   Other Relevant Orders   Basic metabolic panel (Completed)     Endocrine   Hypothyroidism    No medication used in last 36yrs.  Normal TSH, T3, and T4.  Continue to monitor once a year       Relevant Orders   TSH (Completed)   T4, free (Completed)   T3, free (Completed)   Type 2 diabetes mellitus (HCC) - Primary    Controlled with diet. HgbA1c of 5.9. Negative urine microalbumin. No neuropathy. LDL not at goal, advised to start pravastatin. Requested report of recent eye exam from patient.       Relevant Medications   telmisartan-hydrochlorothiazide (MICARDIS HCT) 80-25 MG tablet   Other Relevant Orders   Hemoglobin A1c (Completed)   Microalbumin / creatinine urine ratio (Completed)   Basic metabolic panel (Completed)   Hepatic function panel (Completed)     Other   Insomnia    Reports adequate sleep with ambien. Used every 1-2days. 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 She is aware about need to change medication dose (12.5mg  CR to 10mg ) due to insurance coverage.      Relevant Medications   zolpidem (AMBIEN) 10 MG tablet   Other Relevant Orders   Hepatic function panel (Completed)    Other Visit Diagnoses    Shift work sleep disorder       Relevant Medications   zolpidem (AMBIEN) 10 MG tablet   Other Relevant Orders   Hepatic function panel (Completed)   Encounter for hepatitis C screening test for low risk patient       Relevant Orders   Hepatitis C Antibody (Completed)   Mixed hyperlipidemia       Relevant Medications   telmisartan-hydrochlorothiazide (MICARDIS HCT) 80-25 MG tablet   Other Relevant Orders   Lipid panel (Completed)   Hepatic function panel (Completed)   Breast cancer screening by mammogram       Relevant Orders   MM DIGITAL SCREENING BILATERAL   Colon cancer screening       Relevant Orders   Ambulatory referral to Gastroenterology   Acute nasopharyngitis       Relevant  Medications   fluticasone (FLONASE) 50 MCG/ACT nasal spray   oxymetazoline (AFRIN NASAL SPRAY) 0.05 % nasal spray   benzonatate (TESSALON) 100 MG capsule   Other Relevant Orders   POC Influenza A&B(BINAX/QUICKVUE) (Completed)       Follow-up: Return in about 6 months (around 04/02/2019) for DM and HTN, hyperlipidemia (fasting).  Alysia Penna, NP

## 2018-10-03 ENCOUNTER — Encounter: Payer: Self-pay | Admitting: Nurse Practitioner

## 2018-10-03 ENCOUNTER — Telehealth: Payer: Self-pay | Admitting: Nurse Practitioner

## 2018-10-03 DIAGNOSIS — E782 Mixed hyperlipidemia: Secondary | ICD-10-CM | POA: Insufficient documentation

## 2018-10-03 DIAGNOSIS — E785 Hyperlipidemia, unspecified: Secondary | ICD-10-CM | POA: Insufficient documentation

## 2018-10-03 LAB — HEPATITIS C ANTIBODY
Hepatitis C Ab: NONREACTIVE
SIGNAL TO CUT-OFF: 0.01 (ref <1.00)

## 2018-10-03 MED ORDER — TELMISARTAN-HCTZ 80-25 MG PO TABS: 1.0000 | ORAL_TABLET | Freq: Every day | ORAL | 2 refills | Status: DC

## 2018-10-03 MED ORDER — PRAVASTATIN SODIUM 20 MG PO TABS: 20.0000 mg | ORAL_TABLET | Freq: Every day | ORAL | 1 refills | Status: DC

## 2018-10-03 NOTE — Assessment & Plan Note (Signed)
Controlled with diet. HgbA1c of 5.9. Negative urine microalbumin. No neuropathy. LDL not at goal, advised to start pravastatin. Requested report of recent eye exam from patient.

## 2018-10-03 NOTE — Telephone Encounter (Signed)
-----   Message from Livingston Diones, LPN sent at 6/72/8979 11:29 AM EST ----- Pt verbalize understand of test result.   Okey with trying [facility] Will fax request for eye exam.

## 2019-01-01 ENCOUNTER — Encounter: Payer: Self-pay | Admitting: Nurse Practitioner

## 2019-01-19 DIAGNOSIS — S83249A Other tear of medial meniscus, current injury, unspecified knee, initial encounter: Secondary | ICD-10-CM | POA: Insufficient documentation

## 2019-01-19 DIAGNOSIS — M1711 Unilateral primary osteoarthritis, right knee: Secondary | ICD-10-CM | POA: Insufficient documentation

## 2019-02-12 ENCOUNTER — Ambulatory Visit: Payer: PRIVATE HEALTH INSURANCE | Admitting: Nurse Practitioner

## 2019-02-12 ENCOUNTER — Telehealth: Payer: Self-pay | Admitting: Nurse Practitioner

## 2019-02-12 NOTE — Telephone Encounter (Signed)
Pt called because she came in contact with someone who had tested positive for covid and was told she should probably be tested as well, she said she hasnt had any symptoms. She said she would like a call back from [place] or [place]

## 2019-02-12 NOTE — Telephone Encounter (Signed)
Sent message to [facility] community test pool. She will be contacted to schedule appt for testing.

## 2019-02-12 NOTE — Telephone Encounter (Signed)
Pt aware someone will call her to set up appt

## 2019-02-13 ENCOUNTER — Telehealth: Payer: Self-pay

## 2019-02-13 ENCOUNTER — Ambulatory Visit: Payer: Self-pay

## 2019-02-13 NOTE — Telephone Encounter (Signed)
Patient called and says she was exposed to her family member who tested positive for covid and admitted to the hospital on Wednesday. She says she called the office yesterday to ask for testing and no one called yesterday to schedule, she was not having symptoms on yesterday. She says she woke up today with chills, fever, body aches, chest pain/burning, throat on fire/burning, slight difficulty breathing, cough and headache. She denies high risk conditions. I advised to go to the ED. I called [facility] ED and was advised to send her to [facility] ED due to chest pain. I called [facility] ED and spoke to Vicente Males, RN who took the patient's name and says to advise her to come and check in when she gets there. I advised the patient to go to [facility] ED, advised someone to drive her. She says she doesn't want to expose anyone else. Advised if she's able to drive to call [phone] for ambulance and advised them of her exposure, she verbalized understanding.   Reason for Disposition . SEVERE or constant chest pain or pressure (Exception: mild central chest pain, present only when coughing)  Answer Assessment - Initial Assessment Questions 1. CLOSE CONTACT: "Who is the person with the confirmed or suspected COVID-19 infection that you were exposed to?"     Family member tested positive for covid on Wednesday and admitted to the hospital 2. PLACE of CONTACT: "Where were you when you were exposed to COVID-19?" (e.g., home, school, medical waiting room; which city?)     [place], [place] 3. TYPE of CONTACT: "How much contact was there?" (e.g., sitting next to, live in same house, work in same office, same building)      Took her to the hospital in the car, wearing a mask 4. DURATION of CONTACT: "How long were you in contact with the COVID-19 patient?" (e.g., a few seconds, passed by person, a few minutes, live with the patient)     Overnight at my home and driving to and from the hospital 5. DATE of CONTACT: "When did you have contact with a COVID-19  patient?" (e.g., how many days ago)     Wednesday 6. TRAVEL: "Have you traveled out of the country recently?" If so, "When and where?"     * Also ask about out-of-state travel, since the CDC has identified some high-risk cities for community spread in the [place].     * Note: Travel becomes less relevant if there is widespread community transmission where the patient lives.     No 7. COMMUNITY SPREAD: "Are there lots of cases of COVID-19 (community spread) where you live?" (See public health department website, if unsure)       No 8. SYMPTOMS: "Do you have any symptoms?" (e.g., fever, cough, breathing difficulty)     Throat is on fire, headache, chills, fever, burning in the chest, cough 9. PREGNANCY OR POSTPARTUM: "Is there any chance you are pregnant?" "When was your last menstrual period?" "Did you deliver in the last 2 weeks?"     No 10. HIGH RISK: "Do you have any heart or lung problems? Do you have a weak immune system?" (e.g., CHF, COPD, asthma, HIV positive, chemotherapy, renal failure, diabetes mellitus, sickle cell anemia)       No  Answer Assessment - Initial Assessment Questions 1. COVID-19 DIAGNOSIS: "Who made your Coronavirus (COVID-19) diagnosis?" "Was it confirmed by a positive lab test?" If not diagnosed by a HCP, ask "Are there lots of cases (community spread) where you live?" (See  public health department website, if unsure)     No test done 2. ONSET: "When did the COVID-19 symptoms start?"      This morning 3. WORST SYMPTOM: "What is your worst symptom?" (e.g., cough, fever, shortness of breath, muscle aches)     Chest burning, a little shortness of breath, throat burning, fever, body aches, chills 4. COUGH: "Do you have a cough?" If so, ask: "How bad is the cough?"       Yes, not bad 5. FEVER: "Do you have a fever?" If so, ask: "What is your temperature, how was it measured, and when did it start?"     Yes, have not checked it, but have chills 6. RESPIRATORY STATUS:  "Describe your breathing?" (e.g., shortness of breath, wheezing, unable to speak)      A little hard to breathe with the chest pain 7. BETTER-SAME-WORSE: "Are you getting better, staying the same or getting worse compared to yesterday?"  If getting worse, ask, "In what way?"     Worse because no symptoms yesterday 8. HIGH RISK DISEASE: "Do you have any chronic medical problems?" (e.g., asthma, heart or lung disease, weak immune system, etc.)     No 9. PREGNANCY: "Is there any chance you are pregnant?" "When was your last menstrual period?"     N/A 10. OTHER SYMPTOMS: "Do you have any other symptoms?"  (e.g., chills, fatigue, headache, loss of smell or taste, muscle pain, sore throat)      Headache, sore throat, body aches, chills  Protocols used: CORONAVIRUS (COVID-19) DIAGNOSED OR SUSPECTED-A-AH, CORONAVIRUS (COVID-19) EXPOSURE-A-AH

## 2019-02-13 NOTE — Telephone Encounter (Addendum)
See triage encounter, patient now with symptoms, chest pain, advised to go to [facility] ED for evaluation.    ----- Message from Flossie Buffy, NP sent at 02/12/2019  2:47 PM EDT ----- Regarding: COVID test Direct contact with positive COVID case per patient. She is asymptomatic at this time She needs testing.  Insurance: [employer] # [id number] ID [id number]

## 2019-02-16 NOTE — Telephone Encounter (Signed)
Ok thank you 

## 2019-02-16 NOTE — Telephone Encounter (Signed)
Catherine Baker FYI-- 

## 2019-02-16 NOTE — Telephone Encounter (Signed)
I do not see that she want to hospital as instructed. Please reach out to her and find out how she is doing?

## 2019-02-16 NOTE — Telephone Encounter (Signed)
Pt report didn't go to the ED due to chest pain was better but she went to testing site to get COVID 19 tested (waiting for result). Pt report quarantine herself right now, some headache on right side,sore throat,weak,congestions,back pain. Pt is taking otc tylenol,nyquail.

## 2019-04-30 ENCOUNTER — Other Ambulatory Visit: Payer: Self-pay | Admitting: Nurse Practitioner

## 2019-04-30 ENCOUNTER — Other Ambulatory Visit: Payer: Self-pay

## 2019-04-30 DIAGNOSIS — G4700 Insomnia, unspecified: Secondary | ICD-10-CM

## 2019-04-30 DIAGNOSIS — G4726 Circadian rhythm sleep disorder, shift work type: Secondary | ICD-10-CM

## 2019-05-01 ENCOUNTER — Ambulatory Visit (INDEPENDENT_AMBULATORY_CARE_PROVIDER_SITE_OTHER): Payer: PRIVATE HEALTH INSURANCE | Admitting: Nurse Practitioner

## 2019-05-01 ENCOUNTER — Encounter: Payer: Self-pay | Admitting: Nurse Practitioner

## 2019-05-01 VITALS — BP 122/82 | HR 69 | Temp 98.9°F | Ht 67.0 in | Wt 257.8 lb

## 2019-05-01 DIAGNOSIS — I10 Essential (primary) hypertension: Secondary | ICD-10-CM

## 2019-05-01 DIAGNOSIS — Z780 Asymptomatic menopausal state: Secondary | ICD-10-CM

## 2019-05-01 DIAGNOSIS — G4700 Insomnia, unspecified: Secondary | ICD-10-CM

## 2019-05-01 DIAGNOSIS — E782 Mixed hyperlipidemia: Secondary | ICD-10-CM | POA: Diagnosis not present

## 2019-05-01 DIAGNOSIS — E119 Type 2 diabetes mellitus without complications: Secondary | ICD-10-CM

## 2019-05-01 DIAGNOSIS — E039 Hypothyroidism, unspecified: Secondary | ICD-10-CM

## 2019-05-01 NOTE — Patient Instructions (Signed)
Please call back with name of ophthalmology. Schedule mammogram and bone density  Go to lab for blood draw. Looks like to you still have 2refills on Ambien, please contact the pharmacy. Will send other medication refills after review of lab results.

## 2019-05-01 NOTE — Progress Notes (Signed)
Subjective:  Patient ID: Catherine Baker, female    DOB: 27-Mar-1951  Age: 68 y.o. MRN: [id number]  CC: Medication Refill (med refill/ ambien and telmisartan-HCTZ/ denies flu shot )  HPI  HTN: BP at goal with micardis HCT BP Readings from Last 3 Encounters:  05/01/19 122/82  10/02/18 120/76  09/14/18 (!) 150/88   Hypothyroidism: Weight gain due to inability to exercise in last 47months due to right knee injury. Stable mood, and no hair loss.  Insomnia: use of ambien prn Last filled 02/2019, PMP database reviewed, has 2 refills left from 09/2018 rx.  She did not schedule colonoscopy due to right knee injury and upcoming surgery. She plans to schedule mammogram and bone density.   Reviewed past Medical, Social and Family history today.  Outpatient Medications Prior to Visit  Medication Sig Dispense Refill  . b complex vitamins tablet Take 1 tablet by mouth daily.    . methocarbamol (ROBAXIN) 750 MG tablet     . traMADol (ULTRAM) 50 MG tablet tramadol 50 mg tablet  TAKE 1 TABLET BY MOUTH EVERY 6 HOURS    . telmisartan-hydrochlorothiazide (MICARDIS HCT) 80-25 MG tablet Take 1 tablet by mouth daily. 90 tablet 2  . meloxicam (MOBIC) 7.5 MG tablet meloxicam 7.5 mg tablet  TAKE 1 TABLET BY MOUTH ONCE DAILY FOR 30 DAYS    . pravastatin (PRAVACHOL) 20 MG tablet Take 1 tablet (20 mg total) by mouth daily. (Patient not taking: Reported on 05/01/2019) 90 tablet 1  . zolpidem (AMBIEN) 10 MG tablet Take 1 tablet (10 mg total) by mouth at bedtime as needed for up to 30 days for sleep. 30 tablet 5  . benzonatate (TESSALON) 100 MG capsule Take 1-2 capsules (100-200 mg total) by mouth 3 (three) times daily as needed for cough. 20 capsule 0  . fluticasone (FLONASE) 50 MCG/ACT nasal spray Place 2 sprays into both nostrils daily. 16 g 0  . oxyCODONE-acetaminophen (PERCOCET) 10-325 MG tablet Take 1 tablet by mouth every 4 (four) hours as needed for pain.    Marland Kitchen oxymetazoline (AFRIN NASAL SPRAY) 0.05 %  nasal spray Place 1 spray into both nostrils 2 (two) times daily. Use only for 3days, then stop 30 mL 0   No facility-administered medications prior to visit.     ROS See HPI  Objective:  BP 122/82   Pulse 69   Temp 98.9 F (37.2 C) (Oral)   Ht 5\' 7"  (1.702 m)   Wt 257 lb 12.8 oz (116.9 kg)   SpO2 97%   BMI 40.38 kg/m   BP Readings from Last 3 Encounters:  05/01/19 122/82  10/02/18 120/76  09/14/18 (!) 150/88    Wt Readings from Last 3 Encounters:  05/01/19 257 lb 12.8 oz (116.9 kg)  10/02/18 246 lb (111.6 kg)  09/14/18 244 lb (110.7 kg)    Physical Exam Vitals signs reviewed.  Neck:     Musculoskeletal: Normal range of motion and neck supple.  Cardiovascular:     Rate and Rhythm: Normal rate and regular rhythm.     Pulses: Normal pulses.     Heart sounds: Normal heart sounds.  Pulmonary:     Effort: Pulmonary effort is normal.     Breath sounds: Normal breath sounds.  Musculoskeletal:     Right lower leg: No edema.     Left lower leg: No edema.  Lymphadenopathy:     Cervical: No cervical adenopathy.  Skin:    General: Skin is warm and dry.  Findings: No erythema or rash.  Neurological:     Mental Status: She is alert and oriented to person, place, and time.     Comments: Normal diabetic foot exam  Psychiatric:        Mood and Affect: Mood normal.        Behavior: Behavior normal.        Thought Content: Thought content normal.    Lab Results  Component Value Date   WBC 7.3 06/06/2018   HGB 13.5 06/06/2018   HCT 40.1 06/06/2018   PLT 372.0 06/06/2018   GLUCOSE 99 05/01/2019   CHOL 234 (H) 10/02/2018   TRIG 69.0 10/02/2018   HDL 57.00 10/02/2018   LDLCALC 163 (H) 10/02/2018   ALT 13 10/02/2018   AST 12 10/02/2018   NA 137 05/01/2019   K 4.3 05/01/2019   CL 99 05/01/2019   CREATININE 0.90 05/01/2019   BUN 17 05/01/2019   CO2 27 05/01/2019   TSH 2.55 05/01/2019   HGBA1C 5.8 (H) 05/01/2019   MICROALBUR <0.7 10/02/2018    Dg Knee  Complete 4 Views Right  Result Date: 09/14/2018 CLINICAL DATA:  Right knee popped out of place while at work yesterday. Spontaneously reduced. Came out again tonight. EXAM: RIGHT KNEE - COMPLETE 4+ VIEW COMPARISON:  None. FINDINGS: Degenerative changes in the right knee with medial compartment narrowing and osteophyte formation. Patellar osteophytes. No evidence of acute fracture or dislocation. No focal bone lesion or bone destruction. Large right knee effusion. IMPRESSION: Degenerative changes in the right knee. Large right knee effusion. No acute bony abnormalities. Electronically Signed   By: Burman NievesWilliam  Stevens M.D.   On: 09/14/2018 00:51    Assessment & Plan:   Catherine Baker was seen today for medication refill.  Diagnoses and all orders for this visit:  Essential hypertension -     Basic metabolic panel -     telmisartan-hydrochlorothiazide (MICARDIS HCT) 80-25 MG tablet; Take 1 tablet by mouth daily.  Hypothyroidism, unspecified type -     TSH -     T4, free  Insomnia, unspecified type  Mixed hyperlipidemia  Type 2 diabetes mellitus without complication, without long-term current use of insulin (HCC) -     Basic metabolic panel -     Hemoglobin A1c  Asymptomatic age-related postmenopausal state -     DG Bone Density; Future   I have discontinued Catherine Baker's oxyCODONE-acetaminophen, fluticasone, oxymetazoline, and benzonatate. I am also having her maintain her b complex vitamins, methocarbamol, zolpidem, pravastatin, meloxicam, traMADol, and telmisartan-hydrochlorothiazide.  Meds ordered this encounter  Medications  . telmisartan-hydrochlorothiazide (MICARDIS HCT) 80-25 MG tablet    Sig: Take 1 tablet by mouth daily.    Dispense:  90 tablet    Refill:  1    Order Specific Question:   Supervising Provider    Answer:   Dianne DunARON, TALIA M [[id number]]    Problem List Items Addressed This Visit      Cardiovascular and Mediastinum   Essential hypertension - Primary   Relevant  Medications   telmisartan-hydrochlorothiazide (MICARDIS HCT) 80-25 MG tablet   Other Relevant Orders   Basic metabolic panel (Completed)     Endocrine   Hypothyroidism   Relevant Orders   TSH (Completed)   T4, free (Completed)   Type 2 diabetes mellitus (HCC)   Relevant Medications   telmisartan-hydrochlorothiazide (MICARDIS HCT) 80-25 MG tablet   Other Relevant Orders   Basic metabolic panel (Completed)   Hemoglobin A1c (Completed)     Other  Asymptomatic age-related postmenopausal state   Relevant Orders   DG Bone Density   Insomnia   Mixed hyperlipidemia   Relevant Medications   telmisartan-hydrochlorothiazide (MICARDIS HCT) 80-25 MG tablet      Follow-up: Return in about 6 months (around 10/29/2019) for DM and HTN, hyperlipidemia.  Catherine Lacy, NP

## 2019-05-02 LAB — BASIC METABOLIC PANEL
BUN: 17 mg/dL (ref 7–25)
CO2: 27 mmol/L (ref 20–32)
Calcium: 9.7 mg/dL (ref 8.6–10.4)
Chloride: 99 mmol/L (ref 98–110)
Creat: 0.9 mg/dL (ref 0.50–0.99)
Glucose, Bld: 99 mg/dL (ref 65–99)
Potassium: 4.3 mmol/L (ref 3.5–5.3)
Sodium: 137 mmol/L (ref 135–146)

## 2019-05-02 LAB — HEMOGLOBIN A1C
Hgb A1c MFr Bld: 5.8 % of total Hgb — ABNORMAL HIGH (ref <5.7)
Mean Plasma Glucose: 120 (calc)
eAG (mmol/L): 6.6 (calc)

## 2019-05-02 LAB — T4, FREE: Free T4: 1.2 ng/dL (ref 0.8–1.8)

## 2019-05-02 LAB — TSH: TSH: 2.55 mIU/L (ref 0.40–4.50)

## 2019-05-04 MED ORDER — TELMISARTAN-HCTZ 80-25 MG PO TABS: 1.0000 | ORAL_TABLET | Freq: Every day | ORAL | 1 refills | Status: DC

## 2019-05-05 ENCOUNTER — Other Ambulatory Visit: Payer: Self-pay | Admitting: Nurse Practitioner

## 2019-05-05 DIAGNOSIS — G4700 Insomnia, unspecified: Secondary | ICD-10-CM

## 2019-05-05 DIAGNOSIS — G4726 Circadian rhythm sleep disorder, shift work type: Secondary | ICD-10-CM

## 2019-05-05 NOTE — Telephone Encounter (Signed)
Spoke with the pharmacy and they said that rx expired 6 mo after the written date even if it has refill left or not. Please help send in Rx for the pt, she is out of this med.

## 2019-05-05 NOTE — Telephone Encounter (Signed)
Pt called back stating she is completely out of her medication and it was suppose to have refills. Pt requests call back.

## 2019-05-05 NOTE — Telephone Encounter (Signed)
Pharmacy called in stating they are needing clarification on this medication for the patient. States it has ended however patient is saying she should have a new script sent in from [facility]. Please advise.

## 2019-07-14 ENCOUNTER — Telehealth: Payer: Self-pay | Admitting: Nurse Practitioner

## 2019-07-14 NOTE — Telephone Encounter (Signed)
Pt came in and dropped off form for [facility] to fill out, I put in Charlotte's folder up front

## 2019-07-15 NOTE — Telephone Encounter (Signed)
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 Pt stated she completed the treatments with PT for her knee and it is better now, pt also mention she already got the clearance from ortho.   Pt aware to pick up form at the front desk.

## 2019-08-20 ENCOUNTER — Other Ambulatory Visit: Payer: Self-pay

## 2019-08-20 ENCOUNTER — Ambulatory Visit
Admission: RE | Admit: 2019-08-20 | Discharge: 2019-08-20 | Disposition: A | Discharge status: Home / Self Care | Payer: PRIVATE HEALTH INSURANCE | Source: Ambulatory Visit | Attending: Nurse Practitioner | Admitting: Nurse Practitioner

## 2019-08-20 DIAGNOSIS — Z780 Asymptomatic menopausal state: Secondary | ICD-10-CM

## 2019-11-19 ENCOUNTER — Other Ambulatory Visit: Payer: Self-pay | Admitting: Nurse Practitioner

## 2019-11-19 DIAGNOSIS — G4700 Insomnia, unspecified: Secondary | ICD-10-CM

## 2019-11-19 DIAGNOSIS — G4726 Circadian rhythm sleep disorder, shift work type: Secondary | ICD-10-CM

## 2019-11-19 DIAGNOSIS — I10 Essential (primary) hypertension: Secondary | ICD-10-CM

## 2019-11-19 MED ORDER — TELMISARTAN-HCTZ 80-25 MG PO TABS: 1.0000 | ORAL_TABLET | Freq: Every day | ORAL | 0 refills | Status: DC

## 2019-11-19 NOTE — Telephone Encounter (Signed)
Patient is calling to check the status of blood pressure. CB is [phone]

## 2019-11-19 NOTE — Telephone Encounter (Signed)
Please clarify message below? 

## 2019-11-19 NOTE — Telephone Encounter (Signed)
Pt request refill for Ambien and Micardis. Please advise, pt is out of these two meds and she scheduled to see you on 12/01/2019.   PMP checked last 3 pick up from [facility]  10/19/2019 09/13/2019 08/13/19

## 2019-11-30 ENCOUNTER — Other Ambulatory Visit: Payer: Self-pay

## 2019-12-01 ENCOUNTER — Ambulatory Visit (INDEPENDENT_AMBULATORY_CARE_PROVIDER_SITE_OTHER): Payer: BC Managed Care – PPO | Admitting: Nurse Practitioner

## 2019-12-01 ENCOUNTER — Other Ambulatory Visit: Payer: Self-pay | Admitting: Nurse Practitioner

## 2019-12-01 ENCOUNTER — Encounter: Payer: Self-pay | Admitting: Nurse Practitioner

## 2019-12-01 VITALS — BP 122/82 | HR 70 | Temp 97.1°F | Ht 67.0 in | Wt 252.6 lb

## 2019-12-01 DIAGNOSIS — G4700 Insomnia, unspecified: Secondary | ICD-10-CM

## 2019-12-01 DIAGNOSIS — E782 Mixed hyperlipidemia: Secondary | ICD-10-CM

## 2019-12-01 DIAGNOSIS — F4323 Adjustment disorder with mixed anxiety and depressed mood: Secondary | ICD-10-CM

## 2019-12-01 DIAGNOSIS — I10 Essential (primary) hypertension: Secondary | ICD-10-CM

## 2019-12-01 DIAGNOSIS — F4329 Adjustment disorder with other symptoms: Secondary | ICD-10-CM

## 2019-12-01 DIAGNOSIS — F330 Major depressive disorder, recurrent, mild: Secondary | ICD-10-CM

## 2019-12-01 DIAGNOSIS — E039 Hypothyroidism, unspecified: Secondary | ICD-10-CM | POA: Diagnosis not present

## 2019-12-01 DIAGNOSIS — F4321 Adjustment disorder with depressed mood: Secondary | ICD-10-CM

## 2019-12-01 DIAGNOSIS — E119 Type 2 diabetes mellitus without complications: Secondary | ICD-10-CM | POA: Diagnosis not present

## 2019-12-01 MED ORDER — ALPRAZOLAM 0.5 MG PO TABS: 0.2500 mg | ORAL_TABLET | Freq: Every day | ORAL | 0 refills | Status: DC | PRN

## 2019-12-01 NOTE — Progress Notes (Signed)
Subjective:  Patient ID: Catherine Baker, female    DOB: 19-Jul-1951  Age: 69 y.o. MRN: [id number]  CC: Follow-up (6 mo f/p-DM, HTN, hyperlipidemia-pt is fasting but had something to drink//Blood sugars good staying around 101//BP is up and down//no shots today//no eye exam )  HPI Anxiety and Depression: Increased anxiety and depression in last 57months, waxing and waning since death of mother 23-Mar-2017. Denies any SI. Will like referral to CBT Does not want to take SSRI at this time. Will like short term use of alprazolam.  HTN: BP at goal with micardis Chronic LE edema BP Readings from Last 3 Encounters:  12/01/19 122/82  05/01/19 122/82  10/02/18 120/76   Insomnia: stable with use of ambien Due to night shift work schedule. PMP database reviewed, no red flags noted.  DM: controlled with hgbA1c at 6.0 LDL not at goal Lipid Panel     Component Value Date/Time   CHOL 279 (H) 12/01/2019 1447   TRIG 96.0 12/01/2019 1447   HDL 63.90 12/01/2019 1447   CHOLHDL 4 12/01/2019 1447   VLDL 19.2 12/01/2019 1447   LDLCALC 196 (H) 12/01/2019 1447    Reviewed past Medical, Social and Family history today.  Outpatient Medications Prior to Visit  Medication Sig Dispense Refill  . b complex vitamins tablet Take 1 tablet by mouth daily.    Marland Kitchen zolpidem (AMBIEN) 10 MG tablet TAKE 1 TABLET BY MOUTH AT BEDTIME AS NEEDED 30 tablet 0  . telmisartan-hydrochlorothiazide (MICARDIS HCT) 80-25 MG tablet Take 1 tablet by mouth daily. 90 tablet 0  . meloxicam (MOBIC) 7.5 MG tablet meloxicam 7.5 mg tablet  TAKE 1 TABLET BY MOUTH ONCE DAILY FOR 30 DAYS    . methocarbamol (ROBAXIN) 750 MG tablet     . traMADol (ULTRAM) 50 MG tablet tramadol 50 mg tablet  TAKE 1 TABLET BY MOUTH EVERY 6 HOURS    . pravastatin (PRAVACHOL) 20 MG tablet Take 1 tablet (20 mg total) by mouth daily. (Patient not taking: Reported on 05/01/2019) 90 tablet 1   No facility-administered medications prior to visit.    ROS See  HPI  Objective:  BP 122/82   Pulse 70   Temp (!) 97.1 F (36.2 C) (Tympanic)   Ht 5\' 7"  (1.702 m)   Wt 252 lb 9.6 oz (114.6 kg)   SpO2 97%   BMI 39.56 kg/m   BP Readings from Last 3 Encounters:  12/01/19 122/82  05/01/19 122/82  10/02/18 120/76    Wt Readings from Last 3 Encounters:  12/01/19 252 lb 9.6 oz (114.6 kg)  05/01/19 257 lb 12.8 oz (116.9 kg)  10/02/18 246 lb (111.6 kg)    Physical Exam Constitutional:      Appearance: She is obese.  Cardiovascular:     Rate and Rhythm: Normal rate and regular rhythm.     Pulses: Normal pulses.     Heart sounds: Normal heart sounds.  Musculoskeletal:     Right lower leg: Edema present.     Left lower leg: Edema present.  Neurological:     Mental Status: She is alert and oriented to person, place, and time.  Psychiatric:        Mood and Affect: Mood is depressed.        Speech: Speech normal.        Behavior: Behavior is cooperative.        Thought Content: Thought content does not include homicidal or suicidal ideation. Thought content does not include homicidal or suicidal  plan.     Lab Results  Component Value Date   WBC 7.3 06/06/2018   HGB 13.5 06/06/2018   HCT 40.1 06/06/2018   PLT 372.0 06/06/2018   GLUCOSE 109 (H) 12/01/2019   CHOL 279 (H) 12/01/2019   TRIG 96.0 12/01/2019   HDL 63.90 12/01/2019   LDLCALC 196 (H) 12/01/2019   ALT 16 12/01/2019   AST 19 12/01/2019   NA 138 12/01/2019   K 4.0 12/01/2019   CL 100 12/01/2019   CREATININE 0.82 12/01/2019   BUN 13 12/01/2019   CO2 31 12/01/2019   TSH 2.81 12/01/2019   HGBA1C 6.0 12/01/2019   MICROALBUR <0.7 10/02/2018    Assessment & Plan:  This visit occurred during the SARS-CoV-2 public health emergency.  Safety protocols were in place, including screening questions prior to the visit, additional usage of staff PPE, and extensive cleaning of exam room while observing appropriate contact time as indicated for disinfecting solutions.   Catherine Baker was  seen today for follow-up.  Diagnoses and all orders for this visit:  Essential hypertension -     Basic metabolic panel -     telmisartan-hydrochlorothiazide (MICARDIS HCT) 80-12.5 MG tablet; Take 1 tablet by mouth daily.  Type 2 diabetes mellitus without complication, without long-term current use of insulin (HCC) -     Hemoglobin A1c  Hypothyroidism, unspecified type -     Hepatic function panel -     TSH -     T4, free  Mixed hyperlipidemia -     Hepatic function panel -     Lipid panel -     pravastatin (PRAVACHOL) 40 MG tablet; Take 1 tablet (40 mg total) by mouth daily.  Insomnia, unspecified type  Mild episode of recurrent major depressive disorder (HCC) -     ALPRAZolam (XANAX) 0.5 MG tablet; Take 0.5-1 tablets (0.25-0.5 mg total) by mouth daily as needed for anxiety. -     Ambulatory referral to Psychology  Complicated grief -     Ambulatory referral to Psychology   I have discontinued Albirta Sulewski's pravastatin and telmisartan-hydrochlorothiazide. I am also having her start on ALPRAZolam, telmisartan-hydrochlorothiazide, and pravastatin. Additionally, I am having her maintain her b complex vitamins, methocarbamol, meloxicam, traMADol, and zolpidem.  Meds ordered this encounter  Medications  . ALPRAZolam (XANAX) 0.5 MG tablet    Sig: Take 0.5-1 tablets (0.25-0.5 mg total) by mouth daily as needed for anxiety.    Dispense:  7 tablet    Refill:  0    Order Specific Question:   Supervising Provider    Answer:   Ronnald Nian [[id number]]  . telmisartan-hydrochlorothiazide (MICARDIS HCT) 80-12.5 MG tablet    Sig: Take 1 tablet by mouth daily.    Dispense:  90 tablet    Refill:  3    Order Specific Question:   Supervising Provider    Answer:   Ronnald Nian [[id number]]  . pravastatin (PRAVACHOL) 40 MG tablet    Sig: Take 1 tablet (40 mg total) by mouth daily.    Dispense:  90 tablet    Refill:  3    Order Specific Question:   Supervising Provider     Answer:   Ronnald Nian [[id number]]    Problem List Items Addressed This Visit      Cardiovascular and Mediastinum   Essential hypertension - Primary    BP at goal BP Readings from Last 3 Encounters:  12/01/19 122/82  05/01/19 122/82  10/02/18 120/76  Relevant Medications   telmisartan-hydrochlorothiazide (MICARDIS HCT) 80-12.5 MG tablet   pravastatin (PRAVACHOL) 40 MG tablet   Other Relevant Orders   Basic metabolic panel (Completed)     Endocrine   Hypothyroidism    Stable TSH and T4. No levothyroxine at this time Repeat annually      Relevant Orders   Hepatic function panel (Completed)   TSH (Completed)   T4, free (Completed)   Type 2 diabetes mellitus (HCC)    Diet controlled hgb A1c of 6.0 Repeat in 3-78months      Relevant Medications   telmisartan-hydrochlorothiazide (MICARDIS HCT) 80-12.5 MG tablet   pravastatin (PRAVACHOL) 40 MG tablet   Other Relevant Orders   Hemoglobin A1c (Completed)     Other   Adjustment disorder with mixed anxiety and depressed mood    Increased anxiety and depression in last 50months, reports increased irritability and racing thoughts, waxing and waning since death of mother 2017/03/22. Denies any SI. Will like referral to CBT Does not want to take SSRI at this time. Will like short term use of alprazolam.  Entered referral to psychologist advised not to take alprazolam and ambien with in 4hrs of each other. She is aware today's alprazolam rx will not be refilled. F/up in 60months      Relevant Medications   ALPRAZolam (XANAX) 0.5 MG tablet   Complicated grief   Relevant Orders   Ambulatory referral to Psychology   Insomnia   Mixed hyperlipidemia    LDL not at goal Start pravastatin 40mg  Lipid Panel     Component Value Date/Time   CHOL 279 (H) 12/01/2019 1447   TRIG 96.0 12/01/2019 1447   HDL 63.90 12/01/2019 1447   CHOLHDL 4 12/01/2019 1447   VLDL 19.2 12/01/2019 1447   LDLCALC 196 (H) 12/01/2019 1447         Relevant Medications   telmisartan-hydrochlorothiazide (MICARDIS HCT) 80-12.5 MG tablet   pravastatin (PRAVACHOL) 40 MG tablet   Other Relevant Orders   Hepatic function panel (Completed)   Lipid panel (Completed)       Follow-up: Return in about 3 months (around 03/01/2020) for anxiety and depression (video, 03/03/2020).  , NP

## 2019-12-01 NOTE — Patient Instructions (Addendum)
Stable liver, renal and thyroid function HgbA1c at 6.0: prediabetes Elevated LDL and TC. Start pravastatin 40mg  F/up in 18months  Do not take alprazolam and ambien with in 4hrs of each other.  You will be contacted to schedule appt with psychologist.  Maintain DASh diet Schedule appt for mammogram and with ophthalmology.

## 2019-12-02 LAB — BASIC METABOLIC PANEL
BUN: 13 mg/dL (ref 6–23)
CO2: 31 mEq/L (ref 19–32)
Calcium: 10 mg/dL (ref 8.4–10.5)
Chloride: 100 mEq/L (ref 96–112)
Creatinine, Ser: 0.82 mg/dL (ref 0.40–1.20)
GFR: 84.12 mL/min (ref >60.00)
Glucose, Bld: 109 mg/dL — ABNORMAL HIGH (ref 70–99)
Potassium: 4 mEq/L (ref 3.5–5.1)
Sodium: 138 mEq/L (ref 135–145)

## 2019-12-02 LAB — HEPATIC FUNCTION PANEL
ALT: 16 U/L (ref 0–35)
AST: 19 U/L (ref 0–37)
Albumin: 4.3 g/dL (ref 3.5–5.2)
Alkaline Phosphatase: 104 U/L (ref 39–117)
Bilirubin, Direct: 0.1 mg/dL (ref 0.0–0.3)
Total Bilirubin: 0.6 mg/dL (ref 0.2–1.2)
Total Protein: 7.2 g/dL (ref 6.0–8.3)

## 2019-12-02 LAB — LIPID PANEL
Cholesterol: 279 mg/dL — ABNORMAL HIGH (ref 0–200)
HDL: 63.9 mg/dL (ref >39.00)
LDL Cholesterol: 196 mg/dL — ABNORMAL HIGH (ref 0–99)
NonHDL: 214.85
Total CHOL/HDL Ratio: 4
Triglycerides: 96 mg/dL (ref 0.0–149.0)
VLDL: 19.2 mg/dL (ref 0.0–40.0)

## 2019-12-02 LAB — T4, FREE: Free T4: 1.12 ng/dL (ref 0.60–1.60)

## 2019-12-02 LAB — HEMOGLOBIN A1C: Hgb A1c MFr Bld: 6 % (ref 4.6–6.5)

## 2019-12-02 LAB — TSH: TSH: 2.81 u[IU]/mL (ref 0.35–4.50)

## 2019-12-03 NOTE — Telephone Encounter (Signed)
Waiting for lipid result.

## 2019-12-04 MED ORDER — PRAVASTATIN SODIUM 40 MG PO TABS: 40.0000 mg | ORAL_TABLET | Freq: Every day | ORAL | 3 refills | Status: DC

## 2019-12-04 MED ORDER — TELMISARTAN-HCTZ 80-12.5 MG PO TABS: 1.0000 | ORAL_TABLET | Freq: Every day | ORAL | 3 refills | Status: DC

## 2019-12-09 DIAGNOSIS — F4323 Adjustment disorder with mixed anxiety and depressed mood: Secondary | ICD-10-CM

## 2019-12-09 DIAGNOSIS — F4329 Adjustment disorder with other symptoms: Secondary | ICD-10-CM | POA: Insufficient documentation

## 2019-12-09 DIAGNOSIS — F4321 Adjustment disorder with depressed mood: Secondary | ICD-10-CM | POA: Insufficient documentation

## 2019-12-09 HISTORY — DX: Adjustment disorder with mixed anxiety and depressed mood: F43.23

## 2019-12-09 NOTE — Assessment & Plan Note (Signed)
LDL not at goal Start pravastatin 40mg  Lipid Panel     Component Value Date/Time   CHOL 279 (H) 12/01/2019 1447   TRIG 96.0 12/01/2019 1447   HDL 63.90 12/01/2019 1447   CHOLHDL 4 12/01/2019 1447   VLDL 19.2 12/01/2019 1447   LDLCALC 196 (H) 12/01/2019 1447

## 2019-12-09 NOTE — Assessment & Plan Note (Signed)
Stable TSH and T4. No levothyroxine at this time Repeat annually

## 2019-12-09 NOTE — Assessment & Plan Note (Signed)
BP at goal BP Readings from Last 3 Encounters:  12/01/19 122/82  05/01/19 122/82  10/02/18 120/76

## 2019-12-09 NOTE — Assessment & Plan Note (Addendum)
Increased anxiety and depression in last 16months, reports increased irritability and racing thoughts, waxing and waning since death of mother 2017-02-28. Denies any SI. Will like referral to CBT Does not want to take SSRI at this time. Will like short term use of alprazolam.  Entered referral to psychologist advised not to take alprazolam and ambien with in 4hrs of each other. She is aware today's alprazolam rx will not be refilled. F/up in 47months

## 2019-12-09 NOTE — Assessment & Plan Note (Signed)
Diet controlled hgb A1c of 6.0 Repeat in 3-90months

## 2020-01-23 ENCOUNTER — Other Ambulatory Visit: Payer: Self-pay | Admitting: Nurse Practitioner

## 2020-01-23 DIAGNOSIS — G4726 Circadian rhythm sleep disorder, shift work type: Secondary | ICD-10-CM

## 2020-01-23 DIAGNOSIS — G4700 Insomnia, unspecified: Secondary | ICD-10-CM

## 2020-01-27 ENCOUNTER — Telehealth: Payer: Self-pay

## 2020-01-27 DIAGNOSIS — G4700 Insomnia, unspecified: Secondary | ICD-10-CM

## 2020-01-27 DIAGNOSIS — G4726 Circadian rhythm sleep disorder, shift work type: Secondary | ICD-10-CM

## 2020-01-27 NOTE — Telephone Encounter (Signed)
Refill request for:  Zolpidem Last filled 11/19/19 Last OV-12/01/19 Last PMP-11/19/19

## 2020-01-28 MED ORDER — ZOLPIDEM TARTRATE 10 MG PO TABS: 10.0000 mg | ORAL_TABLET | Freq: Every evening | ORAL | 0 refills | Status: DC | PRN

## 2020-01-28 NOTE — Telephone Encounter (Signed)
rx sent

## 2020-01-28 NOTE — Telephone Encounter (Signed)
Pt was notified and verbally understood to pick up Rx.

## 2020-01-28 NOTE — Addendum Note (Signed)
Addended by: Michaela Corner on: 01/28/2020 09:14 AM   Modules accepted: Orders

## 2020-02-12 ENCOUNTER — Encounter: Payer: Self-pay | Admitting: Nurse Practitioner

## 2020-02-12 ENCOUNTER — Telehealth (INDEPENDENT_AMBULATORY_CARE_PROVIDER_SITE_OTHER): Payer: BC Managed Care – PPO | Admitting: Nurse Practitioner

## 2020-02-12 ENCOUNTER — Telehealth: Payer: Self-pay

## 2020-02-12 ENCOUNTER — Other Ambulatory Visit: Payer: Self-pay

## 2020-02-12 VITALS — Ht 67.0 in | Wt 250.0 lb

## 2020-02-12 DIAGNOSIS — R600 Localized edema: Secondary | ICD-10-CM | POA: Diagnosis not present

## 2020-02-12 DIAGNOSIS — M79662 Pain in left lower leg: Secondary | ICD-10-CM

## 2020-02-12 DIAGNOSIS — M79605 Pain in left leg: Secondary | ICD-10-CM

## 2020-02-12 MED ORDER — FUROSEMIDE 40 MG PO TABS: 40.0000 mg | ORAL_TABLET | Freq: Every day | ORAL | 0 refills | Status: DC

## 2020-02-12 NOTE — Telephone Encounter (Signed)
Catherine Baker please advise.  [facility] an Catherine Baker said she received your order but it wasn't right she said it needs to say Ultrasound  Venous doppler on Left leg. This is needed to schedule pt tomorrow morn.

## 2020-02-12 NOTE — Telephone Encounter (Signed)
Please Advise.  [facility] about pt's Vascular DVT ultrasound stating that the order needed to be changed to a regular ultrasound to be done tomorrow morning.  I'm sending to Doreene Burke, the doc of the day due to [place] being gone for the day.

## 2020-02-12 NOTE — Assessment & Plan Note (Signed)
Chronic, worsening with prolong standing. Some improvement with elevation Acute left calf pain  Use thigh high compression stocking Start furosemide x 3days Start aspirin 81mg  daily till DVT is ruled out You will be contacted to schedule appt for venous doppler Go to urgent care clinic if symptoms worsen.

## 2020-02-12 NOTE — Addendum Note (Signed)
Addended by: Andrez Grime on: 02/12/2020 03:58 PM   Modules accepted: Orders

## 2020-02-12 NOTE — Progress Notes (Signed)
Virtual Visit via Video Note  I connected with@ on 02/12/20 at  1:30 PM EDT by a video enabled telemedicine application and verified that I am speaking with the correct person using two identifiers.  Location: Patient:Home Provider: Office Participants: patient and provider  I discussed the limitations of evaluation and management by telemedicine and the availability of in person appointments. I also discussed with the patient that there may be a patient responsible charge related to this service. The patient expressed understanding and agreed to proceed.  CC:pt stated started last Friday in both of her feet an ankles//pt stated she was on her feet alot for work last night but she has a sore spot on her left calf an just wants to make sure nothing to worry about-pt requesting a call to [phone]-states she has no means to do video  History of Present Illness: Ms. Ingman present with worsening LE edema and new onset of left calf pain.triggered by prolonged standing at work. She denies any injury or prolong immobilization of legs. Denies any respiratory symptoms.  Observations/Objective: Physical Exam Constitutional:      General: She is not in acute distress. Pulmonary:     Effort: Pulmonary effort is normal.  Musculoskeletal:        General: Tenderness present.     Right lower leg: Edema present.     Left lower leg: Edema present.  Skin:    Findings: No erythema or rash.  Neurological:     Mental Status: She is alert and oriented to person, place, and time.    Assessment and Plan: Kordelia was seen today for edema.  Diagnoses and all orders for this visit:  Acute leg pain, left -     VAS [place] LOWER EXTREMITY VENOUS (DVT); Future  Bilateral leg edema -     VAS [place] LOWER EXTREMITY VENOUS (DVT); Future -     furosemide (LASIX) 40 MG tablet; Take 1 tablet (40 mg total) by mouth daily.   Follow Up Instructions: Use thigh high compression stocking Start furosemide x  3days Start aspirin 81mg  daily till DVT is ruled out You will be contacted to schedule appt for venous doppler Go to urgent care clinic if symptoms worsen.    I discussed the assessment and treatment plan with the patient. The patient was provided an opportunity to ask questions and all were answered. The patient agreed with the plan and demonstrated an understanding of the instructions.   The patient was advised to call back or seek an in-person evaluation if the symptoms worsen or if the condition fails to improve as anticipated.  , NP

## 2020-02-12 NOTE — Addendum Note (Signed)
Addended by: Andrez Grime on: 02/12/2020 04:49 PM   Modules accepted: Orders

## 2020-02-12 NOTE — Patient Instructions (Signed)
Use thigh high compression stocking Start furosemide x 3days Start aspirin 81mg  daily till DVT is ruled out You will be contacted to schedule appt for venous doppler Go to urgent care clinic if symptoms worsen.

## 2020-02-14 NOTE — Telephone Encounter (Signed)
Thank you for correcting this in my absence

## 2020-02-15 NOTE — Telephone Encounter (Signed)
Sure, [place].

## 2020-02-16 ENCOUNTER — Telehealth: Payer: Self-pay

## 2020-02-16 NOTE — Telephone Encounter (Signed)
Are these appts for venous doppler? If so, she only needs one.

## 2020-02-16 NOTE — Telephone Encounter (Signed)
Charlotte please advise.  [facility]ascular place called stating patient has 2 appointments scheduled and ask if they should cancel the one from our office.

## 2020-02-16 NOTE — Telephone Encounter (Signed)
Kristie please advise.  Pt for some reason is scheduled twice with vascular for a venous doppler. Claris Gower said she just needed one I don't have the contact number to give the ok to cancel one cause they called front desk to see if it was ok to cancel one.

## 2020-02-17 NOTE — Telephone Encounter (Signed)
Pt is scheduled 7/19 3:00pm. They double scheduled in error.   [facility] VASCULAR LABORATORY Department Contact Information  Phone Fax Address  [phone] [address]  [phone] [facility]  [place] [address]

## 2020-02-29 ENCOUNTER — Ambulatory Visit (HOSPITAL_COMMUNITY): Payer: BC Managed Care – PPO

## 2020-03-02 ENCOUNTER — Encounter (HOSPITAL_COMMUNITY): Payer: BC Managed Care – PPO

## 2020-03-04 ENCOUNTER — Ambulatory Visit (HOSPITAL_COMMUNITY)
Admission: RE | Admit: 2020-03-04 | Discharge: 2020-03-04 | Disposition: A | Discharge status: Home / Self Care | Payer: BC Managed Care – PPO | Source: Ambulatory Visit | Attending: Family Medicine | Admitting: Family Medicine

## 2020-03-04 ENCOUNTER — Other Ambulatory Visit: Payer: Self-pay

## 2020-03-04 DIAGNOSIS — M79662 Pain in left lower leg: Secondary | ICD-10-CM

## 2020-03-04 NOTE — Progress Notes (Signed)
Left lower extremity venous duplex completed. Refer to "CV Proc" under chart review to view preliminary results.  03/04/2020 3:15 PM Eula Fried., MHA, RVT, RDCS, RDMS

## 2020-03-19 ENCOUNTER — Other Ambulatory Visit: Payer: Self-pay | Admitting: Nurse Practitioner

## 2020-03-19 DIAGNOSIS — G4726 Circadian rhythm sleep disorder, shift work type: Secondary | ICD-10-CM

## 2020-03-19 DIAGNOSIS — G4700 Insomnia, unspecified: Secondary | ICD-10-CM

## 2020-03-21 ENCOUNTER — Telehealth: Payer: Self-pay | Admitting: Nurse Practitioner

## 2020-03-21 ENCOUNTER — Ambulatory Visit: Payer: BC Managed Care – PPO | Admitting: Nurse Practitioner

## 2020-03-21 NOTE — Telephone Encounter (Signed)
Called patient to reschedule her appointment on 03/25/2020 @ 10:45 (she was given this date/time, but it is not available). If patient calls back, please schedule her in an open slot the same day.  Thank you

## 2020-03-21 NOTE — Telephone Encounter (Signed)
Last ov 12/01/19 Last fill 01/28/20 #30/0

## 2020-03-22 ENCOUNTER — Ambulatory Visit: Payer: BC Managed Care – PPO | Admitting: Nurse Practitioner

## 2020-03-23 ENCOUNTER — Other Ambulatory Visit: Payer: Self-pay

## 2020-03-24 ENCOUNTER — Ambulatory Visit (INDEPENDENT_AMBULATORY_CARE_PROVIDER_SITE_OTHER): Payer: BC Managed Care – PPO | Admitting: Nurse Practitioner

## 2020-03-24 ENCOUNTER — Encounter: Payer: Self-pay | Admitting: Nurse Practitioner

## 2020-03-24 VITALS — BP 120/82 | HR 69 | Temp 98.4°F | Ht 67.0 in | Wt 258.2 lb

## 2020-03-24 DIAGNOSIS — I10 Essential (primary) hypertension: Secondary | ICD-10-CM | POA: Diagnosis not present

## 2020-03-24 DIAGNOSIS — R600 Localized edema: Secondary | ICD-10-CM

## 2020-03-24 DIAGNOSIS — R011 Cardiac murmur, unspecified: Secondary | ICD-10-CM | POA: Diagnosis not present

## 2020-03-24 LAB — BASIC METABOLIC PANEL
BUN: 16 mg/dL (ref 6–23)
CO2: 31 mEq/L (ref 19–32)
Calcium: 9.6 mg/dL (ref 8.4–10.5)
Chloride: 101 mEq/L (ref 96–112)
Creatinine, Ser: 0.9 mg/dL (ref 0.40–1.20)
GFR: 75.48 mL/min (ref >60.00)
Glucose, Bld: 95 mg/dL (ref 70–99)
Potassium: 3.7 mEq/L (ref 3.5–5.1)
Sodium: 138 mEq/L (ref 135–145)

## 2020-03-24 LAB — BRAIN NATRIURETIC PEPTIDE: Pro B Natriuretic peptide (BNP): 43 pg/mL (ref 0.0–100.0)

## 2020-03-24 MED ORDER — TELMISARTAN 80 MG PO TABS: 80.0000 mg | ORAL_TABLET | Freq: Every day | ORAL | 3 refills | Status: DC

## 2020-03-24 MED ORDER — FUROSEMIDE 40 MG PO TABS: 20.0000 mg | ORAL_TABLET | Freq: Every day | ORAL | 0 refills | Status: DC

## 2020-03-24 NOTE — Assessment & Plan Note (Addendum)
No improvement 8lbs weight gain noted today Reports intermittent SOB with exertion No CP or dizziness or nausea or cough or palpitations or PND BP at goal today Negative venous doppler Wt Readings from Last 3 Encounters:  03/24/20 258 lb 3.2 oz (117.1 kg)  02/12/20 250 lb (113.4 kg)  12/01/19 252 lb 9.6 oz (114.6 kg)   advised about the importance of DASH diet Check BNP and BMP Order echocardiogram Start furosemide daily Change telmisartan/hctz to telmisartan F/up in 16month

## 2020-03-24 NOTE — Progress Notes (Signed)
Subjective:  Patient ID: Catherine Baker, female    DOB: 06-10-1951  Age: 69 y.o. MRN: [id number]  CC: Edema (edema bilateral leg and feet pain and swelling. ) and Back Pain  Bilateral leg edema No improvement 8lbs weight gain noted today Reports intermittent SOB with exertion No CP or dizziness or nausea or cough or palpitations or PND BP at goal today Negative venous doppler Wt Readings from Last 3 Encounters:  03/24/20 258 lb 3.2 oz (117.1 kg)  02/12/20 250 lb (113.4 kg)  12/01/19 252 lb 9.6 oz (114.6 kg)   advised about the importance of DASH diet Check BNP and BMP Order echocardiogram Start furosemide daily Change telmisartan/hctz to telmisartan F/up in 71month  Wt Readings from Last 3 Encounters:  03/24/20 258 lb 3.2 oz (117.1 kg)  02/12/20 250 lb (113.4 kg)  12/01/19 252 lb 9.6 oz (114.6 kg)   Reviewed past Medical, Social and Family history today.  Outpatient Medications Prior to Visit  Medication Sig Dispense Refill  . b complex vitamins tablet Take 1 tablet by mouth daily.    . pravastatin (PRAVACHOL) 40 MG tablet Take 1 tablet (40 mg total) by mouth daily. 90 tablet 3  . zolpidem (AMBIEN) 10 MG tablet TAKE 1 TABLET BY MOUTH AT BEDTIME AS NEEDED 30 tablet 0  . furosemide (LASIX) 40 MG tablet Take 1 tablet (40 mg total) by mouth daily. 3 tablet 0  . telmisartan-hydrochlorothiazide (MICARDIS HCT) 80-12.5 MG tablet Take 1 tablet by mouth daily. 90 tablet 3   No facility-administered medications prior to visit.    ROS See HPI  Objective:  BP 120/82   Pulse 69   Temp 98.4 F (36.9 C) (Other (Comment))   Ht 5\' 7"  (1.702 m)   Wt 258 lb 3.2 oz (117.1 kg)   SpO2 96%   BMI 40.44 kg/m   Physical Exam Constitutional:      General: She is not in acute distress.    Appearance: She is obese.  Cardiovascular:     Rate and Rhythm: Normal rate and regular rhythm.     Pulses: Normal pulses.     Heart sounds: Murmur heard.   Pulmonary:     Effort: Pulmonary  effort is normal.     Breath sounds: Normal breath sounds.  Abdominal:     Palpations: Abdomen is soft.  Musculoskeletal:     Right lower leg: Edema present.     Left lower leg: Edema present.  Neurological:     Mental Status: She is alert and oriented to person, place, and time.    Assessment & Plan:  This visit occurred during the SARS-CoV-2 public health emergency.  Safety protocols were in place, including screening questions prior to the visit, additional usage of staff PPE, and extensive cleaning of exam room while observing appropriate contact time as indicated for disinfecting solutions.   Bellina was seen today for edema and back pain.  Diagnoses and all orders for this visit:  Bilateral leg edema -     Basic metabolic panel -     B Nat Peptide -     Cancel: ECHOCARDIOGRAM COMPLETE; Future -     furosemide (LASIX) 40 MG tablet; Take 0.5 tablets (20 mg total) by mouth daily. -     ECHOCARDIOGRAM COMPLETE; Future  Essential hypertension -     Basic metabolic panel -     B Nat Peptide -     Cancel: ECHOCARDIOGRAM COMPLETE; Future -     telmisartan (MICARDIS)  80 MG tablet; Take 1 tablet (80 mg total) by mouth daily. -     ECHOCARDIOGRAM COMPLETE; Future  Murmur, cardiac -     Basic metabolic panel -     B Nat Peptide -     Cancel: ECHOCARDIOGRAM COMPLETE; Future -     ECHOCARDIOGRAM COMPLETE; Future    Problem List Items Addressed This Visit      Cardiovascular and Mediastinum   Essential hypertension   Relevant Medications   telmisartan (MICARDIS) 80 MG tablet   furosemide (LASIX) 40 MG tablet   Other Relevant Orders   Basic metabolic panel   B Nat Peptide   ECHOCARDIOGRAM COMPLETE     Other   Bilateral leg edema - Primary    No improvement 8lbs weight gain noted today Reports intermittent SOB with exertion No CP or dizziness or nausea or cough or palpitations or PND BP at goal today Negative venous doppler Wt Readings from Last 3 Encounters:    03/24/20 258 lb 3.2 oz (117.1 kg)  02/12/20 250 lb (113.4 kg)  12/01/19 252 lb 9.6 oz (114.6 kg)   advised about the importance of DASH diet Check BNP and BMP Order echocardiogram Start furosemide daily Change telmisartan/hctz to telmisartan F/up in 30month      Relevant Medications   furosemide (LASIX) 40 MG tablet   Other Relevant Orders   Basic metabolic panel   B Nat Peptide   ECHOCARDIOGRAM COMPLETE    Other Visit Diagnoses    Murmur, cardiac       Relevant Orders   Basic metabolic panel   B Nat Peptide   ECHOCARDIOGRAM COMPLETE      Follow-up: Return in about 4 weeks (around 04/21/2020) for HTN and edema.  Alysia Penna, NP

## 2020-03-24 NOTE — Patient Instructions (Signed)
Start telmisartan and furosemide D/c telmisartan/HCTZ  Go to lab for blood draw You will be contacted to schedule appt for echocardiogram.  Maintain low sodium diet.  DASH Eating Plan DASH stands for "Dietary Approaches to Stop Hypertension." The DASH eating plan is a healthy eating plan that has been shown to reduce high blood pressure (hypertension). It may also reduce your risk for type 2 diabetes, heart disease, and stroke. The DASH eating plan may also help with weight loss. What are tips for following this plan?  General guidelines  Avoid eating more than 2,300 mg (milligrams) of salt (sodium) a day. If you have hypertension, you may need to reduce your sodium intake to 1,500 mg a day.  Limit alcohol intake to no more than 1 drink a day for nonpregnant women and 2 drinks a day for men. One drink equals 12 oz of beer, 5 oz of wine, or 1 oz of hard liquor.  Work with your health care provider to maintain a healthy body weight or to lose weight. Ask what an ideal weight is for you.  Get at least 30 minutes of exercise that causes your heart to beat faster (aerobic exercise) most days of the week. Activities may include walking, swimming, or biking.  Work with your health care provider or diet and nutrition specialist (dietitian) to adjust your eating plan to your individual calorie needs. Reading food labels   Check food labels for the amount of sodium per serving. Choose foods with less than 5 percent of the Daily Value of sodium. Generally, foods with less than 300 mg of sodium per serving fit into this eating plan.  To find whole grains, look for the word "whole" as the first word in the ingredient list. Shopping  Buy products labeled as "low-sodium" or "no salt added."  Buy fresh foods. Avoid canned foods and premade or frozen meals. Cooking  Avoid adding salt when cooking. Use salt-free seasonings or herbs instead of table salt or sea salt. Check with your health care  provider or pharmacist before using salt substitutes.  Do not fry foods. Cook foods using healthy methods such as baking, boiling, grilling, and broiling instead.  Cook with heart-healthy oils, such as olive, canola, soybean, or sunflower oil. Meal planning  Eat a balanced diet that includes: ? 5 or more servings of fruits and vegetables each day. At each meal, try to fill half of your plate with fruits and vegetables. ? Up to 6-8 servings of whole grains each day. ? Less than 6 oz of lean meat, poultry, or fish each day. A 3-oz serving of meat is about the same size as a deck of cards. One egg equals 1 oz. ? 2 servings of low-fat dairy each day. ? A serving of nuts, seeds, or beans 5 times each week. ? Heart-healthy fats. Healthy fats called Omega-3 fatty acids are found in foods such as flaxseeds and coldwater fish, like sardines, salmon, and mackerel.  Limit how much you eat of the following: ? Canned or prepackaged foods. ? Food that is high in trans fat, such as fried foods. ? Food that is high in saturated fat, such as fatty meat. ? Sweets, desserts, sugary drinks, and other foods with added sugar. ? Full-fat dairy products.  Do not salt foods before eating.  Try to eat at least 2 vegetarian meals each week.  Eat more home-cooked food and less restaurant, buffet, and fast food.  When eating at a restaurant, ask that your food  be prepared with less salt or no salt, if possible. What foods are recommended? The items listed may not be a complete list. Talk with your dietitian about what dietary choices are best for you. Grains Whole-grain or whole-wheat bread. Whole-grain or whole-wheat pasta. Brown rice. [place]. Bulgur. Whole-grain and low-sodium cereals. Pita bread. Low-fat, low-sodium crackers. Whole-wheat flour tortillas. Vegetables Fresh or frozen vegetables (raw, steamed, roasted, or grilled). Low-sodium or reduced-sodium tomato and vegetable juice. Low-sodium or  reduced-sodium tomato sauce and tomato paste. Low-sodium or reduced-sodium canned vegetables. Fruits All fresh, dried, or frozen fruit. Canned fruit in natural juice (without added sugar). Meat and other protein foods Skinless chicken or [place]. Ground chicken or [place]. Pork with fat trimmed off. Fish and seafood. Egg whites. Dried beans, peas, or lentils. Unsalted nuts, nut butters, and seeds. Unsalted canned beans. Lean cuts of beef with fat trimmed off. Low-sodium, lean deli meat. Dairy Low-fat (1%) or fat-free (skim) milk. Fat-free, low-fat, or reduced-fat cheeses. Nonfat, low-sodium ricotta or cottage cheese. Low-fat or nonfat yogurt. Low-fat, low-sodium cheese. Fats and oils Soft margarine without trans fats. Vegetable oil. Low-fat, reduced-fat, or light mayonnaise and salad dressings (reduced-sodium). Canola, safflower, olive, soybean, and sunflower oils. Avocado. Seasoning and other foods Herbs. Spices. Seasoning mixes without salt. Unsalted popcorn and pretzels. Fat-free sweets. What foods are not recommended? The items listed may not be a complete list. Talk with your dietitian about what dietary choices are best for you. Grains Baked goods made with fat, such as croissants, muffins, or some breads. Dry pasta or rice meal packs. Vegetables Creamed or fried vegetables. Vegetables in a cheese sauce. Regular canned vegetables (not low-sodium or reduced-sodium). Regular canned tomato sauce and paste (not low-sodium or reduced-sodium). Regular tomato and vegetable juice (not low-sodium or reduced-sodium). Angie Fava. Olives. Fruits Canned fruit in a light or heavy syrup. Fried fruit. Fruit in cream or butter sauce. Meat and other protein foods Fatty cuts of meat. Ribs. Fried meat. Berniece Salines. Sausage. Bologna and other processed lunch meats. Salami. Fatback. Hotdogs. Bratwurst. Salted nuts and seeds. Canned beans with added salt. Canned or smoked fish. Whole eggs or egg yolks. Chicken or [place]  with skin. Dairy Whole or 2% milk, cream, and half-and-half. Whole or full-fat cream cheese. Whole-fat or sweetened yogurt. Full-fat cheese. Nondairy creamers. Whipped toppings. Processed cheese and cheese spreads. Fats and oils Butter. Stick margarine. Lard. Shortening. Ghee. Bacon fat. Tropical oils, such as coconut, palm kernel, or palm oil. Seasoning and other foods Salted popcorn and pretzels. Onion salt, garlic salt, seasoned salt, table salt, and sea salt. Worcestershire sauce. Tartar sauce. Barbecue sauce. Teriyaki sauce. Soy sauce, including reduced-sodium. Steak sauce. Canned and packaged gravies. Fish sauce. Oyster sauce. Cocktail sauce. Horseradish that you find on the shelf. Ketchup. Mustard. Meat flavorings and tenderizers. Bouillon cubes. Hot sauce and Tabasco sauce. Premade or packaged marinades. Premade or packaged taco seasonings. Relishes. Regular salad dressings. Where to find more information:  National Heart, Lung, and [place]: https://[place]-eaton.com/  American Heart Association: www.heart.org Summary  The DASH eating plan is a healthy eating plan that has been shown to reduce high blood pressure (hypertension). It may also reduce your risk for type 2 diabetes, heart disease, and stroke.  With the DASH eating plan, you should limit salt (sodium) intake to 2,300 mg a day. If you have hypertension, you may need to reduce your sodium intake to 1,500 mg a day.  When on the DASH eating plan, aim to eat more fresh fruits and vegetables, whole  grains, lean proteins, low-fat dairy, and heart-healthy fats.  Work with your health care provider or diet and nutrition specialist (dietitian) to adjust your eating plan to your individual calorie needs. This information is not intended to replace advice given to you by your health care provider. Make sure you discuss any questions you have with your health care provider. Document Revised: 07/12/2017 Document Reviewed:  07/23/2016 Elsevier Patient Education  2020 [employer].

## 2020-03-30 ENCOUNTER — Telehealth: Payer: Self-pay | Admitting: Nurse Practitioner

## 2020-03-30 DIAGNOSIS — M1711 Unilateral primary osteoarthritis, right knee: Secondary | ICD-10-CM

## 2020-03-30 MED ORDER — IBUPROFEN 600 MG PO TABS: 600.0000 mg | ORAL_TABLET | Freq: Three times a day (TID) | ORAL | 0 refills | Status: DC | PRN

## 2020-03-30 NOTE — Telephone Encounter (Signed)
-----   Message from Rene Paci, [place] sent at 03/29/2020  8:53 AM EDT ----- Catherine Baker pt asking if she could have an Rx for Ibuprofen 800mg  for the pain in her knees an back.

## 2020-03-30 NOTE — Telephone Encounter (Signed)
Ibuprofen sent

## 2020-04-07 ENCOUNTER — Telehealth (HOSPITAL_COMMUNITY): Payer: Self-pay | Admitting: Nurse Practitioner

## 2020-04-07 NOTE — Telephone Encounter (Signed)
Patient cancelled due to death in family and will call back to reschedule. Order will be cancelled and if pt calls back we can reinstate order.

## 2020-04-12 ENCOUNTER — Other Ambulatory Visit (HOSPITAL_COMMUNITY): Payer: BC Managed Care – PPO

## 2020-04-20 ENCOUNTER — Encounter: Payer: Self-pay | Admitting: Nurse Practitioner

## 2020-04-20 ENCOUNTER — Ambulatory Visit (INDEPENDENT_AMBULATORY_CARE_PROVIDER_SITE_OTHER): Payer: BC Managed Care – PPO | Admitting: Nurse Practitioner

## 2020-04-20 ENCOUNTER — Other Ambulatory Visit: Payer: Self-pay

## 2020-04-20 VITALS — BP 136/88 | HR 68 | Temp 97.3°F | Ht 67.0 in | Wt 254.6 lb

## 2020-04-20 DIAGNOSIS — Z7189 Other specified counseling: Secondary | ICD-10-CM

## 2020-04-20 NOTE — Patient Instructions (Signed)
At [facility], we are fully committed to providing effective and safe vaccines to eliminate COVID-19 in our communities.  Visit AdvisorRank.co.uk for the most up-to-date vaccine updates.   - Vaccines Are Available for Ages 41 and Above   - [facility] has vaccine clinic locations in [place], [place] and [place] counties for anyone 12 and over, with appointment    scheduling information in the link below:   - [email or web address].lee.net/  For assistance in finding a vaccine clinic near you, please call [phone].  Find all vaccine locations throughout the state of [place] at [email or web address]  Additional Vaccine?  What you Need to Know   - CDC recommends that people with moderately to severely compromised immune systems receive an additional dose of mRNA COVID-19 vaccine at least 28 days after a second dose of Pfizer-BioNTech COVID-19 vaccine or Moderna COVID-19 vaccine.   - This additional dose intended to improve immunocompromised peoples response to their initial vaccine series is not the same as a booster dose, given to people when the immune response to a primary vaccine series is likely to have waned over time.  Who Needs an Additional COVID-19 Vaccine? Currently, CDC is recommending that moderately to severely immunocompromised people receive an additional dose. This includes people who have:  - Been receiving active cancer treatment for tumors or cancers of the blood  - Received an organ transplant and are taking medicine to suppress the immune system  - Received a stem cell transplant within the last 2 years or are taking medicine to suppress the immune system  - Moderate or severe primary immunodeficiency (such as DiGeorge syndrome, Wiskott-Aldrich syndrome)  - Advanced or untreated HIV infection  - Active treatment with high-dose corticosteroids or other drugs that may suppress  your immune response  People should talk to their healthcare provider about their medical condition, and whether getting an additional dose is appropriate for them  CDC does not recommend additional doses or booster shots for any other population at this time.  Following the FDA's authorization and CDC recommendation that those with moderately to severely compromised immune systems receive a third dose of the COVID-19 vaccine, [facility] plans to provide the additional vaccines at current vaccine clinics.  COVID-19 Vaccine Information can be found at: PodExchange.nl For questions related to vaccine distribution or appointments, please email [email or web address] or call [phone].

## 2020-04-20 NOTE — Progress Notes (Signed)
° °  Subjective:  Patient ID: Catherine Baker, female    DOB: 08-Feb-1951  Age: 69 y.o. MRN: [id number]  CC: Follow-up (Pt would like to discuss COVID vaccine in regards to her job. )  HPI CoVID Vaccine education: Wants to know if vaccine causes COVID infection, or if she has a medical reason not to take vaccine.  Reviewed past Medical, Social and Family history today.  Outpatient Medications Prior to Visit  Medication Sig Dispense Refill   b complex vitamins tablet Take 1 tablet by mouth daily.     furosemide (LASIX) 40 MG tablet Take 0.5 tablets (20 mg total) by mouth daily. 90 tablet 0   ibuprofen (ADVIL) 600 MG tablet Take 1 tablet (600 mg total) by mouth every 8 (eight) hours as needed (with food). 30 tablet 0   pravastatin (PRAVACHOL) 40 MG tablet Take 1 tablet (40 mg total) by mouth daily. 90 tablet 3   telmisartan (MICARDIS) 80 MG tablet Take 1 tablet (80 mg total) by mouth daily. 90 tablet 3   zolpidem (AMBIEN) 10 MG tablet TAKE 1 TABLET BY MOUTH AT BEDTIME AS NEEDED 30 tablet 0   No facility-administered medications prior to visit.    ROS See HPI  Objective:  BP 136/88 (BP Location: Left Arm, Patient Position: Sitting, Cuff Size: Normal)    Pulse 68    Temp (!) 97.3 F (36.3 C) (Temporal)    Ht 5\' 7"  (1.702 m)    Wt 254 lb 9.6 oz (115.5 kg)    SpO2 99%    BMI 39.88 kg/m   Physical Exam Neurological:     Mental Status: She is alert.  Psychiatric:        Mood and Affect: Mood is anxious.        Speech: Speech normal.    Assessment & Plan:  This visit occurred during the SARS-CoV-2 public health emergency.  Safety protocols were in place, including screening questions prior to the visit, additional usage of staff PPE, and extensive cleaning of exam room while observing appropriate contact time as indicated for disinfecting solutions.   Zoeann was seen today for follow-up.  Diagnoses and all orders for this visit:  Educated about COVID-19 virus  infection  Advised Ms. Schaab about the criteria for medical exeption from COVID vaccine. She was informed that she does not meet the criteria, hence encouraged to take the vaccine. We also reviewed possible side effects and how to managed them.  Problem List Items Addressed This Visit    None    Visit Diagnoses    Educated about COVID-19 virus infection    -  Primary      I have spent Meriam Sprague with this patient regarding history taking, documentation, review of different vaccines and possible side efects, formulating plan and discussing treatment options with patient.  Follow-up: No follow-ups on file.  , NP

## 2020-04-26 ENCOUNTER — Other Ambulatory Visit: Payer: Self-pay | Admitting: Nurse Practitioner

## 2020-04-26 DIAGNOSIS — G4726 Circadian rhythm sleep disorder, shift work type: Secondary | ICD-10-CM

## 2020-04-26 DIAGNOSIS — G4700 Insomnia, unspecified: Secondary | ICD-10-CM

## 2020-05-30 ENCOUNTER — Other Ambulatory Visit: Payer: Self-pay | Admitting: Nurse Practitioner

## 2020-05-30 ENCOUNTER — Telehealth: Payer: Self-pay | Admitting: Nurse Practitioner

## 2020-05-30 DIAGNOSIS — M1711 Unilateral primary osteoarthritis, right knee: Secondary | ICD-10-CM

## 2020-05-30 NOTE — Telephone Encounter (Signed)
Patient is calling and wanted to get a refill for ibuprofen and wanted to see if Claris Gower can up the dosage to 800 mg, please advise. CB is [phone]

## 2020-05-31 NOTE — Telephone Encounter (Signed)
Last OV 03/24/20 Last fill 03/30/20  #30/0

## 2020-05-31 NOTE — Telephone Encounter (Signed)
Patient notified and verbalized understanding. 

## 2020-05-31 NOTE — Telephone Encounter (Signed)
I do not recommend an increase in dose due to risk of renal injury.

## 2020-06-23 ENCOUNTER — Other Ambulatory Visit: Payer: Self-pay | Admitting: Nurse Practitioner

## 2020-06-23 DIAGNOSIS — I10 Essential (primary) hypertension: Secondary | ICD-10-CM

## 2020-06-23 DIAGNOSIS — G4726 Circadian rhythm sleep disorder, shift work type: Secondary | ICD-10-CM

## 2020-06-23 DIAGNOSIS — R600 Localized edema: Secondary | ICD-10-CM

## 2020-06-23 DIAGNOSIS — G4700 Insomnia, unspecified: Secondary | ICD-10-CM

## 2020-06-24 NOTE — Telephone Encounter (Signed)
I spoke with pt and she would like to speak to Nche in regards to her medication.  Pt said that she is not willing to do the sleep study but she is willing to do the echo.  Pt explained that she is claustrophobic, and that she didn't want to stop medication cold [place] because it helps her sleep.  Pt would like a call back to discuss rx, pt would like enough to cover her until her virtual visit w/Nche on the 23rd of this month.  Pt said that she would like a referral to get the echo performed.  Please advise.  CB# [phone]

## 2020-06-24 NOTE — Telephone Encounter (Signed)
Patient states that she found out that you could do the sleep study at home. She would prefer that (if possible). Please call her back and advise.

## 2020-06-24 NOTE — Telephone Encounter (Signed)
Last OV 04/20/20 Last fill 04/26/20  #30/0

## 2020-06-24 NOTE — Telephone Encounter (Signed)
She was instructed to complete sleep study and echocardiagram in past. Please advise, I will not be able to continue refilling this medication if these are not completed. Thank you

## 2020-06-27 NOTE — Telephone Encounter (Signed)
Method of sleep study is determined after her appt with neurologist at sleep clinic. Referral entered She already has an order for echocardiogram. Provide number to call and schedule. Sent 9tabs on 5mg , I will not provided additional with the above diagnostics.

## 2020-07-05 ENCOUNTER — Encounter: Payer: BC Managed Care – PPO | Admitting: Nurse Practitioner

## 2020-07-06 NOTE — Progress Notes (Signed)
Patient decided to cancel appt, stating she is at work. This encounter was created in error - please disregard.

## 2020-07-26 ENCOUNTER — Ambulatory Visit (HOSPITAL_COMMUNITY): Payer: BC Managed Care – PPO | Attending: Cardiology

## 2020-07-26 ENCOUNTER — Other Ambulatory Visit: Payer: Self-pay

## 2020-07-26 DIAGNOSIS — R600 Localized edema: Secondary | ICD-10-CM

## 2020-07-26 DIAGNOSIS — I10 Essential (primary) hypertension: Secondary | ICD-10-CM | POA: Diagnosis not present

## 2020-07-26 DIAGNOSIS — R011 Cardiac murmur, unspecified: Secondary | ICD-10-CM | POA: Diagnosis not present

## 2020-07-26 LAB — ECHOCARDIOGRAM COMPLETE
Area-P 1/2: 3.91 cm2
S' Lateral: 3 cm

## 2020-07-27 ENCOUNTER — Encounter: Payer: Self-pay | Admitting: Neurology

## 2020-07-27 ENCOUNTER — Ambulatory Visit (INDEPENDENT_AMBULATORY_CARE_PROVIDER_SITE_OTHER): Payer: BC Managed Care – PPO | Admitting: Neurology

## 2020-07-27 VITALS — BP 150/76 | HR 60 | Ht 67.0 in | Wt 260.0 lb

## 2020-07-27 DIAGNOSIS — G4726 Circadian rhythm sleep disorder, shift work type: Secondary | ICD-10-CM | POA: Diagnosis not present

## 2020-07-27 DIAGNOSIS — R0683 Snoring: Secondary | ICD-10-CM | POA: Diagnosis not present

## 2020-07-27 NOTE — Progress Notes (Signed)
SLEEP MEDICINE CLINIC    Provider:  Melvyn Novas, MD  Primary Care Physician:  Anne Ng, NP [address] [place] [address]     Referring Provider: Anne Ng, Np [address],  [place] [address]          Chief Complaint according to patient   Patient presents with:    . New Patient (Initial Visit)     Patient with hypertension , ankle edema - Insomnia, chronically on Ambien, Night Shift Worker. 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  However for many decades she will not shifts at and she developed insomnia most likely in response to her shiftwork circadian rhythm.  She has been taking Ambien for a long time.  She also has gained weight and has experienced high blood pressures, given concerns for the possible presence of obstructive sleep apnea.  As far as she knows there has never been another sleep evaluation relation, and it seems that her primary care provider now is also concerned about the long-term use of Ambien.  Current BMI is 40.7 today's blood pressure was elevated at 150/76 mmHg.   The patient is not an active smoker and she is not drinking alcohol.   Catherine Baker a right -handed Black or African American female who  has a past medical history of Diabetes mellitus without complication (HCC) and Hypertension.   Sleep relevant medical history: no hisory of ENT trauma or surgery, TBI , no nocturia. Snoring was confirmed .  Family medical /sleep history: no OSA,  Daughter has insomnia, no sleep walkers. 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 No pets.Tobacco use*. none.  ETOH use ; none ,  Caffeine intake in form of Coffee( 1 cup in AM ) , sometimes soda 1/ 14 days, Tea (1/ 14 days) or energy drinks. Regular exercise- none .    Fully covid vaccinated.     Sleep habits are as follows: The patient's dinner time is between 5-7 PM. The patient goes to bed at 9.30 PM and takes Ambien  , she continues to sleep for 7 hours, no bathroom breaks.   The preferred sleep position is side ways, with the support of 3 pillows. Dreams are reportedly frequent/vivid.  5.30 AM is the usual rise time. The patient wakes up with an alarm.  She reports  feeling refreshed and restored in AM, when using AMBIEN. with symptoms such as dry mouth but no residual fatigue. Naps are taken infrequently.  Review of Systems: Out of a complete 14 system review, the patient complains of only the following symptoms, and all other reviewed systems are negative.:  Fatigue, sleepiness , snoring, fragmented sleep, Insomnia treated on Ambien.   How likely are you to doze in the following situations: 0 = not likely, 1 = slight chance, 2 = moderate chance, 3 = high chance   Sitting and Reading? Watching Television? Sitting inactive in a public place (theater or meeting)? As a passenger in a car for an hour without  a break? Lying down in the afternoon when circumstances permit? Sitting and talking to someone? Sitting quietly after lunch without alcohol? In a car, while stopped for a few minutes in traffic?   Total = 5/ 24 points   FSS endorsed at 18/ 63 points.  GDS 2/ 25 points on geriatric depression score   Social History   Socioeconomic History  . Marital status: Legally Separated    Spouse name: Not on file  . Number of children: 3  . Years of education: 62  . Highest education level: Not on file  Occupational History  . Occupation: Nurse  Tobacco Use  . Smoking status: Former Smoker    Packs/day: 2.00    Years: 11.00    Pack years:  22.00    Types: Cigarettes    Quit date: 1990    Years since quitting: 31.9  . Smokeless tobacco: Never Used  Vaping Use  . Vaping Use: Never used  Substance and Sexual Activity  . Alcohol use: Yes    Comment: occasional  . Drug use: No  . Sexual activity: Not on file  Other Topics Concern  . Not on file  Social History Narrative   Fun: Dance, read, walk, music.    Denies any religious beliefs effecting healthcare.    Social Determinants of Health   Financial Resource Strain: Not on file  Food Insecurity: Not on file  Transportation Needs: Not on file  Physical Activity: Not on file  Stress: Not on file  Social Connections: Not on file    Family History  Problem Relation Age of Onset  . Hypertension Mother   . Heart disease Mother   . Hyperlipidemia Mother   . Hypertension Father   . Heart disease Father   . Hyperlipidemia Father   . Breast cancer Maternal Grandmother     Past Medical History:  Diagnosis Date  . Diabetes mellitus without complication (HCC)   . Hypertension     Past Surgical History:  Procedure Laterality Date  . ABDOMINAL HYSTERECTOMY    . TUBAL LIGATION       Current Outpatient Medications on File Prior to Visit  Medication Sig Dispense Refill  . b complex vitamins tablet Take 1 tablet by mouth daily.    . furosemide (LASIX) 40 MG tablet Take 0.5 tablets (20 mg total) by mouth daily. 90 tablet 0  . ibuprofen (ADVIL) 600 MG tablet TAKE 1 TABLET BY MOUTH EVERY 8 HOURS AS NEEDED WITH FOOD 30 tablet 0  . pravastatin (PRAVACHOL) 40 MG tablet Take 1 tablet (40 mg total) by mouth daily. 90 tablet 3  . telmisartan (MICARDIS) 80 MG tablet Take 1 tablet (80 mg total) by mouth daily. 90 tablet 3  . zolpidem (AMBIEN) 5 MG tablet Take 1 tablet (5 mg total) by mouth at bedtime. No additional refill without sleep study and echocardiogram 9 tablet 0   No current facility-administered medications on file prior to visit.    Allergies  Allergen Reactions   . Codeine Hives, Nausea Only and Nausea And Vomiting    Physical exam:  Today's Vitals   07/27/20 1535  BP: (!) 150/76  Pulse: 60  Weight: 260 lb (117.9 kg)  Height: 5\' 7"  (1.702 m)   Body mass index is 40.72 kg/m.   Wt Readings from Last 3 Encounters:  07/27/20 260 lb (117.9 kg)  04/20/20 254 lb 9.6 oz (115.5 kg)  03/24/20 258 lb 3.2 oz (117.1 kg)     Ht Readings from  Last 3 Encounters:  07/27/20 5\' 7"  (1.702 m)  04/20/20 5\' 7"  (1.702 m)  03/24/20 5\' 7"  (1.702 m)      General: The patient is awake, alert and appears not in acute distress. The patient is well groomed. Head: Normocephalic, atraumatic. Neck is supple. Mallampati 2, but very low hanging- no lateral pillar,  neck circumference:16 inches . Nasal airflow  patent.  prognathia is seen.  Dental status: dentures upper- biological lower teeth.  Cardiovascular:  Regular rate and cardiac rhythm by pulse,  without distended neck veins. Respiratory: Lungs are clear to auscultation.  Skin:  Without evidence of ankle edema, or rash. Trunk: The patient's posture is erect.   Neurologic exam : The patient is awake and alert, oriented to place and time.   Memory subjective described as intact.  Attention span & concentration ability appears normal.  Speech is fluent,  without  dysarthria, dysphonia or aphasia.  Mood and affect are appropriate.   Cranial nerves: no loss of smell or taste reported  Pupils are equal and briskly reactive to light. Funduscopic exam deferred.  Extraocular movements in vertical and horizontal planes were intact and without nystagmus. No Diplopia. Visual fields by finger perimetry are intact. Hearing was intact to soft voice and finger rubbing.    Facial sensation intact to fine touch.  Facial motor strength is symmetric and tongue and uvula move midline.  Neck ROM : rotation, tilt and flexion extension were normal for age and shoulder shrug was symmetrical.    Motor exam:  Symmetric bulk,  tone and ROM.   Normal tone without cog wheeling, symmetric grip strength .   Sensory:  Fine touch,and vibration were normal. Sometimes arm and hand feel asleep-   Proprioception tested in the upper extremities was normal.   Coordination: Rapid alternating movements in the fingers/hands were of normal speed.  The Finger-to-nose maneuver was intact without evidence of ataxia, dysmetria or tremor.   Gait and station: Patient could rise unassisted from a seated position, walked without assistive device.  Stance is of normal width/ base and the patient turned with 3 steps.  Toe and heel walk were deferred.  Deep tendon reflexes: in the  upper and lower extremities are symmetric and intact.  Babinski response was deferred .       After spending a total time of 35  minutes face to face and additional time for physical and neurologic examination, review of laboratory studies,  personal review of imaging studies, reports and results of other testing and review of referral information / records as far as provided in visit, I have established the following assessments:  1)  Some risk factors for OSA- obesity, narrow airway, but no witnessed apnea.  2)   Circadian rhythm disorder  Shift work history which caused Insomnia, now dependent on Ambien.     My Plan is to proceed with:  1) HST  2) Trazodone is recommended to replace Ambien , can also take with melatonin together .    I would like to thank Nche, Bonna Gainsharlotte Lum, NP for allowing me to meet with and to take care of this pleasant patient.   In short, Catherine MulliganBeverly Baker is presenting with a longstanding insomnia successfully treated on AMBIEN- 5 mg, no dose increases, no grogginess , no memory loss, no parasomnia.   I plan to follow up either personally or through our NP within 3-4  month.   CC: I will share my notes with PCP   Electronically signed by:  Melvyn Novas, MD 07/27/2020 4:04 PM  [facility] [employer] certified by [employer] of Sleep Medicine and Diplomate of the [employer] of Sleep Medicine. Board certified In Neurology through the ABPN, Fellow of the [employer] of Neu[employer]. Medical Director of [employer].

## 2020-07-27 NOTE — Patient Instructions (Signed)
Trazodone tablets What is this medicine? TRAZODONE (TRAZ oh done) is used to treat depression. This medicine may be used for other purposes; ask your health care provider or pharmacist if you have questions. COMMON BRAND NAME(S): Desyrel What should I tell my health care provider before I take this medicine? They need to know if you have any of these conditions:  attempted suicide or thinking about it  bipolar disorder  bleeding problems  glaucoma  heart disease, or previous heart attack  irregular heart beat  kidney or liver disease  low levels of sodium in the blood  an unusual or allergic reaction to trazodone, other medicines, foods, dyes or preservatives  pregnant or trying to get pregnant  breast-feeding How should I use this medicine? Take this medicine by mouth with a glass of water. Follow the directions on the prescription label. Take this medicine shortly after a meal or a light snack. Take your medicine at regular intervals. Do not take your medicine more often than directed. Do not stop taking this medicine suddenly except upon the advice of your doctor. Stopping this medicine too quickly may cause serious side effects or your condition may worsen. A special MedGuide will be given to you by the pharmacist with each prescription and refill. Be sure to read this information carefully each time. Talk to your pediatrician regarding the use of this medicine in children. Special care may be needed. Overdosage: If you think you have taken too much of this medicine contact a poison control center or emergency room at once. NOTE: This medicine is only for you. Do not share this medicine with others. What if I miss a dose? If you miss a dose, take it as soon as you can. If it is almost time for your next dose, take only that dose. Do not take double or extra doses. What may interact with this medicine? Do not take this medicine with any of the following  medications:  certain medicines for fungal infections like fluconazole, itraconazole, ketoconazole, posaconazole, voriconazole  cisapride  dronedarone  linezolid  MAOIs like Carbex, Eldepryl, Marplan, Nardil, and Parnate  mesoridazine  methylene blue (injected into a vein)  pimozide  saquinavir  thioridazine This medicine may also interact with the following medications:  alcohol  antiviral medicines for HIV or AIDS  aspirin and aspirin-like medicines  barbiturates like phenobarbital  certain medicines for blood pressure, heart disease, irregular heart beat  certain medicines for depression, anxiety, or psychotic disturbances  certain medicines for migraine headache like almotriptan, eletriptan, frovatriptan, naratriptan, rizatriptan, sumatriptan, zolmitriptan  certain medicines for seizures like carbamazepine and phenytoin  certain medicines for sleep  certain medicines that treat or prevent blood clots like dalteparin, enoxaparin, warfarin  digoxin  fentanyl  lithium  NSAIDS, medicines for pain and inflammation, like ibuprofen or naproxen  other medicines that prolong the QT interval (cause an abnormal heart rhythm) like dofetilide  rasagiline  supplements like St. John's wort, kava kava, valerian  tramadol  tryptophan This list may not describe all possible interactions. Give your health care provider a list of all the medicines, herbs, non-prescription drugs, or dietary supplements you use. Also tell them if you smoke, drink alcohol, or use illegal drugs. Some items may interact with your medicine. What should I watch for while using this medicine? Tell your doctor if your symptoms do not get better or if they get worse. Visit your doctor or health care professional for regular checks on your progress. Because it may take   several weeks to see the full effects of this medicine, it is important to continue your treatment as prescribed by your  doctor. Patients and their families should watch out for new or worsening thoughts of suicide or depression. Also watch out for sudden changes in feelings such as feeling anxious, agitated, panicky, irritable, hostile, aggressive, impulsive, severely restless, overly excited and hyperactive, or not being able to sleep. If this happens, especially at the beginning of treatment or after a change in dose, call your health care professional. You may get drowsy or dizzy. Do not drive, use machinery, or do anything that needs mental alertness until you know how this medicine affects you. Do not stand or sit up quickly, especially if you are an older patient. This reduces the risk of dizzy or fainting spells. Alcohol may interfere with the effect of this medicine. Avoid alcoholic drinks. This medicine may cause dry eyes and blurred vision. If you wear contact lenses you may feel some discomfort. Lubricating drops may help. See your eye doctor if the problem does not go away or is severe. Your mouth may get dry. Chewing sugarless gum, sucking hard candy and drinking plenty of water may help. Contact your doctor if the problem does not go away or is severe. What side effects may I notice from receiving this medicine? Side effects that you should report to your doctor or health care professional as soon as possible:  allergic reactions like skin rash, itching or hives, swelling of the face, lips, or tongue  elevated mood, decreased need for sleep, racing thoughts, impulsive behavior  confusion  fast, irregular heartbeat  feeling faint or lightheaded, falls  feeling agitated, angry, or irritable  loss of balance or coordination  painful or prolonged erections  restlessness, pacing, inability to keep still  suicidal thoughts or other mood changes  tremors  trouble sleeping  seizures  unusual bleeding or bruising Side effects that usually do not require medical attention (report to your doctor  or health care professional if they continue or are bothersome):  change in sex drive or performance  change in appetite or weight  constipation  headache  muscle aches or pains  nausea This list may not describe all possible side effects. Call your doctor for medical advice about side effects. You may report side effects to FDA at 1-800-FDA-1088. Where should I keep my medicine? Keep out of the reach of children. Store at room temperature between 15 and 30 degrees C (59 to 86 degrees F). Protect from light. Keep container tightly closed. Throw away any unused medicine after the expiration date. NOTE: This sheet is a summary. It may not cover all possible information. If you have questions about this medicine, talk to your doctor, pharmacist, or health care provider.  2020 Elsevier/Gold Standard (2018-07-22 11:46:46)  

## 2020-07-31 ENCOUNTER — Other Ambulatory Visit: Payer: Self-pay | Admitting: Family

## 2020-07-31 DIAGNOSIS — G4700 Insomnia, unspecified: Secondary | ICD-10-CM

## 2020-07-31 DIAGNOSIS — R600 Localized edema: Secondary | ICD-10-CM

## 2020-07-31 DIAGNOSIS — M1711 Unilateral primary osteoarthritis, right knee: Secondary | ICD-10-CM

## 2020-07-31 DIAGNOSIS — I10 Essential (primary) hypertension: Secondary | ICD-10-CM

## 2020-07-31 MED ORDER — IBUPROFEN 600 MG PO TABS: ORAL_TABLET | ORAL | 0 refills | Status: DC

## 2020-07-31 MED ORDER — ZOLPIDEM TARTRATE 5 MG PO TABS: 5.0000 mg | ORAL_TABLET | Freq: Every day | ORAL | 0 refills | Status: DC

## 2020-08-03 ENCOUNTER — Telehealth: Payer: Self-pay

## 2020-08-03 DIAGNOSIS — I10 Essential (primary) hypertension: Secondary | ICD-10-CM

## 2020-08-03 MED ORDER — TELMISARTAN 80 MG PO TABS: 80.0000 mg | ORAL_TABLET | Freq: Every day | ORAL | 3 refills | Status: DC

## 2020-08-03 NOTE — Telephone Encounter (Signed)
Last OV 04/20/20 Last fill 03/24/20 #90/3

## 2020-08-17 ENCOUNTER — Telehealth: Payer: Self-pay | Admitting: Nurse Practitioner

## 2020-08-17 ENCOUNTER — Other Ambulatory Visit: Payer: Self-pay | Admitting: Nurse Practitioner

## 2020-08-17 DIAGNOSIS — Z1231 Encounter for screening mammogram for malignant neoplasm of breast: Secondary | ICD-10-CM

## 2020-08-17 NOTE — Telephone Encounter (Signed)
Patient states that she has received 2 phones calls from the office, but not sure what the calls are regarding. Please give her a call back.

## 2020-08-31 ENCOUNTER — Telehealth: Payer: Self-pay

## 2020-08-31 NOTE — Telephone Encounter (Signed)
LVM for pt to call me back to schedule sleep study  

## 2020-09-05 ENCOUNTER — Telehealth: Payer: Self-pay

## 2020-09-05 NOTE — Telephone Encounter (Signed)
We have attempted to call the patient two times to schedule sleep study.  Patient has been unavailable at the phone numbers we have on file and has not returned our calls. If patient calls back we will schedule them for their sleep study.  

## 2020-09-23 ENCOUNTER — Ambulatory Visit
Admission: RE | Admit: 2020-09-23 | Discharge: 2020-09-23 | Disposition: A | Discharge status: Home / Self Care | Payer: BC Managed Care – PPO | Source: Ambulatory Visit | Attending: Nurse Practitioner | Admitting: Nurse Practitioner

## 2020-09-23 ENCOUNTER — Other Ambulatory Visit: Payer: Self-pay

## 2020-09-23 DIAGNOSIS — Z1231 Encounter for screening mammogram for malignant neoplasm of breast: Secondary | ICD-10-CM

## 2020-09-28 ENCOUNTER — Ambulatory Visit (INDEPENDENT_AMBULATORY_CARE_PROVIDER_SITE_OTHER): Payer: BC Managed Care – PPO | Admitting: Neurology

## 2020-09-28 DIAGNOSIS — G4733 Obstructive sleep apnea (adult) (pediatric): Secondary | ICD-10-CM

## 2020-09-28 DIAGNOSIS — G4726 Circadian rhythm sleep disorder, shift work type: Secondary | ICD-10-CM

## 2020-09-28 DIAGNOSIS — R0683 Snoring: Secondary | ICD-10-CM

## 2020-10-18 ENCOUNTER — Other Ambulatory Visit: Payer: Self-pay | Admitting: Nurse Practitioner

## 2020-10-18 DIAGNOSIS — G4700 Insomnia, unspecified: Secondary | ICD-10-CM

## 2020-10-18 DIAGNOSIS — G4726 Circadian rhythm sleep disorder, shift work type: Secondary | ICD-10-CM

## 2020-10-19 MED ORDER — TRAZODONE HCL 50 MG PO TABS: 50.0000 mg | ORAL_TABLET | Freq: Every evening | ORAL | 0 refills | Status: DC | PRN

## 2020-10-19 NOTE — Telephone Encounter (Signed)
Pt notified and states she is not going to take the Trazodone because she has never took that before and she does not understand why she needs an appointment even after it had been explained to her. Pt transferred to front staff to make appointment.

## 2020-10-19 NOTE — Telephone Encounter (Signed)
Pt states she has completed her sleep study and she feels like she needs this medication again. Pt states she would also like to start back on 80/25 Micardis but cause she does not like taking just the 80 mg with lasix. Please advise

## 2020-10-27 ENCOUNTER — Other Ambulatory Visit: Payer: Self-pay

## 2020-10-28 ENCOUNTER — Encounter: Payer: Self-pay | Admitting: Nurse Practitioner

## 2020-10-28 ENCOUNTER — Ambulatory Visit (INDEPENDENT_AMBULATORY_CARE_PROVIDER_SITE_OTHER): Payer: BC Managed Care – PPO | Admitting: Nurse Practitioner

## 2020-10-28 VITALS — BP 124/88 | HR 84 | Temp 97.4°F | Ht 67.0 in | Wt 258.4 lb

## 2020-10-28 DIAGNOSIS — M1711 Unilateral primary osteoarthritis, right knee: Secondary | ICD-10-CM

## 2020-10-28 DIAGNOSIS — I10 Essential (primary) hypertension: Secondary | ICD-10-CM | POA: Diagnosis not present

## 2020-10-28 DIAGNOSIS — G4700 Insomnia, unspecified: Secondary | ICD-10-CM

## 2020-10-28 DIAGNOSIS — R600 Localized edema: Secondary | ICD-10-CM | POA: Diagnosis not present

## 2020-10-28 MED ORDER — TELMISARTAN-HCTZ 80-25 MG PO TABS: 1.0000 | ORAL_TABLET | Freq: Every day | ORAL | 3 refills | Status: DC

## 2020-10-28 MED ORDER — IBUPROFEN 600 MG PO TABS: 600.0000 mg | ORAL_TABLET | Freq: Three times a day (TID) | ORAL | 1 refills | Status: DC | PRN

## 2020-10-28 MED ORDER — ZOLPIDEM TARTRATE 5 MG PO TABS
5.0000 mg | ORAL_TABLET | Freq: Every evening | ORAL | 1 refills | Status: DC | PRN
Start: 2020-10-28 — End: 2021-02-14

## 2020-10-28 NOTE — Assessment & Plan Note (Signed)
Normal echo and normal home sleep test. Unable to tolerate trazodone 25-50mg  (dizziness and nausea).  Resume ambien 5mg  at hs prn

## 2020-10-28 NOTE — Progress Notes (Signed)
Subjective:  Patient ID: Catherine Baker, female    DOB: [birth year]  Age: 70 y.o. MRN: [id number]  CC: Follow-up (Pt would like to discuss recent medication changes (micardis/lasix and trazodone))  HPI  Insomnia Normal echo and normal home sleep test. Unable to tolerate trazodone 25-50mg  (dizziness and nausea).  Resume ambien 5mg  at hs prn  Bilateral leg edema Resolved Nausea with furosemide  D/c furosemide and resume micardis/HCTZ Continue DASH diet  Essential hypertension BP at goal Resume micardis/hctz D/c furosemide BP Readings from Last 3 Encounters:  10/28/20 124/88  07/27/20 (!) 150/76  04/20/20 136/88   Wt Readings from Last 3 Encounters:  10/28/20 258 lb 6.4 oz (117.2 kg)  07/27/20 260 lb (117.9 kg)  04/20/20 254 lb 9.6 oz (115.5 kg)   Reviewed past Medical, Social and Family history today.  Outpatient Medications Prior to Visit  Medication Sig Dispense Refill  . b complex vitamins tablet Take 1 tablet by mouth daily.    . pravastatin (PRAVACHOL) 40 MG tablet Take 1 tablet (40 mg total) by mouth daily. 90 tablet 3  . ibuprofen (ADVIL) 600 MG tablet TAKE 1 TABLET BY MOUTH EVERY 8 HOURS AS NEEDED WITH FOOD 30 tablet 0  . telmisartan (MICARDIS) 80 MG tablet Take 1 tablet (80 mg total) by mouth daily. 90 tablet 3  . traZODone (DESYREL) 50 MG tablet Take 1-2 tablets (50-100 mg total) by mouth at bedtime as needed for sleep. 30 tablet 0  . furosemide (LASIX) 40 MG tablet Take 0.5 tablets (20 mg total) by mouth daily. (Patient not taking: Reported on 10/28/2020) 90 tablet 0   No facility-administered medications prior to visit.    ROS See HPI  Objective:  BP 124/88 (BP Location: Left Arm, Patient Position: Sitting, Cuff Size: Large)   Pulse 84   Temp (!) 97.4 F (36.3 C) (Temporal)   Ht 5\' 7"  (1.702 m)   Wt 258 lb 6.4 oz (117.2 kg)   SpO2 98%   BMI 40.47 kg/m   Physical Exam Constitutional:      Appearance: She is obese.  Cardiovascular:     Rate  and Rhythm: Normal rate.     Pulses: Normal pulses.  Pulmonary:     Effort: Pulmonary effort is normal.  Musculoskeletal:     Right lower leg: No edema.     Left lower leg: No edema.  Neurological:     Mental Status: She is alert and oriented to person, place, and time.    Assessment & Plan:  This visit occurred during the SARS-CoV-2 public health emergency.  Safety protocols were in place, including screening questions prior to the visit, additional usage of staff PPE, and extensive cleaning of exam room while observing appropriate contact time as indicated for disinfecting solutions.   Shawni was seen today for follow-up.  Diagnoses and all orders for this visit:  Essential hypertension -     telmisartan-hydrochlorothiazide (MICARDIS HCT) 80-25 MG tablet; Take 1 tablet by mouth daily.  Osteoarthritis of right knee, unspecified osteoarthritis type -     ibuprofen (ADVIL) 600 MG tablet; Take 1 tablet (600 mg total) by mouth every 8 (eight) hours as needed. TAKE 1 TABLET BY MOUTH EVERY 8 HOURS AS NEEDED WITH FOOD  Insomnia, unspecified type -     zolpidem (AMBIEN) 5 MG tablet; Take 1 tablet (5 mg total) by mouth at bedtime as needed for sleep.  Bilateral leg edema   Problem List Items Addressed This Visit  Cardiovascular and Mediastinum   Essential hypertension - Primary    BP at goal Resume micardis/hctz D/c furosemide BP Readings from Last 3 Encounters:  10/28/20 124/88  07/27/20 (!) 150/76  04/20/20 136/88        Relevant Medications   telmisartan-hydrochlorothiazide (MICARDIS HCT) 80-25 MG tablet     Musculoskeletal and Integument   Osteoarthritis of right knee   Relevant Medications   ibuprofen (ADVIL) 600 MG tablet     Other   Bilateral leg edema    Resolved Nausea with furosemide  D/c furosemide and resume micardis/HCTZ Continue DASH diet      Insomnia    Normal echo and normal home sleep test. Unable to tolerate trazodone 25-50mg  (dizziness  and nausea).  Resume ambien 5mg  at hs prn      Relevant Medications   zolpidem (AMBIEN) 5 MG tablet      Follow-up: Return in about 1 month (around 11/28/2020) for CPE (fasting).  11/30/2020, NP

## 2020-10-28 NOTE — Assessment & Plan Note (Signed)
Resolved Nausea with furosemide  D/c furosemide and resume micardis/HCTZ Continue DASH diet

## 2020-10-28 NOTE — Assessment & Plan Note (Signed)
BP at goal Resume micardis/hctz D/c furosemide BP Readings from Last 3 Encounters:  10/28/20 124/88  07/27/20 (!) 150/76  04/20/20 136/88

## 2020-11-07 ENCOUNTER — Telehealth: Payer: Self-pay | Admitting: Neurology

## 2020-11-07 NOTE — Patient Instructions (Signed)
   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  However for many decades she will not shifts at and she developed insomnia most likely in response to her shiftwork circadian rhythm.  She has been taking Ambien for a long time.  She also has gained weight and has experienced high blood pressures, given concerns for the possible presence of obstructive sleep apnea.  As far as she knows there has never been another sleep evaluation relation, and it seems that her primary care provider now is also concerned about the long-term use of Ambien.  Current BMI is 40.7 today's blood pressure was elevated at 150/76 mmHg.   Epworth sleepiness score: 5/24.  BMI: 40.8 kg/m  FINDINGS:   Total Record Time (hours, min): 9hrs   Total Sleep Time (hours, min):  8hrs    Percent REM (%): 26.1   Calculated pAHI (per hour):  18.5       REM pAHI:    23.4    NREM pAHI: 17.2   Oxygen Saturation (%) Mean: 94 Minimum oxygen saturation (%):     90   O2 Saturation Range (%): 90-98%  O2Saturation (minutes) <=88%: 0.0   Pulse Mean (bpm): 70  Pulse Range (43 - 118)   IMPRESSION: OSA (obstructive sleep apnea) Primary snoring    RECOMMENDATION:     INTERPRETING PHYSICIAN:    Melvyn Novas, MD  Board Certified in Neurology and Sleep Medicine  [facility] [address] [place], [place] [address] [phone]

## 2020-11-07 NOTE — Telephone Encounter (Signed)
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 She has been taking Ambien for a long time. She also has gained weight and has experienced high blood pressures,given concerns for the possible presence of obstructive sleep apnea. As far as she knows there has never been another sleep evaluation relation, and it seems that her primary care provider now is also concerned about the long-term use of Ambien. Current BMI is 40.7 today's blood pressure was elevated at 150/76 mmHg.  Epworth sleepiness score: 5/24.  BMI: 40.8 kg/m  FINDINGS:   Total Record Time (hours, min):         9 h 65m  Total Sleep Time (hours, min):           8 h  3 m       Percent REM (%): 26.1             Calculated pAHI (per hour):    18.5                             REM pAHI:    23.4                   NREM pAHI:  17.2   Oxygen Saturation (%) Mean:            94        Minimum oxygen saturation (%):     90             O2 Saturation Range (%): 90-98%                 O2Saturation (minutes) <=88%:         0.0   Pulse Mean (bpm): 70                        Pulse Range (43 - 118)            IMPRESSION: This HST documented a  mild- moderate form of OSA (obstructive sleep apnea) with additional snoring. The data were not available until 11-07-2020 and I am very sorry for this delay.     RECOMMENDATION:  Autotitration CPAP, dental device or Inspire device all can be used to treat this uncomplicated OSA.  There is an exclusion for Inspire due to BMI - BMI has to be 32 or lower.     INTERPRETING PHYSICIAN:   Melvyn Novas, MD  11-07-2020 Medical director of [facility].  Board Certified in Neurology and Sleep Medicine  [facility] [address], [address] [place], [place] [address] [phone]

## 2020-11-08 ENCOUNTER — Other Ambulatory Visit: Payer: Self-pay | Admitting: Neurology

## 2020-11-08 DIAGNOSIS — R0683 Snoring: Secondary | ICD-10-CM

## 2020-11-08 DIAGNOSIS — G4726 Circadian rhythm sleep disorder, shift work type: Secondary | ICD-10-CM

## 2020-11-08 DIAGNOSIS — G4733 Obstructive sleep apnea (adult) (pediatric): Secondary | ICD-10-CM

## 2020-11-08 NOTE — Telephone Encounter (Signed)
Pt returned call.D I advised pt that Dr. Vickey Huger reviewed their sleep study results and found that pt has sleep apnea. Dr. Vickey Huger recommends that pt starts auto CPAP. I reviewed PAP compliance expectations with the pt. Pt is agreeable to starting a CPAP. I advised pt that an order will be sent to a DME, [facility] and [facility] will call the pt within about one week after they file with the pt's insurance. [facility]) will show the pt how to use the machine, fit for masks, and troubleshoot the CPAP if needed. A follow up appt will need to be made for insurance purposes with Dr. Vickey Huger or the NP within 31-90 days from the date. A letter with all of this information in it will be mailed to the pt as a reminder. I verified with the pt that the address we have on file is correct. Pt verbalized understanding of results. Pt had no questions at this time but was encouraged to call back if questions arise. I have sent the order to [facility]) and have received confirmation that they have received the order.

## 2020-11-08 NOTE — Telephone Encounter (Signed)
Attempted to call the patient and review SSR. She answered but was in a class and unable to take the call at this time. Pt states she will call back.

## 2021-02-13 ENCOUNTER — Other Ambulatory Visit: Payer: Self-pay | Admitting: Nurse Practitioner

## 2021-02-13 DIAGNOSIS — G4700 Insomnia, unspecified: Secondary | ICD-10-CM

## 2021-02-18 ENCOUNTER — Other Ambulatory Visit: Payer: Self-pay | Admitting: Nurse Practitioner

## 2021-02-18 DIAGNOSIS — M1711 Unilateral primary osteoarthritis, right knee: Secondary | ICD-10-CM

## 2021-02-21 ENCOUNTER — Other Ambulatory Visit: Payer: Self-pay

## 2021-02-21 DIAGNOSIS — M1711 Unilateral primary osteoarthritis, right knee: Secondary | ICD-10-CM

## 2021-02-21 MED ORDER — IBUPROFEN 600 MG PO TABS: 600.0000 mg | ORAL_TABLET | Freq: Three times a day (TID) | ORAL | 0 refills | Status: DC | PRN

## 2021-05-15 ENCOUNTER — Other Ambulatory Visit: Payer: Self-pay | Admitting: Family Medicine

## 2021-05-15 ENCOUNTER — Other Ambulatory Visit: Payer: Self-pay | Admitting: Nurse Practitioner

## 2021-05-15 DIAGNOSIS — G4700 Insomnia, unspecified: Secondary | ICD-10-CM

## 2021-05-15 DIAGNOSIS — M1711 Unilateral primary osteoarthritis, right knee: Secondary | ICD-10-CM

## 2021-05-17 ENCOUNTER — Telehealth: Payer: Self-pay | Admitting: Nurse Practitioner

## 2021-05-17 NOTE — Telephone Encounter (Signed)
What is the name of the medication?  ibuprofen (ADVIL) 600 MG tablet [[id number]]  zolpidem (AMBIEN) 5 MG tablet [[id number]]  telmisartan-hydrochlorothiazide (MICARDIS HCT) 80-25 MG tablet [[id number]]     Have you contacted your pharmacy to request a refill? Pt is needing refills on all these scripts.    Which pharmacy would you like this sent to? [facility] [address] [place], [place] - [address]  [address], [place] [address]  Phone:  [phone]  Fax:  [phone]    Patient notified that their request is being sent to the clinical staff for review and that they should receive a call once it is complete. If they do not receive a call within 72 hours they can check with their pharmacy or our office.

## 2021-05-19 ENCOUNTER — Other Ambulatory Visit: Payer: Self-pay | Admitting: Nurse Practitioner

## 2021-05-19 ENCOUNTER — Telehealth: Payer: Self-pay | Admitting: Nurse Practitioner

## 2021-05-19 DIAGNOSIS — M1711 Unilateral primary osteoarthritis, right knee: Secondary | ICD-10-CM

## 2021-05-19 DIAGNOSIS — G4700 Insomnia, unspecified: Secondary | ICD-10-CM

## 2021-05-19 NOTE — Telephone Encounter (Signed)
Duplicate, this has been handled in another encounter

## 2021-05-22 NOTE — Telephone Encounter (Signed)
Pt called to follow up on this because she was told they were denied but hadnt heard anything on why. Please advise

## 2021-05-24 ENCOUNTER — Telehealth: Payer: Self-pay | Admitting: Nurse Practitioner

## 2021-05-24 DIAGNOSIS — M1711 Unilateral primary osteoarthritis, right knee: Secondary | ICD-10-CM

## 2021-05-25 ENCOUNTER — Other Ambulatory Visit: Payer: Self-pay

## 2021-05-25 DIAGNOSIS — M1711 Unilateral primary osteoarthritis, right knee: Secondary | ICD-10-CM

## 2021-05-25 MED ORDER — IBUPROFEN 600 MG PO TABS: 600.0000 mg | ORAL_TABLET | Freq: Three times a day (TID) | ORAL | 0 refills | Status: DC | PRN

## 2021-05-25 NOTE — Telephone Encounter (Signed)
Pt states she did contact pharmacy and will be going to get medications later. She states she did not get the cpap machine due to cost.

## 2021-05-25 NOTE — Telephone Encounter (Signed)
Pt needs appt, she has not been seen since 10/2020 and canceled 2 f/u appointments.

## 2021-05-25 NOTE — Telephone Encounter (Signed)
See note

## 2021-05-25 NOTE — Telephone Encounter (Signed)
Refill request for: Ibuprofen 600 mg LR  02/21/21, #30, 0 rfs LOV 10/28/20 FOV 06/01/21  Please review and advise.  Thanks.  Dm/cma

## 2021-05-26 NOTE — Telephone Encounter (Signed)
Pt notified and verbalized understanding.

## 2021-06-01 ENCOUNTER — Encounter: Payer: Self-pay | Admitting: Nurse Practitioner

## 2021-06-01 ENCOUNTER — Ambulatory Visit (INDEPENDENT_AMBULATORY_CARE_PROVIDER_SITE_OTHER): Payer: Self-pay | Admitting: Nurse Practitioner

## 2021-06-01 ENCOUNTER — Other Ambulatory Visit: Payer: Self-pay

## 2021-06-01 VITALS — BP 120/62 | HR 86 | Temp 97.8°F | Ht 67.0 in | Wt 251.0 lb

## 2021-06-01 DIAGNOSIS — I10 Essential (primary) hypertension: Secondary | ICD-10-CM

## 2021-06-01 DIAGNOSIS — E039 Hypothyroidism, unspecified: Secondary | ICD-10-CM

## 2021-06-01 DIAGNOSIS — E782 Mixed hyperlipidemia: Secondary | ICD-10-CM

## 2021-06-01 DIAGNOSIS — E1165 Type 2 diabetes mellitus with hyperglycemia: Secondary | ICD-10-CM

## 2021-06-01 NOTE — Assessment & Plan Note (Signed)
Repeat hgbA1c Advised about the importance of low carb/low fat/high fiber diet and daily exercise.

## 2021-06-01 NOTE — Assessment & Plan Note (Signed)
BP at goal BP Readings from Last 3 Encounters:  06/01/21 120/62  10/28/20 124/88  07/27/20 (!) 150/76   Repeat BMP

## 2021-06-01 NOTE — Assessment & Plan Note (Signed)
Unable to provide reason for discontinuing pravastatin No diet modification implemented  Repeat lipid panel to re eval CVD/CAD risk

## 2021-06-01 NOTE — Patient Instructions (Addendum)
Contact [facility] to discuss Ambien refill and getting CPAP machine: [phone]. You also need to schedule an appt with Dr. Porfirio Mylar Dohmeier.  Go to [address] for fasting lab. Need to be fasting 8hrs prior to blood draw.  Will let you know about f/up appt after review of lab results.

## 2021-06-01 NOTE — Progress Notes (Signed)
Subjective:  Patient ID: Catherine Baker, female    DOB: February 22, 1951  Age: 70 y.o. MRN: [id number]  CC: Follow-up (F/u for medication refills. Pt states she does not have ambien medication and is unable to get in touch with the sleep clinic. )  HPI  Mixed hyperlipidemia Unable to provide reason for discontinuing pravastatin No diet modification implemented  Repeat lipid panel to re eval CVD/CAD risk  Type 2 diabetes mellitus Repeat hgbA1c Advised about the importance of low carb/low fat/high fiber diet and daily exercise.  Essential hypertension BP at goal BP Readings from Last 3 Encounters:  06/01/21 120/62  10/28/20 124/88  07/27/20 (!) 150/76   Repeat BMP  Hypothyroidism Repeat TSH and T4 asymptomatic  Reviewed past Medical, Social and Family history today.  Outpatient Medications Prior to Visit  Medication Sig Dispense Refill   b complex vitamins tablet Take 1 tablet by mouth daily.     ibuprofen (ADVIL) 600 MG tablet Take 1 tablet (600 mg total) by mouth every 8 (eight) hours as needed. TAKE 1 TABLET BY MOUTH EVERY 8 HOURS AS NEEDED WITH FOOD 30 tablet 0   pravastatin (PRAVACHOL) 40 MG tablet Take 1 tablet (40 mg total) by mouth daily. 90 tablet 3   telmisartan-hydrochlorothiazide (MICARDIS HCT) 80-25 MG tablet Take 1 tablet by mouth daily. 90 tablet 3   zolpidem (AMBIEN) 5 MG tablet TAKE 1 TABLET BY MOUTH AT BEDTIME AS NEEDED FOR SLEEP 30 tablet 0   No facility-administered medications prior to visit.    ROS See HPI  Objective:  BP 120/62 (BP Location: Left Arm, Patient Position: Sitting, Cuff Size: Large)   Pulse 86   Temp 97.8 F (36.6 C) (Temporal)   Ht 5\' 7"  (1.702 m)   Wt 251 lb (113.9 kg)   SpO2 98%   BMI 39.31 kg/m   Physical Exam Constitutional:      Appearance: She is obese.  Cardiovascular:     Rate and Rhythm: Normal rate and regular rhythm.     Pulses: Normal pulses.     Heart sounds: Normal heart sounds.  Pulmonary:     Effort:  Pulmonary effort is normal.     Breath sounds: Normal breath sounds.  Musculoskeletal:     Right lower leg: No edema.     Left lower leg: No edema.  Neurological:     Mental Status: She is alert and oriented to person, place, and time.  Psychiatric:        Mood and Affect: Mood normal.        Behavior: Behavior normal.        Thought Content: Thought content normal.    Assessment & Plan:  This visit occurred during the SARS-CoV-2 public health emergency.  Safety protocols were in place, including screening questions prior to the visit, additional usage of staff PPE, and extensive cleaning of exam room while observing appropriate contact time as indicated for disinfecting solutions.   Danyal was seen today for follow-up.  Diagnoses and all orders for this visit:  Mixed hyperlipidemia -     Lipid panel; Future -     Hepatic function panel; Future  Essential hypertension -     Basic metabolic panel; Future  Hypothyroidism, unspecified type -     T4, free; Future -     TSH; Future  Type 2 diabetes mellitus with hyperglycemia, without long-term current use of insulin (HCC) -     Hemoglobin A1c; Future  Contact [facility] to discuss  Ambien refill and getting CPAP machine: [phone]. schedule an appt with Dr. Porfirio Mylar Dohmeier.  Problem List Items Addressed This Visit       Cardiovascular and Mediastinum   Essential hypertension    BP at goal BP Readings from Last 3 Encounters:  06/01/21 120/62  10/28/20 124/88  07/27/20 (!) 150/76   Repeat BMP      Relevant Orders   Basic metabolic panel     Endocrine   Hypothyroidism    Repeat TSH and T4 asymptomatic      Relevant Orders   T4, free   TSH   Type 2 diabetes mellitus (HCC)    Repeat hgbA1c Advised about the importance of low carb/low fat/high fiber diet and daily exercise.      Relevant Orders   Hemoglobin A1c     Other   Mixed hyperlipidemia - Primary    Unable to provide reason  for discontinuing pravastatin No diet modification implemented  Repeat lipid panel to re eval CVD/CAD risk      Relevant Orders   Lipid panel   Hepatic function panel    Follow-up: Return in about 6 months (around 11/30/2021) for DM and HTN, hyperlipidemia (fasting).  Alysia Penna, NP

## 2021-06-01 NOTE — Assessment & Plan Note (Signed)
Repeat TSH and T4 asymptomatic

## 2021-06-02 ENCOUNTER — Other Ambulatory Visit (INDEPENDENT_AMBULATORY_CARE_PROVIDER_SITE_OTHER): Payer: PPO

## 2021-06-02 DIAGNOSIS — E1165 Type 2 diabetes mellitus with hyperglycemia: Secondary | ICD-10-CM

## 2021-06-02 DIAGNOSIS — E039 Hypothyroidism, unspecified: Secondary | ICD-10-CM

## 2021-06-02 DIAGNOSIS — E782 Mixed hyperlipidemia: Secondary | ICD-10-CM | POA: Diagnosis not present

## 2021-06-02 DIAGNOSIS — I10 Essential (primary) hypertension: Secondary | ICD-10-CM

## 2021-06-02 LAB — LIPID PANEL
Cholesterol: 222 mg/dL — ABNORMAL HIGH (ref 0–200)
HDL: 60.2 mg/dL (ref >39.00)
LDL Cholesterol: 147 mg/dL — ABNORMAL HIGH (ref 0–99)
NonHDL: 161.6
Total CHOL/HDL Ratio: 4
Triglycerides: 73 mg/dL (ref 0.0–149.0)
VLDL: 14.6 mg/dL (ref 0.0–40.0)

## 2021-06-02 LAB — BASIC METABOLIC PANEL
BUN: 21 mg/dL (ref 6–23)
CO2: 27 mEq/L (ref 19–32)
Calcium: 9.8 mg/dL (ref 8.4–10.5)
Chloride: 102 mEq/L (ref 96–112)
Creatinine, Ser: 0.95 mg/dL (ref 0.40–1.20)
GFR: 61.53 mL/min (ref >60.00)
Glucose, Bld: 128 mg/dL — ABNORMAL HIGH (ref 70–99)
Potassium: 3.6 mEq/L (ref 3.5–5.1)
Sodium: 137 mEq/L (ref 135–145)

## 2021-06-02 LAB — HEPATIC FUNCTION PANEL
ALT: 15 U/L (ref 0–35)
AST: 15 U/L (ref 0–37)
Albumin: 4.1 g/dL (ref 3.5–5.2)
Alkaline Phosphatase: 114 U/L (ref 39–117)
Bilirubin, Direct: 0 mg/dL (ref 0.0–0.3)
Total Bilirubin: 0.5 mg/dL (ref 0.2–1.2)
Total Protein: 7.3 g/dL (ref 6.0–8.3)

## 2021-06-02 LAB — TSH: TSH: 3.22 u[IU]/mL (ref 0.35–5.50)

## 2021-06-02 LAB — T4, FREE: Free T4: 0.91 ng/dL (ref 0.60–1.60)

## 2021-06-02 LAB — HEMOGLOBIN A1C: Hgb A1c MFr Bld: 6.4 % (ref 4.6–6.5)

## 2021-06-07 MED ORDER — ROSUVASTATIN CALCIUM 20 MG PO TABS: 20.0000 mg | ORAL_TABLET | Freq: Every day | ORAL | 3 refills | Status: DC

## 2021-06-07 MED ORDER — TELMISARTAN-HCTZ 80-25 MG PO TABS: 1.0000 | ORAL_TABLET | Freq: Every day | ORAL | 3 refills | Status: DC

## 2021-06-07 NOTE — Assessment & Plan Note (Signed)
hgbA1c at 6.4% controlled

## 2021-06-07 NOTE — Assessment & Plan Note (Addendum)
Abnormal lipid panel: elevated TC and LDL. You have a 23% risk of cardiovascular/cerebrovascular disease. Stop pravastatin and start crestor 20mg . New rx sent Daily exercise and low fat/low carb diet are also essential to improve above numbers and prevent development of diabetes and heart disease. Stable thyroid, liver and renal function F/up in 51months (fasting-8hrs prior to appt)

## 2021-06-07 NOTE — Assessment & Plan Note (Signed)
Stable TSH and T4: continue current dose

## 2021-06-20 ENCOUNTER — Ambulatory Visit: Payer: BC Managed Care – PPO

## 2021-06-22 ENCOUNTER — Ambulatory Visit: Payer: BC Managed Care – PPO | Admitting: Nurse Practitioner

## 2021-06-27 ENCOUNTER — Telehealth: Payer: Self-pay | Admitting: Neurology

## 2021-06-27 NOTE — Telephone Encounter (Signed)
Pt called,  Can not sleep, need medication to help me sleep. Would like a call from the nurse to discuss a sooner appt than 09/26/21.

## 2021-06-27 NOTE — Telephone Encounter (Signed)
Called the pt back. It reviewing her chart it looks like it was advised that she start CPAP for treatment. I don't see any follow up visits so unsure if the patient started therapy or not.  There was no answer. LVM asking for pt to call back.  I am reaching out to DME company [facility] to see if her orders from February are still active and if they can get he set up. If so patient should be redirected to contact [facility] at [phone] to see about getting set up with therapy. We should let her get set up with cpap and keep the visit in feb as an initial cpap visit.  If she waits until after 12/15 then most likely will have to restart process through insurance guidelines.

## 2021-06-28 NOTE — Telephone Encounter (Signed)
Per [facility]  "the CSR called the patient to get scheduled she was adamant that she did not want to move forward with it. This was on 03/28/2021"  Pt would need to get established with CPAP for the treatment of her sleep problems/concerns

## 2021-07-26 ENCOUNTER — Other Ambulatory Visit: Payer: Self-pay

## 2021-07-27 ENCOUNTER — Telehealth: Payer: Self-pay | Admitting: Nurse Practitioner

## 2021-07-27 ENCOUNTER — Encounter: Payer: Self-pay | Admitting: Nurse Practitioner

## 2021-07-27 ENCOUNTER — Ambulatory Visit (INDEPENDENT_AMBULATORY_CARE_PROVIDER_SITE_OTHER): Payer: PPO | Admitting: Nurse Practitioner

## 2021-07-27 VITALS — BP 120/80 | HR 77 | Temp 97.1°F | Ht 67.0 in | Wt 251.2 lb

## 2021-07-27 DIAGNOSIS — G4701 Insomnia due to medical condition: Secondary | ICD-10-CM | POA: Diagnosis not present

## 2021-07-27 DIAGNOSIS — E782 Mixed hyperlipidemia: Secondary | ICD-10-CM | POA: Diagnosis not present

## 2021-07-27 DIAGNOSIS — G4733 Obstructive sleep apnea (adult) (pediatric): Secondary | ICD-10-CM | POA: Diagnosis not present

## 2021-07-27 MED ORDER — PRAVASTATIN SODIUM 40 MG PO TABS: 40.0000 mg | ORAL_TABLET | Freq: Every day | ORAL | 5 refills | Status: DC

## 2021-07-27 MED ORDER — ZOLPIDEM TARTRATE 5 MG PO TABS: 5.0000 mg | ORAL_TABLET | Freq: Every evening | ORAL | 0 refills | Status: DC | PRN

## 2021-07-27 NOTE — Assessment & Plan Note (Signed)
Unable to tolerate crestor. Reports muscle aches and headache. She is requesting to resume pravastatin. States she had no adverse reaction with pravastatin.  Resume pravastatin Repeat lipid panel in 3-67months

## 2021-07-27 NOTE — Progress Notes (Signed)
Subjective:  Patient ID: Catherine Baker, female    DOB: Sep 18, 1950  Age: 70 y.o. MRN: [id number]  CC: Follow-up (Pt would like to discuss sleep medication. Pt states that she was informed by the sleep clinic that she would have to address refills for her Ambien with her PCP. )  HPI  OSA (obstructive sleep apnea) Sleep study reports indicates  Mild to moderate OSA with snoring. autotitrated CPAP or dental device was recommended. Catherine Baker reports she is not able to get CPAP machine till 08/08/2021. She states she was told CPAP machine was on back order. She has another appt with Dr. Vickey Baker on 09/26/2020. Today she continues to have difficulty with insomnia. No improvement with trazodone, melatonin or benadryl. Ambien last filled 02/14/2021, 5mg , #30.  Collaborated with Supervising Physician: safe to resume low dose Ambien while waiting for CPAP machine. Advised Catherine Baker about her risk or respiratory depression, daytime somnolence and to avoid driving when tired or fatigued. Also advised that med will not be refilled without use of CPAP machine. ambien 5mg  sent Maintain upcoming appt with Dr. Andrey Baker. F/up in 66months  Mixed hyperlipidemia Unable to tolerate crestor. Reports muscle aches and headache. She is requesting to resume pravastatin. States she had no adverse reaction with pravastatin.  Resume pravastatin Repeat lipid panel in 3-91months  Reviewed past Medical, Social and Family history today.  Outpatient Medications Prior to Visit  Medication Sig Dispense Refill   b complex vitamins tablet Take 1 tablet by mouth daily.     ibuprofen (ADVIL) 600 MG tablet Take 1 tablet (600 mg total) by mouth every 8 (eight) hours as needed. TAKE 1 TABLET BY MOUTH EVERY 8 HOURS AS NEEDED WITH FOOD 30 tablet 0   telmisartan-hydrochlorothiazide (MICARDIS HCT) 80-25 MG tablet Take 1 tablet by mouth daily. 90 tablet 3   zolpidem (AMBIEN) 5 MG tablet TAKE 1 TABLET BY MOUTH AT BEDTIME AS NEEDED  FOR SLEEP 30 tablet 0   rosuvastatin (CRESTOR) 20 MG tablet Take 1 tablet (20 mg total) by mouth at bedtime. (Patient not taking: Reported on 07/27/2021) 90 tablet 3   No facility-administered medications prior to visit.    ROS See HPI  Objective:  BP 120/80 (BP Location: Left Arm, Patient Position: Sitting, Cuff Size: Large)    Pulse 77    Temp (!) 97.1 F (36.2 C) (Temporal)    Ht 5\' 7"  (1.702 m)    Wt 251 lb 3.2 oz (113.9 kg)    SpO2 98%    BMI 39.34 kg/m   Physical Exam Cardiovascular:     Rate and Rhythm: Normal rate.     Pulses: Normal pulses.  Pulmonary:     Effort: Pulmonary effort is normal.  Neurological:     Mental Status: She is alert and oriented to person, place, and time.   Assessment & Plan:  This visit occurred during the SARS-CoV-2 public health emergency.  Safety protocols were in place, including screening questions prior to the visit, additional usage of staff PPE, and extensive cleaning of exam room while observing appropriate contact time as indicated for disinfecting solutions.   Catherine Baker was seen today for follow-up.  Diagnoses and all orders for this visit:  OSA (obstructive sleep apnea)  Insomnia due to medical condition -     zolpidem (AMBIEN) 5 MG tablet; Take 1 tablet (5 mg total) by mouth at bedtime as needed. for sleep  Mixed hyperlipidemia -     pravastatin (PRAVACHOL) 40 MG tablet; Take 1 tablet (  40 mg total) by mouth daily. Advised about the risk of respiratory depression, cardiac arrhythmia, CVA, and cardiomyopathy due to untreated OSA.  Problem List Items Addressed This Visit       Respiratory   OSA (obstructive sleep apnea) - Primary    Sleep study reports indicates  Mild to moderate OSA with snoring. autotitrated CPAP or dental device was recommended. Catherine Baker reports she is not able to get CPAP machine till 08/08/2021. She states she was told CPAP machine was on back order. She has another appt with Dr. Vickey Baker on  09/26/2020. Today she continues to have difficulty with insomnia. No improvement with trazodone, melatonin or benadryl. Ambien last filled 02/14/2021, 5mg , #30.  Collaborated with Supervising Physician: safe to resume low dose Ambien while waiting for CPAP machine. Advised Catherine Baker about her risk or respiratory depression, daytime somnolence and to avoid driving when tired or fatigued. Also advised that med will not be refilled without use of CPAP machine. ambien 5mg  sent Maintain upcoming appt with Dr. Andrey Baker. F/up in 79months        Other   Insomnia   Relevant Medications   zolpidem (AMBIEN) 5 MG tablet   Mixed hyperlipidemia    Unable to tolerate crestor. Reports muscle aches and headache. She is requesting to resume pravastatin. States she had no adverse reaction with pravastatin.  Resume pravastatin Repeat lipid panel in 3-31months      Relevant Medications   pravastatin (PRAVACHOL) 40 MG tablet     Follow-up: Return in about 4 months (around 11/25/2021) for CPE (fasting).  11month, NP

## 2021-07-27 NOTE — Telephone Encounter (Signed)
LVM for patient to return call and schedule CPE appointment.

## 2021-07-27 NOTE — Telephone Encounter (Signed)
Patient will receive her CPAP in the last week of December. 22

## 2021-07-27 NOTE — Patient Instructions (Signed)
Ambien refill sent I will not be able to provide additional refills without use of CPAP machine. Beware of daytime somnolence and avoid driving when tired or fatigued  Living With Sleep Apnea Sleep apnea is a condition in which breathing pauses or becomes shallow during sleep. Sleep apnea is most commonly caused by a collapsed or blocked airway. People with sleep apnea usually snore loudly. They may have times when they gasp and stop breathing for 10 seconds or more during sleep. This may happen many times during the night. The breaks in breathing also interrupt the deep sleep that you need to feel rested. Even if you do not completely wake up from the gaps in breathing, your sleep may not be restful and you feel tired during the day. You may also have a headache in the morning and low energy during the day, and you may feel anxious or depressed. How can sleep apnea affect me? Sleep apnea increases your chances of extreme tiredness during the day (daytime fatigue). It can also increase your risk for health conditions, such as: Heart attack. Stroke. Obesity. Type 2 diabetes. Heart failure. Irregular heartbeat. High blood pressure. If you have daytime fatigue as a result of sleep apnea, you may be more likely to: Perform poorly at school or work. Fall asleep while driving. Have difficulty with attention. Develop depression or anxiety. Have sexual dysfunction. What actions can I take to manage sleep apnea? Sleep apnea treatment  If you were given a device to open your airway while you sleep, use it only as told by your health care provider. You may be given: An oral appliance. This is a custom-made mouthpiece that shifts your lower jaw forward. A continuous positive airway pressure (CPAP) device. This device blows air through a mask when you breathe out (exhale). A nasal expiratory positive airway pressure (EPAP) device. This device has valves that you put into each nostril. A bi-level  positive airway pressure (BIPAP) device. This device blows air through a mask when you breathe in (inhale) and breathe out (exhale). You may need surgery if other treatments do not work for you. Sleep habits Go to sleep and wake up at the same time every day. This helps set your internal clock (circadian rhythm) for sleeping. If you stay up later than usual, such as on weekends, try to get up in the morning within 2 hours of your normal wake time. Try to get at least 7-9 hours of sleep each night. Stop using a computer, tablet, and mobile phone a few hours before bedtime. Do not take long naps during the day. If you nap, limit it to 30 minutes. Have a relaxing bedtime routine. Reading or listening to music may relax you and help you sleep. Use your bedroom only for sleep. Keep your television and computer out of your bedroom. Keep your bedroom cool, dark, and quiet. Use a supportive mattress and pillows. Follow your health care provider's instructions for other changes to sleep habits. Nutrition Do not eat heavy meals in the evening. Do not have caffeine in the later part of the day. The effects of caffeine can last for more than 5 hours. Follow your health care provider's or dietitian's instructions for any diet changes. Lifestyle   Do not drink alcohol before bedtime. Alcohol can cause you to fall asleep at first, but then it can cause you to wake up in the middle of the night and have trouble getting back to sleep. Do not use any products that contain  nicotine or tobacco. These products include cigarettes, chewing tobacco, and vaping devices, such as e-cigarettes. If you need help quitting, ask your health care provider. Medicines Take over-the-counter and prescription medicines only as told by your health care provider. Do not use over-the-counter sleep medicine. You can become dependent on this medicine, and it can make sleep apnea worse. Do not use medicines, such as sedatives and  narcotics, unless told by your health care provider. Activity Exercise on most days, but avoid exercising in the evening. Exercising near bedtime can interfere with sleeping. If possible, spend time outside every day. Natural light helps regulate your circadian rhythm. General information Lose weight if you need to, and maintain a healthy weight. Keep all follow-up visits. This is important. If you are having surgery, make sure to tell your health care provider that you have sleep apnea. You may need to bring your device with you. Where to find more information Learn more about sleep apnea and daytime fatigue from: American Sleep Association: sleepassociation.org National Sleep Foundation: sleepfoundation.org National Heart, Lung, and Blood Institute: [employer] Summary Sleep apnea is a condition in which breathing pauses or becomes shallow during sleep. Sleep apnea can cause daytime fatigue and other serious health conditions. You may need to wear a device while sleeping to help keep your airway open. If you are having surgery, make sure to tell your health care provider that you have sleep apnea. You may need to bring your device with you. Making changes to sleep habits, diet, lifestyle, and activity can help you manage sleep apnea. This information is not intended to replace advice given to you by your health care provider. Make sure you discuss any questions you have with your health care provider. Document Revised: 03/08/2021 Document Reviewed: 07/08/2020 Elsevier Patient Education  2022 [employer].

## 2021-07-27 NOTE — Assessment & Plan Note (Addendum)
Sleep study reports indicates  Mild to moderate OSA with snoring. autotitrated CPAP or dental device was recommended. Catherine Baker reports she is not able to get CPAP machine till 08/08/2021. She states she was told CPAP machine was on back order. She has another appt with Dr. Vickey Huger on 09/26/2020. Today she continues to have difficulty with insomnia. No improvement with trazodone, melatonin or benadryl. Ambien last filled 02/14/2021, 5mg , #30.  Collaborated with Supervising Physician: safe to resume low dose Ambien while waiting for CPAP machine. Advised Catherine Baker about her risk or respiratory depression, daytime somnolence and to avoid driving when tired or fatigued. Also advised that med will not be refilled without use of CPAP machine. ambien 5mg  sent Maintain upcoming appt with Dr. Andrey Campanile. F/up in 34months

## 2021-08-08 DIAGNOSIS — G4733 Obstructive sleep apnea (adult) (pediatric): Secondary | ICD-10-CM | POA: Diagnosis not present

## 2021-08-11 ENCOUNTER — Telehealth: Payer: Self-pay | Admitting: Nurse Practitioner

## 2021-08-11 NOTE — Telephone Encounter (Signed)
Left message for patient to call back and schedule Medicare Annual Wellness Visit (AWV) in office.  ° °If not able to come in office, please offer to do virtually or by telephone.  Left office number and my jabber #[phone]. ° °Due for AWVI ° °Please schedule at anytime with Nurse Health Advisor. °  °

## 2021-08-28 ENCOUNTER — Telehealth: Payer: Self-pay | Admitting: Nurse Practitioner

## 2021-08-28 NOTE — Telephone Encounter (Signed)
Left message for patient to call back and schedule Medicare Annual Wellness Visit (AWV) in office.  ° °If not able to come in office, please offer to do virtually or by telephone.  Left office number and my jabber #[phone]. ° °Due for AWVI ° °Please schedule at anytime with Nurse Health Advisor. °  °

## 2021-08-29 ENCOUNTER — Telehealth: Payer: Self-pay | Admitting: Nurse Practitioner

## 2021-08-29 NOTE — Telephone Encounter (Signed)
error 

## 2021-08-30 ENCOUNTER — Other Ambulatory Visit: Payer: Self-pay | Admitting: Nurse Practitioner

## 2021-08-30 DIAGNOSIS — G4701 Insomnia due to medical condition: Secondary | ICD-10-CM

## 2021-08-30 NOTE — Telephone Encounter (Signed)
Patient called and wanted to make sure refill was received from [facility]. Patient said she has been using her C pap. Patient said she has one pill left.

## 2021-09-06 ENCOUNTER — Ambulatory Visit (INDEPENDENT_AMBULATORY_CARE_PROVIDER_SITE_OTHER): Payer: PPO

## 2021-09-06 DIAGNOSIS — Z0000 Encounter for general adult medical examination without abnormal findings: Secondary | ICD-10-CM | POA: Diagnosis not present

## 2021-09-06 NOTE — Progress Notes (Signed)
Subjective:   Catherine Baker is a 71 y.o. female who presents for an Initial Medicare Annual Wellness Visit.  I connected with Izola Ebrahim today by telephone and verified that I am speaking with the correct person using two identifiers. Location patient: home Location provider: work Persons participating in the virtual visit: patient, provider.   I discussed the limitations, risks, security and privacy concerns of performing an evaluation and management service by telephone and the availability of in person appointments. I also discussed with the patient that there may be a patient responsible charge related to this service. The patient expressed understanding and verbally consented to this telephonic visit.    Interactive audio and video telecommunications were attempted between this provider and patient, however failed, due to patient having technical difficulties OR patient did not have access to video capability.  We continued and completed visit with audio only.    Review of Systems           Objective:    There were no vitals filed for this visit. There is no height or weight on file to calculate BMI.  Advanced Directives 09/14/2018 07/26/2015  Does Patient Have a Medical Advance Directive? No No  Would patient like information on creating a medical advance directive? - No - patient declined information    Current Medications (verified) Outpatient Encounter Medications as of 09/06/2021  Medication Sig   b complex vitamins tablet Take 1 tablet by mouth daily.   ibuprofen (ADVIL) 600 MG tablet Take 1 tablet (600 mg total) by mouth every 8 (eight) hours as needed. TAKE 1 TABLET BY MOUTH EVERY 8 HOURS AS NEEDED WITH FOOD   pravastatin (PRAVACHOL) 40 MG tablet Take 1 tablet (40 mg total) by mouth daily.   telmisartan-hydrochlorothiazide (MICARDIS HCT) 80-25 MG tablet Take 1 tablet by mouth daily.   zolpidem (AMBIEN) 5 MG tablet TAKE 1 TABLET BY MOUTH AT BEDTIME AS NEEDED FOR  SLEEP   No facility-administered encounter medications on file as of 09/06/2021.    Allergies (verified) Codeine and Crestor [rosuvastatin calcium]   History: Past Medical History:  Diagnosis Date   Adjustment disorder with mixed anxiety and depressed mood 12/09/2019   Diabetes mellitus without complication (HCC)    Hypertension    Past Surgical History:  Procedure Laterality Date   ABDOMINAL HYSTERECTOMY     TUBAL LIGATION     Family History  Problem Relation Age of Onset   Hypertension Mother    Heart disease Mother    Hyperlipidemia Mother    Hypertension Father    Heart disease Father    Hyperlipidemia Father    Breast cancer Maternal Grandmother    Social History   Socioeconomic History   Marital status: Legally Separated    Spouse name: Not on file   Number of children: 3   Years of education: 14   Highest education level: Not on file  Occupational History   Occupation: Nurse  Tobacco Use   Smoking status: Former    Packs/day: 2.00    Years: 11.00    Pack years: 22.00    Types: Cigarettes    Quit date: 1990    Years since quitting: 33.0   Smokeless tobacco: Never  Vaping Use   Vaping Use: Never used  Substance and Sexual Activity   Alcohol use: Yes    Comment: occasional   Drug use: No   Sexual activity: Not on file  Other Topics Concern   Not on file  Social History  Narrative   Fun: Dance, read, walk, music.    Denies any religious beliefs effecting healthcare.    Social Determinants of Health   Financial Resource Strain: Not on file  Food Insecurity: Not on file  Transportation Needs: Not on file  Physical Activity: Not on file  Stress: Not on file  Social Connections: Not on file    Tobacco Counseling Counseling given: Not Answered   Clinical Intake:                 Diabetic?no          Activities of Daily Living No flowsheet data found.  Patient Care Team: Nche, Charlene Brooke, NP as PCP - General (Internal  Medicine)  Indicate any recent Medical Services you may have received from other than [facility] providers in the past year (date may be approximate).     Assessment:   This is a routine wellness examination for [place].  Hearing/Vision screen No results found.  Dietary issues and exercise activities discussed:     Goals Addressed   None    Depression Screen PHQ 2/9 Scores 06/01/2021 12/01/2019 12/11/2017  PHQ - 2 Score 0 0 0  PHQ- 9 Score 0 - -    Fall Risk Fall Risk  06/01/2021 12/01/2019 12/01/2019 12/11/2017  Falls in the past year? 0 0 - No  Number falls in past yr: 0 0 0 -  Injury with Fall? 0 0 0 -  Risk for fall due to : No Fall Risks - - -  Follow up Falls evaluation completed - - -    FALL RISK PREVENTION PERTAINING TO THE HOME:  Any stairs in or around the home? Yes  If so, are there any without handrails? No  Home free of loose throw rugs in walkways, pet beds, electrical cords, etc? Yes  Adequate lighting in your home to reduce risk of falls? Yes   ASSISTIVE DEVICES UTILIZED TO PREVENT FALLS:  Life alert? No  Use of a cane, walker or w/c? No  Grab bars in the bathroom? No  Shower chair or bench in shower? No  Elevated toilet seat or a handicapped toilet? No    Cognitive Function:  Normal cognitive status assessed by direct observation by this Nurse Health Advisor. No abnormalities found.        Immunizations  There is no immunization history on file for this patient.  TDAP status: Up to date  Flu Vaccine status: Up to date  Pneumococcal vaccine status: Up to date  Covid-19 vaccine status: Completed vaccines  Qualifies for Shingles Vaccine? Yes   Zostavax completed No   Shingrix Completed?: No.    Education has been provided regarding the importance of this vaccine. Patient has been advised to call insurance company to determine out of pocket expense if they have not yet received this vaccine. Advised may also receive vaccine at local pharmacy or  Health Dept. Verbalized acceptance and understanding.  Screening Tests Health Maintenance  Topic Date Due   COVID-19 Vaccine (1) Never done   COLONOSCOPY (Pts 45-58yrs Insurance coverage will need to be confirmed)  Never done   OPHTHALMOLOGY EXAM  09/13/2016   FOOT EXAM  04/30/2020   Zoster Vaccines- Shingrix (1 of 2) 10/25/2021 (Originally 11/02/2002)   INFLUENZA VACCINE  11/10/2021 (Originally 03/13/2021)   TETANUS/TDAP  06/01/2022 (Originally 11/02/1971)   Pneumonia Vaccine 44+ Years old (1 - PCV) 07/27/2022 (Originally 11/02/1956)   MAMMOGRAM  09/23/2021   HEMOGLOBIN A1C  12/01/2021   DEXA  SCAN  Completed   Hepatitis C Screening  Completed   HPV VACCINES  Aged Out    Health Maintenance  Health Maintenance Due  Topic Date Due   COVID-19 Vaccine (1) Never done   COLONOSCOPY (Pts 45-28yrs Insurance coverage will need to be confirmed)  Never done   OPHTHALMOLOGY EXAM  09/13/2016   FOOT EXAM  04/30/2020    Colorectal cancer screening: Referral to GI placed Patient will discuss with PCP. Pt aware the office will call re: appt.  Mammogram status: Completed 09/23/2020. Repeat every year  Bone Density status: Completed 08/20/2019. Results reflect: Bone density results: NORMAL. Repeat every 10 years.  Lung Cancer Screening: (Low Dose CT Chest recommended if Age 84-80 years, 30 pack-year currently smoking OR have quit w/in 15years.) does not qualify.   Lung Cancer Screening Referral: n/a  Additional Screening:  Hepatitis C Screening: does not qualify; Completed 10/02/2018  Vision Screening: Recommended annual ophthalmology exams for early detection of glaucoma and other disorders of the eye. Is the patient up to date with their annual eye exam?  Yes  Who is the provider or what is the name of the office in which the patient attends annual eye exams? [facility]  If pt is not established with a provider, would they like to be referred to a provider to establish care? No .   Dental  Screening: Recommended annual dental exams for proper oral hygiene  Community Resource Referral / Chronic Care Management: CRR required this visit?  No   CCM required this visit?  No      Plan:     I have personally reviewed and noted the following in the patients chart:   Medical and social history Use of alcohol, tobacco or illicit drugs  Current medications and supplements including opioid prescriptions. Patient is not currently taking opioid prescriptions. Functional ability and status Nutritional status Physical activity Advanced directives List of other physicians Hospitalizations, surgeries, and ER visits in previous 12 months Vitals Screenings to include cognitive, depression, and falls Referrals and appointments  In addition, I have reviewed and discussed with patient certain preventive protocols, quality metrics, and best practice recommendations. A written personalized care plan for preventive services as well as general preventive health recommendations were provided to patient.     Randel Pigg, LPN   [id number]   Nurse Notes: none

## 2021-09-06 NOTE — Patient Instructions (Signed)
Catherine Baker , Thank you for taking time to come for your Medicare Wellness Visit. I appreciate your ongoing commitment to your health goals. Please review the following plan we discussed and let me know if I can assist you in the future.   Screening recommendations/referrals: Colonoscopy: will discuss with PCP  Mammogram: 09/23/2020 Bone Density: 08/20/2019 Recommended yearly ophthalmology/optometry visit for glaucoma screening and checkup Recommended yearly dental visit for hygiene and checkup  Vaccinations: Influenza vaccine: completed  Pneumococcal vaccine: completed  Tdap vaccine: completed  Shingles vaccine: will consider     Advanced directives: none   Conditions/risks identified: none   Next appointment: none    Preventive Care 65 Years and Older, Female Preventive care refers to lifestyle choices and visits with your health care provider that can promote health and wellness. What does preventive care include? A yearly physical exam. This is also called an annual well check. Dental exams once or twice a year. Routine eye exams. Ask your health care provider how often you should have your eyes checked. Personal lifestyle choices, including: Daily care of your teeth and gums. Regular physical activity. Eating a healthy diet. Avoiding tobacco and drug use. Limiting alcohol use. Practicing safe sex. Taking low-dose aspirin every day. Taking vitamin and mineral supplements as recommended by your health care provider. What happens during an annual well check? The services and screenings done by your health care provider during your annual well check will depend on your age, overall health, lifestyle risk factors, and family history of disease. Counseling  Your health care provider may ask you questions about your: Alcohol use. Tobacco use. Drug use. Emotional well-being. Home and relationship well-being. Sexual activity. Eating habits. History of falls. Memory and  ability to understand (cognition). Work and work Astronomer. Reproductive health. Screening  You may have the following tests or measurements: Height, weight, and BMI. Blood pressure. Lipid and cholesterol levels. These may be checked every 5 years, or more frequently if you are over 53 years old. Skin check. Lung cancer screening. You may have this screening every year starting at age 63 if you have a 30-pack-year history of smoking and currently smoke or have quit within the past 15 years. Fecal occult blood test (FOBT) of the stool. You may have this test every year starting at age 83. Flexible sigmoidoscopy or colonoscopy. You may have a sigmoidoscopy every 5 years or a colonoscopy every 10 years starting at age 61. Hepatitis C blood test. Hepatitis B blood test. Sexually transmitted disease (STD) testing. Diabetes screening. This is done by checking your blood sugar (glucose) after you have not eaten for a while (fasting). You may have this done every 1-3 years. Bone density scan. This is done to screen for osteoporosis. You may have this done starting at age 29. Mammogram. This may be done every 1-2 years. Talk to your health care provider about how often you should have regular mammograms. Talk with your health care provider about your test results, treatment options, and if necessary, the need for more tests. Vaccines  Your health care provider may recommend certain vaccines, such as: Influenza vaccine. This is recommended every year. Tetanus, diphtheria, and acellular pertussis (Tdap, Td) vaccine. You may need a Td booster every 10 years. Zoster vaccine. You may need this after age 16. Pneumococcal 13-valent conjugate (PCV13) vaccine. One dose is recommended after age 64. Pneumococcal polysaccharide (PPSV23) vaccine. One dose is recommended after age 30. Talk to your health care provider about which screenings and vaccines  you need and how often you need them. This information is  not intended to replace advice given to you by your health care provider. Make sure you discuss any questions you have with your health care provider. Document Released: 08/26/2015 Document Revised: 04/18/2016 Document Reviewed: 05/31/2015 Elsevier Interactive Patient Education  2017 [employer] Prevention in the Home Falls can cause injuries. They can happen to people of all ages. There are many things you can do to make your home safe and to help prevent falls. What can I do on the outside of my home? Regularly fix the edges of walkways and driveways and fix any cracks. Remove anything that might make you trip as you walk through a door, such as a raised step or threshold. Trim any bushes or trees on the path to your home. Use bright outdoor lighting. Clear any walking paths of anything that might make someone trip, such as rocks or tools. Regularly check to see if handrails are loose or broken. Make sure that both sides of any steps have handrails. Any raised decks and porches should have guardrails on the edges. Have any leaves, snow, or ice cleared regularly. Use sand or salt on walking paths during winter. Clean up any spills in your garage right away. This includes oil or grease spills. What can I do in the bathroom? Use night lights. Install grab bars by the toilet and in the tub and shower. Do not use towel bars as grab bars. Use non-skid mats or decals in the tub or shower. If you need to sit down in the shower, use a plastic, non-slip stool. Keep the floor dry. Clean up any water that spills on the floor as soon as it happens. Remove soap buildup in the tub or shower regularly. Attach bath mats securely with double-sided non-slip rug tape. Do not have throw rugs and other things on the floor that can make you trip. What can I do in the bedroom? Use night lights. Make sure that you have a light by your bed that is easy to reach. Do not use any sheets or blankets that  are too big for your bed. They should not hang down onto the floor. Have a firm chair that has side arms. You can use this for support while you get dressed. Do not have throw rugs and other things on the floor that can make you trip. What can I do in the kitchen? Clean up any spills right away. Avoid walking on wet floors. Keep items that you use a lot in easy-to-reach places. If you need to reach something above you, use a strong step stool that has a grab bar. Keep electrical cords out of the way. Do not use floor polish or wax that makes floors slippery. If you must use wax, use non-skid floor wax. Do not have throw rugs and other things on the floor that can make you trip. What can I do with my stairs? Do not leave any items on the stairs. Make sure that there are handrails on both sides of the stairs and use them. Fix handrails that are broken or loose. Make sure that handrails are as long as the stairways. Check any carpeting to make sure that it is firmly attached to the stairs. Fix any carpet that is loose or worn. Avoid having throw rugs at the top or bottom of the stairs. If you do have throw rugs, attach them to the floor with carpet tape. Make sure  that you have a light switch at the top of the stairs and the bottom of the stairs. If you do not have them, ask someone to add them for you. What else can I do to help prevent falls? Wear shoes that: Do not have high heels. Have rubber bottoms. Are comfortable and fit you well. Are closed at the toe. Do not wear sandals. If you use a stepladder: Make sure that it is fully opened. Do not climb a closed stepladder. Make sure that both sides of the stepladder are locked into place. Ask someone to hold it for you, if possible. Clearly mark and make sure that you can see: Any grab bars or handrails. First and last steps. Where the edge of each step is. Use tools that help you move around (mobility aids) if they are needed. These  include: Canes. Walkers. Scooters. Crutches. Turn on the lights when you go into a dark area. Replace any light bulbs as soon as they burn out. Set up your furniture so you have a clear path. Avoid moving your furniture around. If any of your floors are uneven, fix them. If there are any pets around you, be aware of where they are. Review your medicines with your doctor. Some medicines can make you feel dizzy. This can increase your chance of falling. Ask your doctor what other things that you can do to help prevent falls. This information is not intended to replace advice given to you by your health care provider. Make sure you discuss any questions you have with your health care provider. Document Released: 05/26/2009 Document Revised: 01/05/2016 Document Reviewed: 09/03/2014 Elsevier Interactive Patient Education  2017 [employer].

## 2021-09-08 DIAGNOSIS — G4733 Obstructive sleep apnea (adult) (pediatric): Secondary | ICD-10-CM | POA: Diagnosis not present

## 2021-09-21 ENCOUNTER — Encounter: Payer: Self-pay | Admitting: Neurology

## 2021-09-26 ENCOUNTER — Other Ambulatory Visit: Payer: Self-pay

## 2021-09-26 ENCOUNTER — Encounter: Payer: Self-pay | Admitting: Neurology

## 2021-09-26 ENCOUNTER — Ambulatory Visit: Payer: PPO | Admitting: Neurology

## 2021-09-26 VITALS — BP 139/56 | HR 69 | Ht 67.0 in | Wt 252.0 lb

## 2021-09-26 DIAGNOSIS — G4726 Circadian rhythm sleep disorder, shift work type: Secondary | ICD-10-CM

## 2021-09-26 DIAGNOSIS — R0683 Snoring: Secondary | ICD-10-CM

## 2021-09-26 DIAGNOSIS — G4733 Obstructive sleep apnea (adult) (pediatric): Secondary | ICD-10-CM | POA: Diagnosis not present

## 2021-09-26 DIAGNOSIS — G4701 Insomnia due to medical condition: Secondary | ICD-10-CM

## 2021-09-26 MED ORDER — ZOLPIDEM TARTRATE 5 MG PO TABS: 5.0000 mg | ORAL_TABLET | Freq: Every evening | ORAL | 0 refills | Status: DC | PRN

## 2021-09-26 NOTE — Progress Notes (Addendum)
SLEEP MEDICINE CLINIC    Provider:  Melvyn Novasarmen  Siddarth Hsiung, MD  Primary Care Physician:  Anne NgNche, Charlotte Lum, NP [address] [place] [id number]     Referring Provider: Anne NgNche, Charlotte Lum, Np [address] [place] [id number]          Chief Complaint according to patient   Patient presents with:     New Patient (Initial Visit)     Patient with hypertension , ankle edema - Insomnia, chronically on Ambien, Night Shift Worker. concerned about OSA       HISTORY OF PRESENT ILLNESS:  Catherine Baker is a 71 - year- old  African American female patient and was seen in CONSULTATION   requested by NP Nche. She is now here on a RV on 09/26/2021 : 09-26-2021: Catherine Baker underwent a HST while in reclined position- and had an AHI of 18/h  no hypoxia and bradycardia intermittently. She was ordered a CPAP, received a Luna machine- The data I can review today ranged from 27 August 2021 to 13 February 30 days and with an average usage time of 4 hours and 7 minutes so the patient is straddling to get enough hours daily and to CPAP use.  The ramp pressure started for a minimum pressure is maintained at 5 and a maximum pressure of 12 cm water, average residual AHI is 1.8/h which is very good, average leak is 14.1 L/min, she does not produce many central apneas and the average 95th percentile pressure is only 7 cm water.  So what I like for Catherine Baker to do is to try to use the machine for 5 hours or more each day so that we have enough time logged.  The day by day review of data shows that she has controlled her apnea on all of the days in the last 30 days. She is using a nasal mask. She likes it but reported a skin eruption- she may need a silk sleeve for the headgear. Needs to log more hours.         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  However for many  decades she will not shifts at and she developed insomnia most likely in response to her shiftwork circadian rhythm.  She has been taking Ambien for a long time.  She also has gained weight and has experienced high blood pressures, given concerns for the possible presence of obstructive sleep apnea.  As far as she knows there has never been another sleep evaluation relation, and it seems that her primary care provider now is also concerned about the long-term use of Ambien.  Current BMI is 40.7 today's blood pressure was elevated at 150/76 mmHg.   The patient is not an active smoker and she is not drinking alcohol. Catherine Baker a right -handed Black or [place] female who  has a past medical history of Adjustment disorder with mixed anxiety and depressed mood (12/09/2019), Diabetes mellitus without complication (HCC), and Hypertension.   Sleep relevant medical history: no hisory of ENT trauma or surgery, TBI , no nocturia. Snoring was confirmed .  Family medical /sleep history: no OSA,  Daughter has insomnia, no sleep walkers. 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 No pets.Tobacco use*. none.  ETOH use ; none ,  Caffeine intake in form of Coffee( 1 cup in AM ) , sometimes soda 1/ 14 days, Tea (1/ 14 days) or energy drinks.Regular exercise- none .   Fully covid vaccinated.     Sleep habits are as follows: The patient's dinner time is between 5-7 PM. The patient goes to bed at 9.30 PM and takes Ambien  , she continues to sleep for 7 hours, no bathroom breaks.   The preferred sleep position is side ways, with the support of 3 pillows. Dreams are reportedly frequent/vivid.  5.30 AM is the usual rise time. The patient wakes up with an alarm.  She reports  feeling refreshed and restored in AM, when using AMBIEN. with symptoms such as dry mouth but no residual fatigue. Naps are taken infrequently.  Review of  Systems: Out of a complete 14 system review, the patient complains of only the following symptoms, and all other reviewed systems are negative.:  Fatigue, sleepiness , snoring, fragmented sleep, Insomnia treated on Ambien.   How likely are you to doze in the following situations: 0 = not likely, 1 = slight chance, 2 = moderate chance, 3 = high chance   Sitting and Reading? Watching Television? Sitting inactive in a public place (theater or meeting)? As a passenger in a car for an hour without a break? Lying down in the afternoon when circumstances permit? Sitting and talking to someone? Sitting quietly after lunch without alcohol? In a car, while stopped for a few minutes in traffic?   Total = 4/ 24 points   FSS endorsed at 18/ 63 points.  GDS 2/ 25 points on geriatric depression score   Social History   Socioeconomic History   Marital status: Legally Separated    Spouse name: Not on file   Number of children: 3   Years of education: 14   Highest education level: Not on file  Occupational History   Occupation: Nurse  Tobacco Use   Smoking status: Former    Packs/day: 2.00    Years: 11.00    Pack years: 22.00    Types: Cigarettes    Quit date: 1990    Years since quitting: 33.1   Smokeless tobacco: Never  Vaping Use   Vaping Use: Never used  Substance and Sexual Activity   Alcohol use: Yes    Comment: occasional   Drug use: No   Sexual activity: Not on file  Other Topics Concern   Not on file  Social History Narrative   Fun: Dance, read, walk, music.    Denies any religious beliefs effecting healthcare.    Social Determinants of Health   Financial Resource Strain: Low Risk    Difficulty of Paying Living Expenses: Not hard at all  Food Insecurity: No Food Insecurity   Worried About Programme researcher, broadcasting/film/video in the Last Year: Never true   Ran Out of Food in the Last Year: Never true  Transportation Needs: No Transportation Needs   Lack of Transportation (Medical):  No   Lack of Transportation (Non-Medical): No  Physical Activity: Sufficiently Active   Days of Exercise per Week: 3 days   Minutes of Exercise per Session: 60 min  Stress: No Stress Concern Present   Feeling of Stress : Not at all  Social Connections: Moderately Integrated   Frequency of Communication with Friends and Family: More than three times a week   Frequency of Social Gatherings with Friends and Family: More than three times a  week   Attends Religious Services: More than 4 times per year   Active Member of Clubs or Organizations: Yes   Attends Engineer, structural: More than 4 times per year   Marital Status: Divorced    Family History  Problem Relation Age of Onset   Hypertension Mother    Heart disease Mother    Hyperlipidemia Mother    Hypertension Father    Heart disease Father    Hyperlipidemia Father    Breast cancer Maternal Grandmother     Past Medical History:  Diagnosis Date   Adjustment disorder with mixed anxiety and depressed mood 12/09/2019   Diabetes mellitus without complication (HCC)    Hypertension     Past Surgical History:  Procedure Laterality Date   ABDOMINAL HYSTERECTOMY     TUBAL LIGATION       Current Outpatient Medications on File Prior to Visit  Medication Sig Dispense Refill   b complex vitamins tablet Take 1 tablet by mouth daily.     ibuprofen (ADVIL) 600 MG tablet Take 1 tablet (600 mg total) by mouth every 8 (eight) hours as needed. TAKE 1 TABLET BY MOUTH EVERY 8 HOURS AS NEEDED WITH FOOD 30 tablet 0   telmisartan-hydrochlorothiazide (MICARDIS HCT) 80-25 MG tablet Take 1 tablet by mouth daily. 90 tablet 3   zolpidem (AMBIEN) 5 MG tablet TAKE 1 TABLET BY MOUTH AT BEDTIME AS NEEDED FOR SLEEP 30 tablet 1   No current facility-administered medications on file prior to visit.    Allergies  Allergen Reactions   Codeine Hives, Nausea Only and Nausea And Vomiting   Crestor [Rosuvastatin Calcium] Other (See Comments)     Myalgia and headache    Physical exam:  Today's Vitals   09/26/21 1549  BP: (!) 139/56  Pulse: 69  Weight: 252 lb (114.3 kg)  Height: 5\' 7"  (1.702 m)   Body mass index is 39.47 kg/m.   Wt Readings from Last 3 Encounters:  09/26/21 252 lb (114.3 kg)  07/27/21 251 lb 3.2 oz (113.9 kg)  06/01/21 251 lb (113.9 kg)     Ht Readings from Last 3 Encounters:  09/26/21 5\' 7"  (1.702 m)  07/27/21 5\' 7"  (1.702 m)  06/01/21 5\' 7"  (1.702 m)      General: The patient is awake, alert and appears not in acute distress. The patient is well groomed. Head: Normocephalic, atraumatic. Neck is supple. Mallampati 2, but very low hanging- no lateral pillar,  neck circumference:16 inches . Nasal airflow  patent.  prognathia is seen.  Dental status: dentures upper- biological lower teeth.  Cardiovascular:  Regular rate and cardiac rhythm by pulse,  without distended neck veins. Respiratory: Lungs are clear to auscultation.  Skin:  Without evidence of ankle edema, or rash. Trunk: The patient's posture is erect.   Neurologic exam : The patient is awake and alert, oriented to place and time.   Memory subjective described as intact.  Attention span & concentration ability appears normal.  Speech is fluent,  without  dysarthria, dysphonia or aphasia.  Mood and affect are appropriate.   Cranial nerves: no loss of smell or taste reported  Pupils are equal and briskly reactive to light. Funduscopic exam deferred.  Extraocular movements in vertical and horizontal planes were intact and without nystagmus. No Diplopia. Visual fields by finger perimetry are intact. Hearing was intact to soft voice and finger rubbing.    Facial sensation intact to fine touch.  Facial motor strength is symmetric and tongue  and uvula move midline.  Neck ROM : rotation, tilt and flexion extension were normal for age and shoulder shrug was symmetrical.    Motor exam:  Symmetric bulk, tone and ROM.   Normal tone without cog  wheeling, symmetric grip strength .   Sensory:  Fine touch,and vibration were normal. Sometimes arm and hand feel asleep-   Proprioception tested in the upper extremities was normal.   Coordination: Rapid alternating movements in the fingers/hands were of normal speed.  The Finger-to-nose maneuver was intact without evidence of ataxia, dysmetria or tremor.   Gait and station: Patient could rise unassisted from a seated position, walked without assistive device.  Stance is of normal width/ base and the patient turned with 3 steps.  Toe and heel walk were deferred.  Deep tendon reflexes: in the  upper and lower extremities are symmetric and intact.  Babinski response was deferred .       After spending a total time of 35  minutes face to face and additional time for physical and neurologic examination, review of laboratory studies,  personal review of imaging studies, reports and results of other testing and review of referral information / records as far as provided in visit, I have established the following assessments:  1)   confirmed OSA- with the known risk factors of obesity, narrow airway, but no witnessed apnea. Well controlled on CPAP, but overall use is below 4 hours.  2)   Circadian rhythm disorder -Shift work history which caused Insomnia, now dependent on Ambien.     My Plan is to proceed with:  1) continue CPAP at current settings  for a minimum of 4 hours each night- with the current mask and a silk sleeve for her headgear to prevent eruption. Marland Kitchen  2) Trazodone was recommended to replace Ambien , can also take with melatonin together . Patient reports it doesn't work. She has been on Ambien for years and ws maintained by her PCPs. She should try to go to 5 mg and then try 1/2 tab.    I would like to thank Nche, Bonna Gains, NP for allowing me to meet with and to take care of this pleasant patient.   In short, Catherine Baker is presenting with a longstanding insomnia  successfully treated on AMBIEN- 5 mg, no dose increases, no grogginess , no memory loss, no parasomnia.   I plan to follow up either personally or through our NP within 12 months.   CC: I will share my notes with PCP   Electronically signed by: Melvyn Novas, MD 09/26/2021 4:10 PM  [facility] Board certified by The [employer] of Sleep Medicine and Diplomate of the [employer] of Sleep Medicine. Board certified In Neurology through the ABPN, Fellow of the [employer] of Neurology. Medical Director of [facility].

## 2021-09-27 NOTE — Progress Notes (Signed)
CM sent to [facility] for new order ?

## 2021-10-09 DIAGNOSIS — G4733 Obstructive sleep apnea (adult) (pediatric): Secondary | ICD-10-CM | POA: Diagnosis not present

## 2021-10-23 ENCOUNTER — Other Ambulatory Visit: Payer: Self-pay | Admitting: Neurology

## 2021-10-23 DIAGNOSIS — G4701 Insomnia due to medical condition: Secondary | ICD-10-CM

## 2021-10-24 MED ORDER — ZOLPIDEM TARTRATE 5 MG PO TABS: 5.0000 mg | ORAL_TABLET | Freq: Every evening | ORAL | 0 refills | Status: DC | PRN

## 2021-10-24 NOTE — Telephone Encounter (Signed)
Last OV was on 09/26/21.  ?Next OV is pending to be scheduled.  ?Last RX was written on 09/28/21 for 30 tabs.  ? ? Drug Database has been reviewed.  ?

## 2021-11-06 DIAGNOSIS — G4733 Obstructive sleep apnea (adult) (pediatric): Secondary | ICD-10-CM | POA: Diagnosis not present

## 2021-11-28 ENCOUNTER — Other Ambulatory Visit: Payer: Self-pay | Admitting: Nurse Practitioner

## 2021-11-28 DIAGNOSIS — G4700 Insomnia, unspecified: Secondary | ICD-10-CM

## 2021-11-28 DIAGNOSIS — G4726 Circadian rhythm sleep disorder, shift work type: Secondary | ICD-10-CM

## 2021-12-06 ENCOUNTER — Other Ambulatory Visit: Payer: Self-pay | Admitting: Nurse Practitioner

## 2021-12-06 DIAGNOSIS — I10 Essential (primary) hypertension: Secondary | ICD-10-CM

## 2021-12-07 DIAGNOSIS — G4733 Obstructive sleep apnea (adult) (pediatric): Secondary | ICD-10-CM | POA: Diagnosis not present

## 2021-12-12 NOTE — Telephone Encounter (Signed)
Chart supports rx refill ?Last ov: 07/27/2021 ?Last refill: 10/06/2021 ?

## 2021-12-22 ENCOUNTER — Other Ambulatory Visit: Payer: Self-pay | Admitting: Nurse Practitioner

## 2021-12-22 DIAGNOSIS — Z1231 Encounter for screening mammogram for malignant neoplasm of breast: Secondary | ICD-10-CM

## 2021-12-25 ENCOUNTER — Ambulatory Visit
Admission: RE | Admit: 2021-12-25 | Discharge: 2021-12-25 | Disposition: A | Discharge status: Home / Self Care | Payer: PPO | Source: Ambulatory Visit | Attending: Nurse Practitioner | Admitting: Nurse Practitioner

## 2021-12-25 ENCOUNTER — Ambulatory Visit: Payer: PPO

## 2021-12-25 DIAGNOSIS — Z1231 Encounter for screening mammogram for malignant neoplasm of breast: Secondary | ICD-10-CM | POA: Diagnosis not present

## 2021-12-27 NOTE — Telephone Encounter (Signed)
Please request results

## 2021-12-27 NOTE — Telephone Encounter (Signed)
Pt has received mammogram on 12/25/2021, results in chart.  ?

## 2021-12-29 ENCOUNTER — Other Ambulatory Visit: Payer: Self-pay | Admitting: Neurology

## 2021-12-29 DIAGNOSIS — G4701 Insomnia due to medical condition: Secondary | ICD-10-CM

## 2021-12-30 MED ORDER — ZOLPIDEM TARTRATE 5 MG PO TABS: 5.0000 mg | ORAL_TABLET | Freq: Every evening | ORAL | 0 refills | Status: DC | PRN

## 2022-01-01 DIAGNOSIS — M25561 Pain in right knee: Secondary | ICD-10-CM | POA: Diagnosis not present

## 2022-01-01 DIAGNOSIS — M170 Bilateral primary osteoarthritis of knee: Secondary | ICD-10-CM | POA: Diagnosis not present

## 2022-01-01 DIAGNOSIS — M25562 Pain in left knee: Secondary | ICD-10-CM | POA: Diagnosis not present

## 2022-01-11 ENCOUNTER — Telehealth (INDEPENDENT_AMBULATORY_CARE_PROVIDER_SITE_OTHER): Payer: PPO | Admitting: Family Medicine

## 2022-01-11 VITALS — Temp 102.0°F | Ht 67.0 in | Wt 249.0 lb

## 2022-01-11 DIAGNOSIS — U071 COVID-19: Secondary | ICD-10-CM

## 2022-01-11 MED ORDER — NIRMATRELVIR/RITONAVIR (PAXLOVID)TABLET: 3.0000 | ORAL_TABLET | Freq: Two times a day (BID) | ORAL | 0 refills | Status: AC

## 2022-01-11 NOTE — Progress Notes (Signed)
[facility] [address] [place] [address] Dept: [phone] Dept Fax: [phone]  Virtual Video Visit  I connected with Catherine Baker on 01/11/22 at 10:00 AM EDT by a video enabled telemedicine application and verified that I am speaking with the correct person using two identifiers.  Location patient: Ambulance person provider: Clinic Persons participating in the virtual visit: Patient, Provider  I discussed the limitations of evaluation and management by telemedicine and the availability of in person appointments. The patient expressed understanding and agreed to proceed.  Chief Complaint  Patient presents with   Acute Visit    C/o ST, body aches, HA x 2 days.  tested positive for covid today.  She has been taken Nyquil with little relief.      SUBJECTIVE:  HPI: Catherine Baker is a 71 y.o. female who presents with a 2-day history of sore throat, body aches, headache, and fever. Her temperature this morning was 102 F. She did try to go to work. At work, they performed a COVID test, which was reportedly positive. Catherine Baker has had COVID twice in the past. She did have the primary COVID vaccines, but no boosters.  Patient Active Problem List   Diagnosis Date Noted   OSA (obstructive sleep apnea) 07/27/2021   Asymptomatic age-related postmenopausal state 05/01/2019   Osteoarthritis of right knee 01/19/2019   Tear of medial meniscus of knee 01/19/2019   Mixed hyperlipidemia 10/03/2018   Muscle spasm of back 03/28/2016   Essential hypertension 10/22/2014   Hypothyroidism 10/22/2014   Insomnia 10/22/2014   Type 2 diabetes mellitus (HCC) 10/22/2014   Past Surgical History:  Procedure Laterality Date   ABDOMINAL HYSTERECTOMY     TUBAL LIGATION     Family History  Problem Relation Age of Onset   Hypertension Mother    Heart disease Mother    Hyperlipidemia Mother    Hypertension Father    Heart disease Father     Hyperlipidemia Father    Breast cancer Maternal Grandmother    Social History   Tobacco Use   Smoking status: Former    Packs/day: 2.00    Years: 11.00    Pack years: 22.00    Types: Cigarettes    Quit date: 1990    Years since quitting: 33.4   Smokeless tobacco: Never  Vaping Use   Vaping Use: Never used  Substance Use Topics   Alcohol use: Yes    Comment: occasional   Drug use: No    Current Outpatient Medications:    b complex vitamins tablet, Take 1 tablet by mouth daily., Disp: , Rfl:    ibuprofen (ADVIL) 600 MG tablet, Take 1 tablet (600 mg total) by mouth every 8 (eight) hours as needed. TAKE 1 TABLET BY MOUTH EVERY 8 HOURS AS NEEDED WITH FOOD, Disp: 30 tablet, Rfl: 0   nirmatrelvir/ritonavir EUA (PAXLOVID) 20 x 150 MG & 10 x 100MG  TABS, Take 3 tablets by mouth 2 (two) times daily for 5 days. (Take nirmatrelvir 150 mg two tablets twice daily for 5 days and ritonavir 100 mg one tablet twice daily for 5 days) Patient GFR is 61, Disp: 30 tablet, Rfl: 0   telmisartan-hydrochlorothiazide (MICARDIS HCT) 80-25 MG tablet, Take 1 tablet by mouth once daily, Disp: 30 tablet, Rfl: 0   zolpidem (AMBIEN) 5 MG tablet, Take 1 tablet (5 mg total) by mouth at bedtime as needed. for sleep, Disp: 30 tablet, Rfl: 0  Allergies  Allergen Reactions  Codeine Hives, Nausea Only and Nausea And Vomiting   Crestor [Rosuvastatin Calcium] Other (See Comments)    Myalgia and headache   ROS: See pertinent positives and negatives per HPI.  OBSERVATIONS/OBJECTIVE:  VITALS per patient if applicable: Today's Vitals   01/11/22 0959  Temp: (!) 102 F (38.9 C)  TempSrc: Temporal  Weight: 249 lb (112.9 kg)  Height: 5\' 7"  (1.702 m)   Body mass index is 39 kg/m.    GENERAL: Alert and oriented. Appears well and in no acute distress.  HEENT: Atraumatic. Eyes clear. No obvious abnormalities on inspection of external nose and ears.  NECK: Normal movements of the head and neck. Hoarseness  noted.  LUNGS: On inspection, no signs of respiratory distress. Breathing rate appears normal. No obvious gross SOB, gasping or wheezing, and no conversational dyspnea.  CV: No obvious cyanosis.  PSYCH/NEURO: Pleasant and cooperative. No obvious depression or anxiety. Speech and thought processing grossly intact.  ASSESSMENT AND PLAN:  1. COVID-19 Reviewed home care instructions for COVID. Discussed home care for viral illness, including rest, pushing fluids, and OTC medications as needed for symptom relief. Recommend hot tea with honey for sore throat symptoms.  Advised self-isolation at home for at least 5 days. After 5 days, if improved and fever resolved, can be in public, but should wear a mask around others for an additional 5 days. If symptoms, esp, dyspnea develops/worsens, recommend in-person evaluation at either an urgent care or the emergency room. Follow-up if needed for worsening or persistent symptoms.  - nirmatrelvir/ritonavir EUA (PAXLOVID) 20 x 150 MG & 10 x 100MG  TABS; Take 3 tablets by mouth 2 (two) times daily for 5 days. (Take nirmatrelvir 150 mg two tablets twice daily for 5 days and ritonavir 100 mg one tablet twice daily for 5 days) Patient GFR is 61  Dispense: 30 tablet; Refill: 0   I discussed the assessment and treatment plan with the patient. The patient was provided an opportunity to ask questions and all were answered. The patient agreed with the plan and demonstrated an understanding of the instructions.   The patient was advised to call back or seek an in-person evaluation if the symptoms worsen or if the condition fails to improve as anticipated.  Return if symptoms worsen or fail to improve.   , MD

## 2022-02-01 ENCOUNTER — Other Ambulatory Visit: Payer: Self-pay | Admitting: Nurse Practitioner

## 2022-02-01 ENCOUNTER — Other Ambulatory Visit: Payer: Self-pay | Admitting: Neurology

## 2022-02-01 DIAGNOSIS — I10 Essential (primary) hypertension: Secondary | ICD-10-CM

## 2022-02-01 DIAGNOSIS — G4701 Insomnia due to medical condition: Secondary | ICD-10-CM

## 2022-02-02 ENCOUNTER — Other Ambulatory Visit: Payer: Self-pay | Admitting: Neurology

## 2022-02-02 DIAGNOSIS — G4701 Insomnia due to medical condition: Secondary | ICD-10-CM

## 2022-02-02 MED ORDER — ZOLPIDEM TARTRATE 5 MG PO TABS: 5.0000 mg | ORAL_TABLET | Freq: Every evening | ORAL | 2 refills | Status: DC | PRN

## 2022-02-02 MED ORDER — TELMISARTAN-HCTZ 80-25 MG PO TABS: 1.0000 | ORAL_TABLET | Freq: Every day | ORAL | 0 refills | Status: DC

## 2022-02-02 NOTE — Telephone Encounter (Signed)
Chart Supports Rx Last OV: 07/2021 Next OV: 08/2022

## 2022-02-02 NOTE — Telephone Encounter (Signed)
Already filled

## 2022-02-06 DIAGNOSIS — G4733 Obstructive sleep apnea (adult) (pediatric): Secondary | ICD-10-CM | POA: Diagnosis not present

## 2022-04-06 NOTE — Telephone Encounter (Signed)
Na

## 2022-04-29 ENCOUNTER — Other Ambulatory Visit: Payer: Self-pay | Admitting: Neurology

## 2022-04-29 DIAGNOSIS — G4701 Insomnia due to medical condition: Secondary | ICD-10-CM

## 2022-05-01 ENCOUNTER — Encounter: Payer: Self-pay | Admitting: Nurse Practitioner

## 2022-05-04 ENCOUNTER — Encounter: Payer: Self-pay | Admitting: Neurology

## 2022-05-07 ENCOUNTER — Other Ambulatory Visit: Payer: Self-pay | Admitting: Neurology

## 2022-05-07 DIAGNOSIS — G4701 Insomnia due to medical condition: Secondary | ICD-10-CM

## 2022-05-07 MED ORDER — ZOLPIDEM TARTRATE 5 MG PO TABS: 5.0000 mg | ORAL_TABLET | Freq: Every evening | ORAL | 3 refills | Status: DC | PRN

## 2022-06-13 ENCOUNTER — Telehealth: Payer: Self-pay

## 2022-06-13 NOTE — Telephone Encounter (Signed)
Pt overdue for TELMISARTAN-HYDROCHLOROTHIAZIDE refill. Pt needs to come in for OV to have this refilled. LOV 07/27/2021. Pt states she will call the office when she gets off work to schedule. Instructed to call with questions or concerns.

## 2022-07-04 ENCOUNTER — Other Ambulatory Visit: Payer: Self-pay | Admitting: Nurse Practitioner

## 2022-07-04 DIAGNOSIS — I10 Essential (primary) hypertension: Secondary | ICD-10-CM

## 2022-07-04 NOTE — Telephone Encounter (Signed)
Chart supports Rx Last OV: VV 01/2022 Next OV: 08/2022

## 2022-07-11 DIAGNOSIS — G4733 Obstructive sleep apnea (adult) (pediatric): Secondary | ICD-10-CM | POA: Diagnosis not present

## 2022-08-13 ENCOUNTER — Telehealth: Payer: PPO | Admitting: Physician Assistant

## 2022-08-13 DIAGNOSIS — B9689 Other specified bacterial agents as the cause of diseases classified elsewhere: Secondary | ICD-10-CM

## 2022-08-13 DIAGNOSIS — J208 Acute bronchitis due to other specified organisms: Secondary | ICD-10-CM

## 2022-08-13 MED ORDER — DOXYCYCLINE HYCLATE 100 MG PO TABS: 100.0000 mg | ORAL_TABLET | Freq: Two times a day (BID) | ORAL | 0 refills | Status: DC

## 2022-08-13 MED ORDER — ALBUTEROL SULFATE HFA 108 (90 BASE) MCG/ACT IN AERS: 2.0000 | INHALATION_SPRAY | Freq: Four times a day (QID) | RESPIRATORY_TRACT | 0 refills | Status: DC | PRN

## 2022-08-13 MED ORDER — BENZONATATE 100 MG PO CAPS: 100.0000 mg | ORAL_CAPSULE | Freq: Three times a day (TID) | ORAL | 0 refills | Status: DC | PRN

## 2022-08-13 NOTE — Patient Instructions (Signed)
Catherine Baker, thank you for joining Leeanne Rio, PA-C for today's virtual visit.  While this provider is not your primary care provider (PCP), if your PCP is located in our provider database this encounter information will be shared with them immediately following your visit.   [facility] account gives you access to today's visit and all your visits, tests, and labs performed at [facility] " click here if you don't have a [facility] account or go to [email or web address]  Consent: (Patient) Catherine Baker provided verbal consent for this virtual visit at the beginning of the encounter.  Current Medications:  Current Outpatient Medications:    b complex vitamins tablet, Take 1 tablet by mouth daily., Disp: , Rfl:    ibuprofen (ADVIL) 600 MG tablet, Take 1 tablet (600 mg total) by mouth every 8 (eight) hours as needed. TAKE 1 TABLET BY MOUTH EVERY 8 HOURS AS NEEDED WITH FOOD, Disp: 30 tablet, Rfl: 0   telmisartan-hydrochlorothiazide (MICARDIS HCT) 80-25 MG tablet, Take 1 tablet by mouth once daily, Disp: 90 tablet, Rfl: 0   zolpidem (AMBIEN) 5 MG tablet, Take 1 tablet (5 mg total) by mouth at bedtime as needed. for sleep, Disp: 30 tablet, Rfl: 3   Medications ordered in this encounter:  No orders of the defined types were placed in this encounter.    *If you need refills on other medications prior to your next appointment, please contact your pharmacy*  Follow-Up: Call back or seek an in-person evaluation if the symptoms worsen or if the condition fails to improve as anticipated.  [place] [phone]  Other Instructions Take antibiotic (Doxycycline) as directed.  Increase fluids.  Get plenty of rest. Use Mucinex for congestion. Use Tessalon and albuterol as directed. Take a daily probiotic (I recommend Align or Culturelle, but even Activia Yogurt may be beneficial).  A humidifier placed in the bedroom may offer some  relief for a dry, scratchy throat of nasal irritation.  Read information below on acute bronchitis. Please call or return to clinic if symptoms are not improving.  Acute Bronchitis Bronchitis is when the airways that extend from the windpipe into the lungs get red, puffy, and painful (inflamed). Bronchitis often causes thick spit (mucus) to develop. This leads to a cough. A cough is the most common symptom of bronchitis. In acute bronchitis, the condition usually begins suddenly and goes away over time (usually in 2 weeks). Smoking, allergies, and asthma can make bronchitis worse. Repeated episodes of bronchitis may cause more lung problems.  HOME CARE Rest. Drink enough fluids to keep your pee (urine) clear or pale yellow (unless you need to limit fluids as told by your doctor). Only take over-the-counter or prescription medicines as told by your doctor. Avoid smoking and secondhand smoke. These can make bronchitis worse. If you are a smoker, think about using nicotine gum or skin patches. Quitting smoking will help your lungs heal faster. Reduce the chance of getting bronchitis again by: Washing your hands often. Avoiding people with cold symptoms. Trying not to touch your hands to your mouth, nose, or eyes. Follow up with your doctor as told.  GET HELP IF: Your symptoms do not improve after 1 week of treatment. Symptoms include: Cough. Fever. Coughing up thick spit. Body aches. Chest congestion. Chills. Shortness of breath. Sore throat.  GET HELP RIGHT AWAY IF:  You have an increased fever. You have chills. You have severe shortness of breath. You have bloody  thick spit (sputum). You throw up (vomit) often. You lose too much body fluid (dehydration). You have a severe headache. You faint.  MAKE SURE YOU:  Understand these instructions. Will watch your condition. Will get help right away if you are not doing well or get worse. Document Released: 01/16/2008 Document  Revised: 04/01/2013 Document Reviewed: 01/20/2013 [employer] Patient Information 2015 [place], [place]. This information is not intended to replace advice given to you by your health care provider. Make sure you discuss any questions you have with your health care provider.    If you have been instructed to have an in-person evaluation today at a local Urgent Care facility, please use the link below. It will take you to a list of all of our available [facility], including address, phone number and hours of operation. Please do not delay care.  [facility]  If you or a family member do not have a primary care provider, use the link below to schedule a visit and establish care. When you choose a [facility] primary care physician or advanced practice provider, you gain a long-term partner in health. Find a Primary Care Provider  Learn more about [facility]'s in-office and virtual care options: [facility]

## 2022-08-13 NOTE — Progress Notes (Signed)
Virtual Visit Consent   Catherine Baker, you are scheduled for a virtual visit with a [facility] provider today. Just as with appointments in the office, your consent must be obtained to participate. Your consent will be active for this visit and any virtual visit you may have with one of our providers in the next 365 days. If you have a MyChart account, a copy of this consent can be sent to you electronically.  As this is a virtual visit, video technology does not allow for your provider to perform a traditional examination. This may limit your provider's ability to fully assess your condition. If your provider identifies any concerns that need to be evaluated in person or the need to arrange testing (such as labs, EKG, etc.), we will make arrangements to do so. Although advances in technology are sophisticated, we cannot ensure that it will always work on either your end or our end. If the connection with a video visit is poor, the visit may have to be switched to a telephone visit. With either a video or telephone visit, we are not always able to ensure that we have a secure connection.  By engaging in this virtual visit, you consent to the provision of healthcare and authorize for your insurance to be billed (if applicable) for the services provided during this visit. Depending on your insurance coverage, you may receive a charge related to this service.  I need to obtain your verbal consent now. Are you willing to proceed with your visit today? Catherine Baker has provided verbal consent on 08/13/2022 for a virtual visit (video or telephone). Leeanne Rio, [place]  Date: 08/13/2022 7:05 PM  Virtual Visit via Video Note   I, Leeanne Rio, connected with  Catherine Baker  ([id number], 12-07-1950) on 08/13/22 at  7:00 PM EST by a video-enabled telemedicine application and verified that I am speaking with the correct person using two identifiers.  Location: Patient: Virtual Visit  Location Patient: Home Provider: Virtual Visit Location Provider: Office/Clinic   I discussed the limitations of evaluation and management by telemedicine and the availability of in person appointments. The patient expressed understanding and agreed to proceed.    History of Present Illness: Catherine Baker is a 72 y.o. who identifies as a female who was assigned female at birth, and is being seen today for > 1 week of progressively worsening URI symptoms. Initially thought flu or COVID. Was COVID negative. Cough is dry but persistent -- chest wall tenderness and heaviness. Occasional yellow phlegm. SOB with exertion.   OTC medications -- Nyquil/Dayquil, Tylenol Arthritis, Vicks Vapor Rub.[place] Kitchen   HPI: HPI  Problems:  Patient Active Problem List   Diagnosis Date Noted   OSA (obstructive sleep apnea) 07/27/2021   Asymptomatic age-related postmenopausal state 05/01/2019   Osteoarthritis of right knee 01/19/2019   Tear of medial meniscus of knee 01/19/2019   Mixed hyperlipidemia 10/03/2018   Muscle spasm of back 03/28/2016   Essential hypertension 10/22/2014   Hypothyroidism 10/22/2014   Insomnia 10/22/2014   Type 2 diabetes mellitus ([place]) 10/22/2014    Allergies:  Allergies  Allergen Reactions   Codeine Hives, Nausea Only and Nausea And Vomiting   Crestor [Rosuvastatin Calcium] Other (See Comments)    Myalgia and headache   Medications:  Current Outpatient Medications:    albuterol (VENTOLIN HFA) 108 (90 Base) MCG/ACT inhaler, Inhale 2 puffs into the lungs every 6 (six) hours as needed for wheezing or shortness of breath., Disp:  8 g, Rfl: 0   benzonatate (TESSALON) 100 MG capsule, Take 1 capsule (100 mg total) by mouth 3 (three) times daily as needed for cough., Disp: 30 capsule, Rfl: 0   doxycycline (VIBRA-TABS) 100 MG tablet, Take 1 tablet (100 mg total) by mouth 2 (two) times daily., Disp: 14 tablet, Rfl: 0   b complex vitamins tablet, Take 1 tablet by mouth daily., Disp: , Rfl:     ibuprofen (ADVIL) 600 MG tablet, Take 1 tablet (600 mg total) by mouth every 8 (eight) hours as needed. TAKE 1 TABLET BY MOUTH EVERY 8 HOURS AS NEEDED WITH FOOD, Disp: 30 tablet, Rfl: 0   telmisartan-hydrochlorothiazide (MICARDIS HCT) 80-25 MG tablet, Take 1 tablet by mouth once daily, Disp: 90 tablet, Rfl: 0   zolpidem (AMBIEN) 5 MG tablet, Take 1 tablet (5 mg total) by mouth at bedtime as needed. for sleep, Disp: 30 tablet, Rfl: 3  Observations/Objective: Patient is well-developed, well-nourished in no acute distress.  Resting comfortably at home.  Head is normocephalic, atraumatic.  No labored breathing. Speech is clear and coherent with logical content.  Patient is alert and oriented at baseline.   Assessment and Plan: 1. Acute bacterial bronchitis - benzonatate (TESSALON) 100 MG capsule; Take 1 capsule (100 mg total) by mouth 3 (three) times daily as needed for cough.  Dispense: 30 capsule; Refill: 0 - albuterol (VENTOLIN HFA) 108 (90 Base) MCG/ACT inhaler; Inhale 2 puffs into the lungs every 6 (six) hours as needed for wheezing or shortness of breath.  Dispense: 8 g; Refill: 0 - doxycycline (VIBRA-TABS) 100 MG tablet; Take 1 tablet (100 mg total) by mouth 2 (two) times daily.  Dispense: 14 tablet; Refill: 0  Rx Doxycycline.  Increase fluids.  Rest.  Saline nasal spray.  Probiotic.  Mucinex as directed.  Humidifier in bedroom. Tessalon per orders Albuterol as directed.  Call or return to clinic if symptoms are not improving.   Follow Up Instructions: I discussed the assessment and treatment plan with the patient. The patient was provided an opportunity to ask questions and all were answered. The patient agreed with the plan and demonstrated an understanding of the instructions.  A copy of instructions were sent to the patient via MyChart unless otherwise noted below.   The patient was advised to call back or seek an in-person evaluation if the symptoms worsen or if the condition fails  to improve as anticipated.  Time:  I spent 10 minutes with the patient via telehealth technology discussing the above problems/concerns.    Leeanne Rio, PA-C

## 2022-08-15 IMAGING — MG MM DIGITAL SCREENING BILAT W/ TOMO AND CAD
8 series · 8 of 24 positions shown · non-contrast
Comparison: Previous exam(s).

CLINICAL DATA: Screening.

EXAM:
DIGITAL SCREENING BILATERAL MAMMOGRAM WITH TOMOSYNTHESIS AND CAD
TECHNIQUE: Bilateral screening digital craniocaudal and mediolateral oblique
mammograms were obtained. Bilateral screening digital breast
tomosynthesis was performed. The images were evaluated with
computer-aided detection.

[L CC synth-2D]
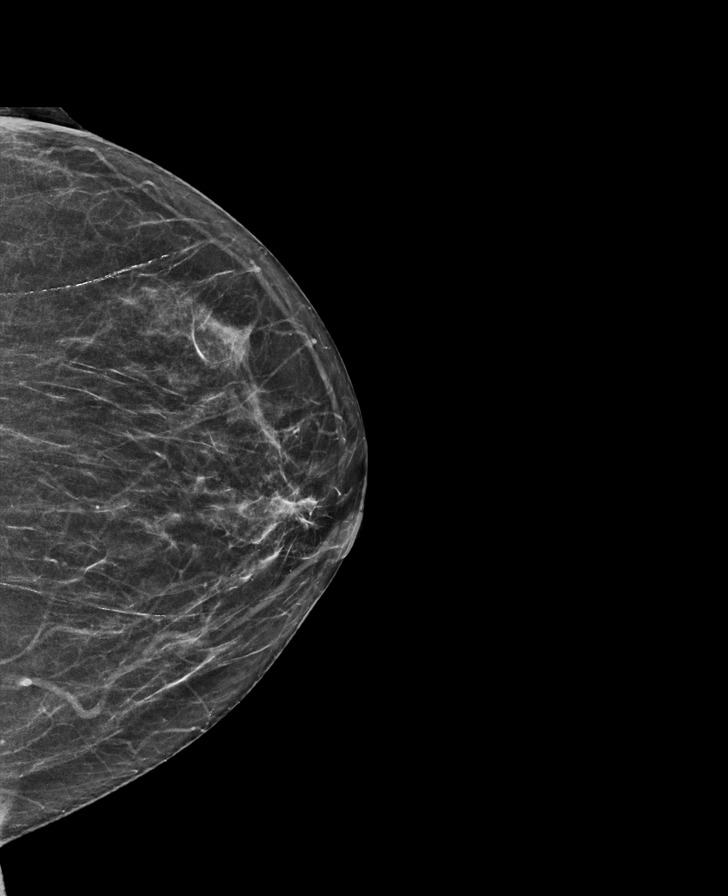

[R CC synth-2D]
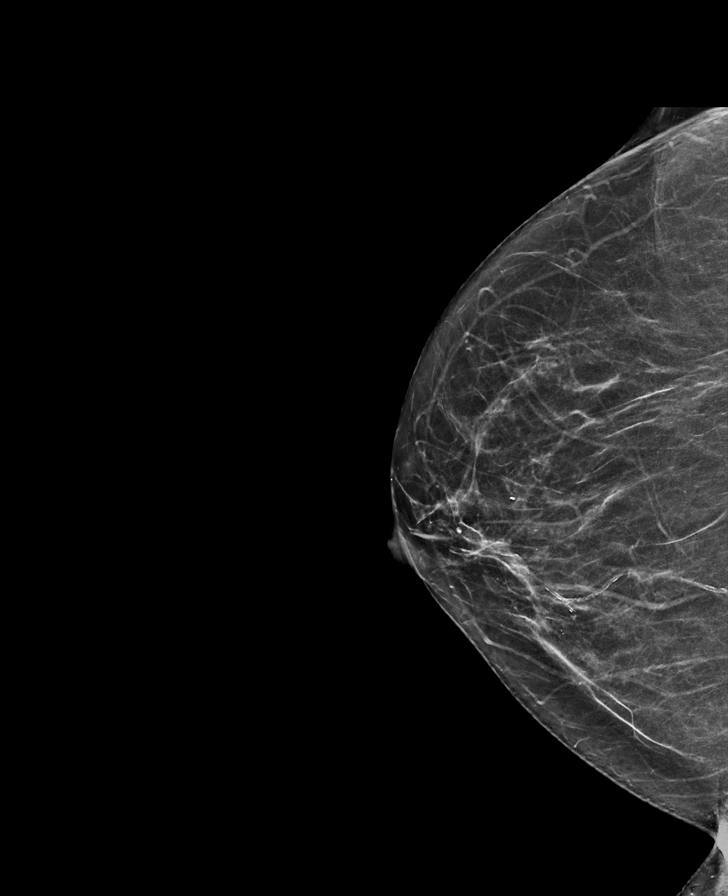

[R MLO synth-2D]
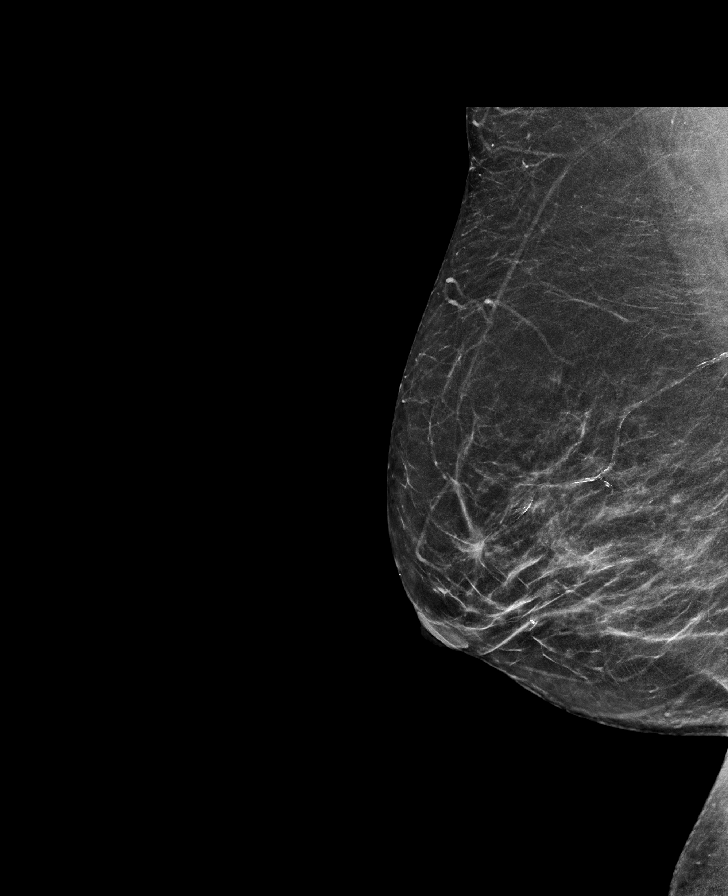

[L MLO synth-2D]
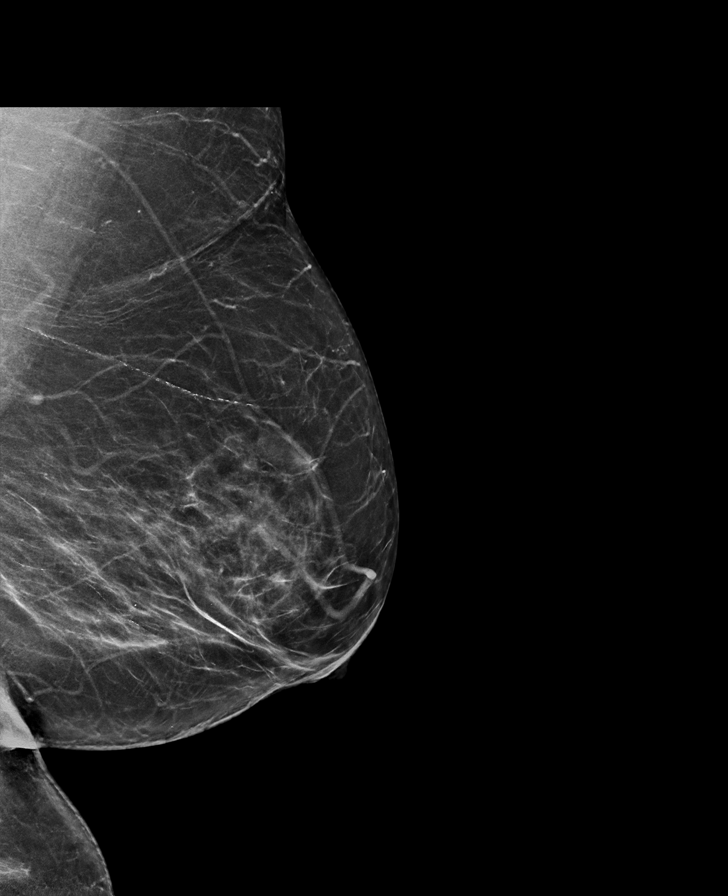

[R MLO tomo · tomo slice 30/59.0]
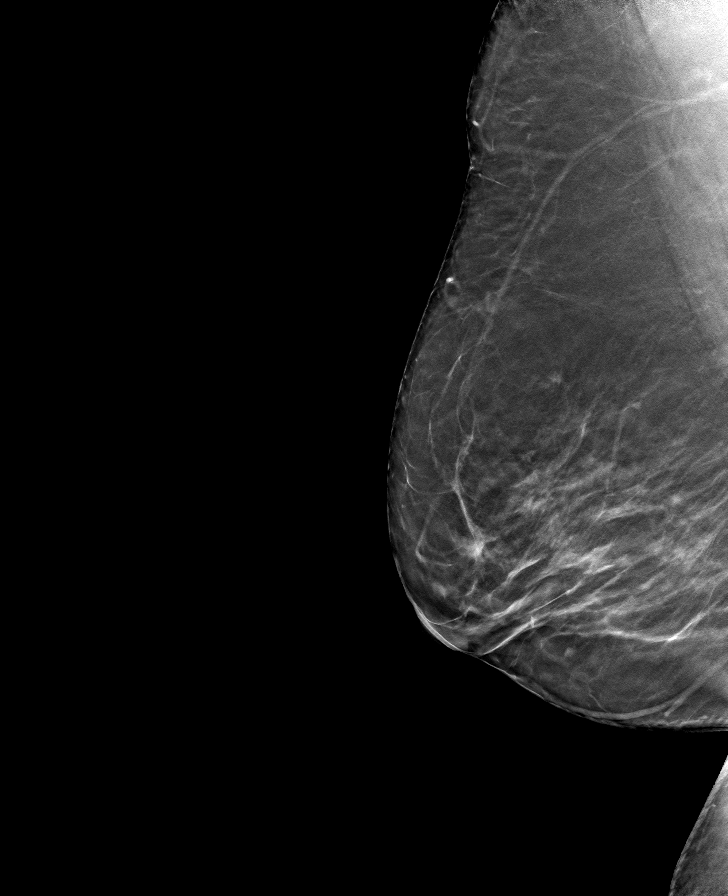

[L CC tomo · tomo slice 29/56.0]
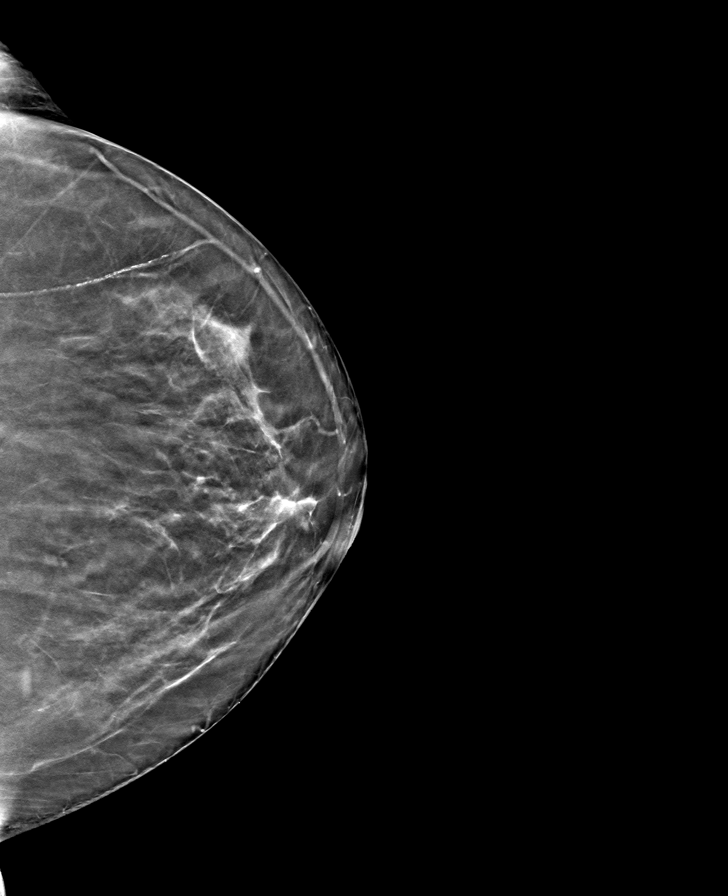

[R CC tomo · tomo slice 29/57.0]
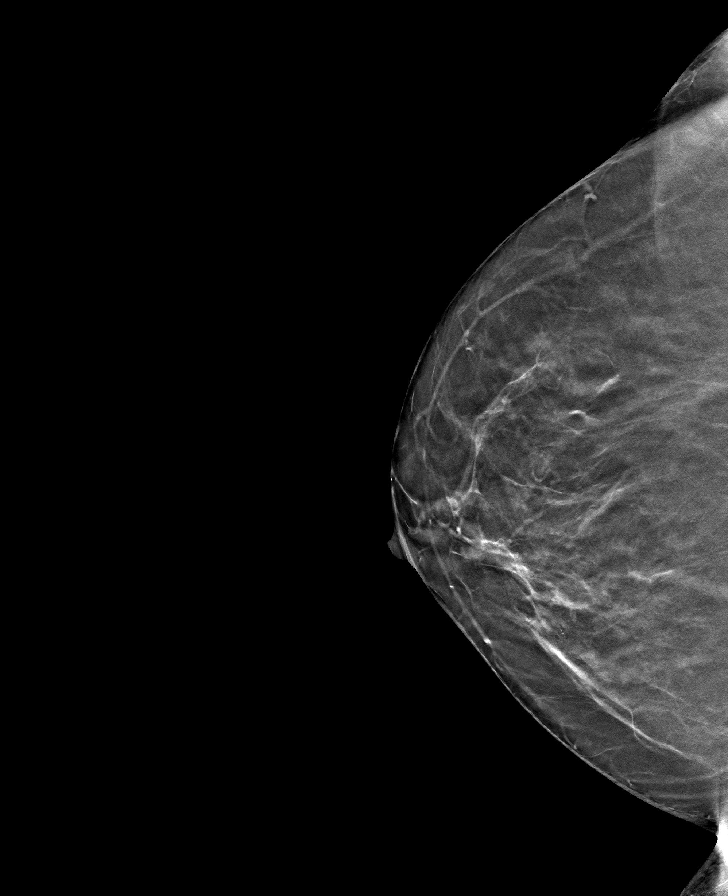

[L MLO tomo · tomo slice 34/67.0]
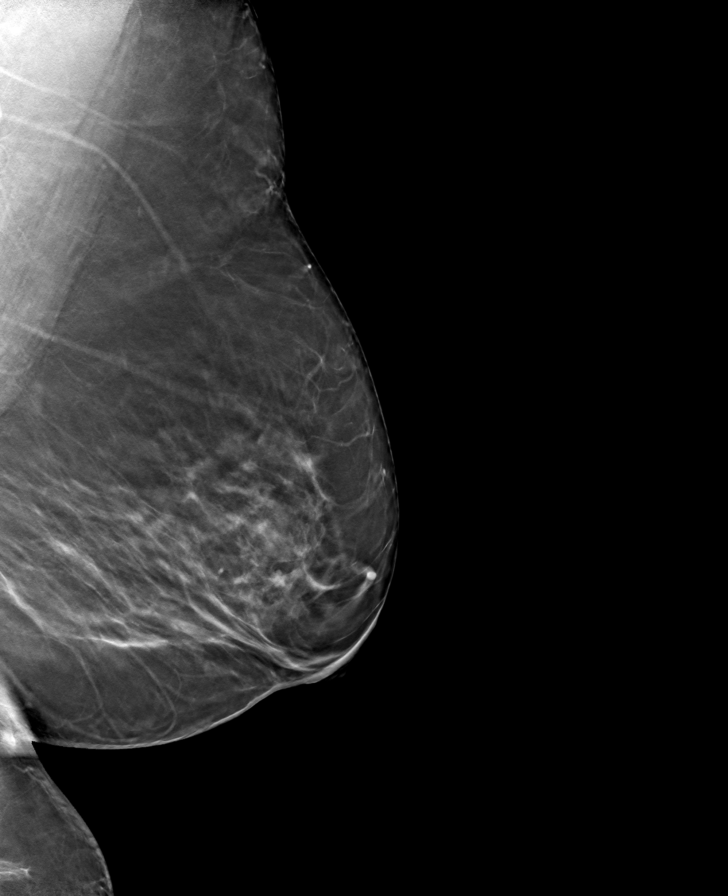

[8 of 24 positions shown; findings below may reference images not displayed]

ACR Breast Density Category b: There are scattered areas of
fibroglandular density.
FINDINGS: There are no findings suspicious for malignancy.
IMPRESSION: No mammographic evidence of malignancy. A result letter of this
screening mammogram will be mailed directly to the patient.

RECOMMENDATION:
Screening mammogram in one year. (Code:51-O-LD2)

BI-RADS CATEGORY  1: Negative.

## 2022-09-06 ENCOUNTER — Other Ambulatory Visit: Payer: Self-pay | Admitting: Neurology

## 2022-09-06 DIAGNOSIS — G4701 Insomnia due to medical condition: Secondary | ICD-10-CM

## 2022-09-09 ENCOUNTER — Other Ambulatory Visit: Payer: Self-pay | Admitting: Nurse Practitioner

## 2022-09-09 DIAGNOSIS — I10 Essential (primary) hypertension: Secondary | ICD-10-CM

## 2022-09-10 NOTE — Telephone Encounter (Signed)
Chart supports Rx Last OV: 01/2022 Next OV: 09/2022

## 2022-09-17 DIAGNOSIS — M79644 Pain in right finger(s): Secondary | ICD-10-CM | POA: Diagnosis not present

## 2022-09-17 DIAGNOSIS — M170 Bilateral primary osteoarthritis of knee: Secondary | ICD-10-CM | POA: Diagnosis not present

## 2022-09-24 ENCOUNTER — Ambulatory Visit (INDEPENDENT_AMBULATORY_CARE_PROVIDER_SITE_OTHER): Payer: PPO

## 2022-09-24 VITALS — Ht 67.0 in | Wt 250.0 lb

## 2022-09-24 DIAGNOSIS — Z0000 Encounter for general adult medical examination without abnormal findings: Secondary | ICD-10-CM | POA: Diagnosis not present

## 2022-09-24 NOTE — Patient Instructions (Addendum)
[name] , Thank you for taking time to come for your Medicare Wellness Visit. I appreciate your ongoing commitment to your health goals. Please review the following plan we discussed and let me know if I can assist you in the future.   These are the goals we discussed:  Goals      Exercise 3x per week (30 min per time)     Patient Stated     09/24/2022, stay healthy and increase exercise        This is a list of the screening recommended for you and due dates:  Health Maintenance  Topic Date Due   DTaP/Tdap/Td vaccine (1 - Tdap) Never done   Colon Cancer Screening  Never done   Zoster (Shingles) Vaccine (1 of 2) Never done   Eye exam for diabetics  09/13/2016   Pneumonia Vaccine (1 of 1 - PCV) Never done   Yearly kidney health urinalysis for diabetes  10/03/2019   Complete foot exam   04/30/2020   Hemoglobin A1C  12/01/2021   Flu Shot  03/13/2022   COVID-19 Vaccine (3 - 2023-24 season) 04/13/2022   Yearly kidney function blood test for diabetes  06/02/2022   Mammogram  12/26/2022   Medicare Annual Wellness Visit  09/25/2023   DEXA scan (bone density measurement)  Completed   Hepatitis C Screening: USPSTF Recommendation to screen - Ages 3-79 yo.  Completed   HPV Vaccine  Aged Out    Advanced directives: Advance directive discussed with you today.   Conditions/risks identified: none  Next appointment: Follow up in one year for your annual wellness visit    Preventive Care 65 Years and Older, Female Preventive care refers to lifestyle choices and visits with your health care provider that can promote health and wellness. What does preventive care include? A yearly physical exam. This is also called an annual well check. Dental exams once or twice a year. Routine eye exams. Ask your health care provider how often you should have your eyes checked. Personal lifestyle choices, including: Daily care of your teeth and gums. Regular physical activity. Eating a healthy  diet. Avoiding tobacco and drug use. Limiting alcohol use. Practicing safe sex. Taking low-dose aspirin every day. Taking vitamin and mineral supplements as recommended by your health care provider. What happens during an annual well check? The services and screenings done by your health care provider during your annual well check will depend on your age, overall health, lifestyle risk factors, and family history of disease. Counseling  Your health care provider may ask you questions about your: Alcohol use. Tobacco use. Drug use. Emotional well-being. Home and relationship well-being. Sexual activity. Eating habits. History of falls. Memory and ability to understand (cognition). Work and work Statistician. Reproductive health. Screening  You may have the following tests or measurements: Height, weight, and BMI. Blood pressure. Lipid and cholesterol levels. These may be checked every 5 years, or more frequently if you are over 23 years old. Skin check. Lung cancer screening. You may have this screening every year starting at age 53 if you have a 30-pack-year history of smoking and currently smoke or have quit within the past 15 years. Fecal occult blood test (FOBT) of the stool. You may have this test every year starting at age 70. Flexible sigmoidoscopy or colonoscopy. You may have a sigmoidoscopy every 5 years or a colonoscopy every 10 years starting at age 52. Hepatitis C blood test. Hepatitis B blood test. Sexually transmitted disease (STD) testing.  Diabetes screening. This is done by checking your blood sugar (glucose) after you have not eaten for a while (fasting). You may have this done every 1-3 years. Bone density scan. This is done to screen for osteoporosis. You may have this done starting at age 61. Mammogram. This may be done every 1-2 years. Talk to your health care provider about how often you should have regular mammograms. Talk with your health care provider about  your test results, treatment options, and if necessary, the need for more tests. Vaccines  Your health care provider may recommend certain vaccines, such as: Influenza vaccine. This is recommended every year. Tetanus, diphtheria, and acellular pertussis (Tdap, Td) vaccine. You may need a Td booster every 10 years. Zoster vaccine. You may need this after age 62. Pneumococcal 13-valent conjugate (PCV13) vaccine. One dose is recommended after age 42. Pneumococcal polysaccharide (PPSV23) vaccine. One dose is recommended after age 46. Talk to your health care provider about which screenings and vaccines you need and how often you need them. This information is not intended to replace advice given to you by your health care provider. Make sure you discuss any questions you have with your health care provider. Document Released: 08/26/2015 Document Revised: 04/18/2016 Document Reviewed: 05/31/2015 Elsevier Interactive Patient Education  2017 [employer] Prevention in the Home Falls can cause injuries. They can happen to people of all ages. There are many things you can do to make your home safe and to help prevent falls. What can I do on the outside of my home? Regularly fix the edges of walkways and driveways and fix any cracks. Remove anything that might make you trip as you walk through a door, such as a raised step or threshold. Trim any bushes or trees on the path to your home. Use bright outdoor lighting. Clear any walking paths of anything that might make someone trip, such as rocks or tools. Regularly check to see if handrails are loose or broken. Make sure that both sides of any steps have handrails. Any raised decks and porches should have guardrails on the edges. Have any leaves, snow, or ice cleared regularly. Use sand or salt on walking paths during winter. Clean up any spills in your garage right away. This includes oil or grease spills. What can I do in the bathroom? Use  night lights. Install grab bars by the toilet and in the tub and shower. Do not use towel bars as grab bars. Use non-skid mats or decals in the tub or shower. If you need to sit down in the shower, use a plastic, non-slip stool. Keep the floor dry. Clean up any water that spills on the floor as soon as it happens. Remove soap buildup in the tub or shower regularly. Attach bath mats securely with double-sided non-slip rug tape. Do not have throw rugs and other things on the floor that can make you trip. What can I do in the bedroom? Use night lights. Make sure that you have a light by your bed that is easy to reach. Do not use any sheets or blankets that are too big for your bed. They should not hang down onto the floor. Have a firm chair that has side arms. You can use this for support while you get dressed. Do not have throw rugs and other things on the floor that can make you trip. What can I do in the kitchen? Clean up any spills right away. Avoid walking on wet floors.  Keep items that you use a lot in easy-to-reach places. If you need to reach something above you, use a strong step stool that has a grab bar. Keep electrical cords out of the way. Do not use floor polish or wax that makes floors slippery. If you must use wax, use non-skid floor wax. Do not have throw rugs and other things on the floor that can make you trip. What can I do with my stairs? Do not leave any items on the stairs. Make sure that there are handrails on both sides of the stairs and use them. Fix handrails that are broken or loose. Make sure that handrails are as long as the stairways. Check any carpeting to make sure that it is firmly attached to the stairs. Fix any carpet that is loose or worn. Avoid having throw rugs at the top or bottom of the stairs. If you do have throw rugs, attach them to the floor with carpet tape. Make sure that you have a light switch at the top of the stairs and the bottom of the  stairs. If you do not have them, ask someone to add them for you. What else can I do to help prevent falls? Wear shoes that: Do not have high heels. Have rubber bottoms. Are comfortable and fit you well. Are closed at the toe. Do not wear sandals. If you use a stepladder: Make sure that it is fully opened. Do not climb a closed stepladder. Make sure that both sides of the stepladder are locked into place. Ask someone to hold it for you, if possible. Clearly mark and make sure that you can see: Any grab bars or handrails. First and last steps. Where the edge of each step is. Use tools that help you move around (mobility aids) if they are needed. These include: Canes. Walkers. Scooters. Crutches. Turn on the lights when you go into a dark area. Replace any light bulbs as soon as they burn out. Set up your furniture so you have a clear path. Avoid moving your furniture around. If any of your floors are uneven, fix them. If there are any pets around you, be aware of where they are. Review your medicines with your doctor. Some medicines can make you feel dizzy. This can increase your chance of falling. Ask your doctor what other things that you can do to help prevent falls. This information is not intended to replace advice given to you by your health care provider. Make sure you discuss any questions you have with your health care provider. Document Released: 05/26/2009 Document Revised: 01/05/2016 Document Reviewed: 09/03/2014 Elsevier Interactive Patient Education  2017 [employer].

## 2022-09-24 NOTE — Progress Notes (Signed)
I connected with  Catherine Baker on 09/24/22 by a audio enabled telemedicine application and verified that I am speaking with the correct person using two identifiers.  Patient Location: Home  Provider Location: Office/Clinic  I discussed the limitations of evaluation and management by telemedicine. The patient expressed understanding and agreed to proceed.  Subjective:   Catherine Baker is a 72 y.o. female who presents for Medicare Annual (Subsequent) preventive examination.  Review of Systems     Cardiac Risk Factors include: advanced age (>32mn, >>55women)     Objective:    Today's Vitals   09/24/22 1202  Weight: 250 lb (113.4 kg)  Height: 5' 7"$  (1.702 m)   Body mass index is 39.16 kg/m.     09/24/2022   12:07 PM 09/06/2021   12:08 PM 09/14/2018   12:14 AM 07/26/2015    7:46 PM  Advanced Directives  Does Patient Have a Medical Advance Directive? No No No No  Would patient like information on creating a medical advance directive?  No - Patient declined  No - patient declined information    Current Medications (verified) Outpatient Encounter Medications as of 09/24/2022  Medication Sig   b complex vitamins tablet Take 1 tablet by mouth daily.   ibuprofen (ADVIL) 600 MG tablet Take 1 tablet (600 mg total) by mouth every 8 (eight) hours as needed. TAKE 1 TABLET BY MOUTH EVERY 8 HOURS AS NEEDED WITH FOOD   telmisartan-hydrochlorothiazide (MICARDIS HCT) 80-25 MG tablet Take 1 tablet by mouth once daily   zolpidem (AMBIEN) 5 MG tablet TAKE 1 TABLET BY MOUTH AT BEDTIME AS NEEDED FOR SLEEP   albuterol (VENTOLIN HFA) 108 (90 Base) MCG/ACT inhaler Inhale 2 puffs into the lungs every 6 (six) hours as needed for wheezing or shortness of breath. (Patient not taking: Reported on 09/24/2022)   benzonatate (TESSALON) 100 MG capsule Take 1 capsule (100 mg total) by mouth 3 (three) times daily as needed for cough. (Patient not taking: Reported on 09/24/2022)   doxycycline (VIBRA-TABS)  100 MG tablet Take 1 tablet (100 mg total) by mouth 2 (two) times daily. (Patient not taking: Reported on 09/24/2022)   No facility-administered encounter medications on file as of 09/24/2022.    Allergies (verified) Codeine and Crestor [rosuvastatin calcium]   History: Past Medical History:  Diagnosis Date   Adjustment disorder with mixed anxiety and depressed mood 12/09/2019   Diabetes mellitus without complication (HCC)    Hypertension    Past Surgical History:  Procedure Laterality Date   ABDOMINAL HYSTERECTOMY     TUBAL LIGATION     Family History  Problem Relation Age of Onset   Hypertension Mother    Heart disease Mother    Hyperlipidemia Mother    Hypertension Father    Heart disease Father    Hyperlipidemia Father    Breast cancer Maternal Grandmother    Social History   Socioeconomic History   Marital status: Legally Separated    Spouse name: Not on file   Number of children: 3   Years of education: 14   Highest education level: Not on file  Occupational History   Occupation: Nurse  Tobacco Use   Smoking status: Former    Packs/day: 2.00    Years: 11.00    Total pack years: 22.00    Types: Cigarettes    Quit date: 1990    Years since quitting: 34.1   Smokeless tobacco: Never  Vaping Use   Vaping Use: Never used  Substance and Sexual Activity   Alcohol use: Yes    Comment: occasional   Drug use: No   Sexual activity: Not on file  Other Topics Concern   Not on file  Social History Narrative   Fun: Dance, read, walk, music.    Denies any religious beliefs effecting healthcare.    Social Determinants of Health   Financial Resource Strain: Low Risk  (09/24/2022)   Overall Financial Resource Strain (CARDIA)    Difficulty of Paying Living Expenses: Not hard at all  Food Insecurity: No Food Insecurity (09/24/2022)   Hunger Vital Sign    Worried About Running Out of Food in the Last Year: Never true    Ran Out of Food in the Last Year: Never true   Transportation Needs: No Transportation Needs (09/24/2022)   PRAPARE - Hydrologist (Medical): No    Lack of Transportation (Non-Medical): No  Physical Activity: Sufficiently Active (09/24/2022)   Exercise Vital Sign    Days of Exercise per Week: 3 days    Minutes of Exercise per Session: 60 min  Stress: No Stress Concern Present (09/24/2022)   [place]    Feeling of Stress : Only a little  Social Connections: Moderately Integrated (09/06/2021)   Social Connection and Isolation Panel [NHANES]    Frequency of Communication with Friends and Family: More than three times a week    Frequency of Social Gatherings with Friends and Family: More than three times a week    Attends Religious Services: More than 4 times per year    Active Member of [employer] or Organizations: Yes    Attends Music therapist: More than 4 times per year    Marital Status: Divorced    Tobacco Counseling Counseling given: Not Answered   Clinical Intake:  Pre-visit preparation completed: Yes  Pain : No/denies pain     Nutritional Status: BMI > 30  Obese Nutritional Risks: None Diabetes: No  How often do you need to have someone help you when you read instructions, pamphlets, or other written materials from your doctor or pharmacy?: 1 - Never  Diabetic? no  Interpreter Needed?: No  Information entered by :: NAllen LPN   Activities of Daily Living    09/24/2022   12:08 PM  In your present state of health, do you have any difficulty performing the following activities:  Hearing? 0  Vision? 0  Difficulty concentrating or making decisions? 0  Walking or climbing stairs? 0  Dressing or bathing? 0  Doing errands, shopping? 0  Preparing Food and eating ? N  Using the Toilet? N  In the past six months, have you accidently leaked urine? N  Do you have problems with loss of bowel control? N   Managing your Medications? N  Managing your Finances? N  Housekeeping or managing your Housekeeping? N    Patient Care Team: Nche, Charlene Brooke, NP as PCP - General (Internal Medicine)  Indicate any recent Medical Services you may have received from other than [facility] providers in the past year (date may be approximate).     Assessment:   This is a routine wellness examination for [place].  Hearing/Vision screen Vision Screening - Comments:: Regular eye exams, [facility]  Dietary issues and exercise activities discussed: Current Exercise Habits: Home exercise routine, Type of exercise: Other - see comments ([facility], water aerobics), Time (Minutes): 60, Frequency (Times/Week): 3, Weekly  Exercise (Minutes/Week): 180   Goals Addressed             This Visit's Progress    Patient Stated       09/24/2022, stay healthy and increase exercise       Depression Screen    09/24/2022   12:08 PM 09/06/2021   12:05 PM 06/01/2021   11:32 AM 12/01/2019    2:03 PM 12/11/2017    1:08 PM  PHQ 2/9 Scores  PHQ - 2 Score 0 0 0 0 0  PHQ- 9 Score   0      Fall Risk    09/24/2022   12:07 PM 09/06/2021   12:09 PM 06/01/2021   11:30 AM 12/01/2019    2:08 PM 12/01/2019    2:03 PM  [place] in the past year? 0 0 0 0   Number falls in past yr: 0 0 0 0 0  Injury with Fall? 0 0 0 0 0  Risk for fall due to : Medication side effect No Fall Risks No Fall Risks    Follow up Falls prevention discussed;Education provided;Falls evaluation completed Falls evaluation completed Falls evaluation completed      FALL RISK PREVENTION PERTAINING TO THE HOME:  Any stairs in or around the home? Yes  If so, are there any without handrails? No  Home free of loose throw rugs in walkways, pet beds, electrical cords, etc? Yes  Adequate lighting in your home to reduce risk of falls? Yes   ASSISTIVE DEVICES UTILIZED TO PREVENT FALLS:  Life alert? No  Use of a cane, walker or w/c? No   Grab bars in the bathroom? No  Shower chair or bench in shower? No  Elevated toilet seat or a handicapped toilet? No   TIMED UP AND GO:       Cognitive Function:        09/24/2022   12:10 PM  6CIT Screen  What Year? 0 points  What month? 0 points  What time? 0 points  Count back from 20 0 points  Months in reverse 2 points  Repeat phrase 0 points  Total Score 2 points    Immunizations Immunization History  Administered Date(s) Administered   PFIZER(Purple Top)SARS-COV-2 Vaccination 04/22/2020, 05/13/2020    TDAP status: Up to date  Flu Vaccine status: Up to date  Pneumococcal vaccine status: Up to date  Covid-19 vaccine status: Completed vaccines  Qualifies for Shingles Vaccine? Yes   Zostavax completed No   Shingrix Completed?: No.    Education has been provided regarding the importance of this vaccine. Patient has been advised to call insurance company to determine out of pocket expense if they have not yet received this vaccine. Advised may also receive vaccine at local pharmacy or Health Dept. Verbalized acceptance and understanding.  Screening Tests Health Maintenance  Topic Date Due   DTaP/Tdap/Td (1 - Tdap) Never done   COLONOSCOPY (Pts 45-48yr Insurance coverage will need to be confirmed)  Never done   Zoster Vaccines- Shingrix (1 of 2) Never done   OPHTHALMOLOGY EXAM  09/13/2016   Pneumonia Vaccine 70 Years old (1 of 1 - PCV) Never done   Diabetic kidney evaluation - Urine ACR  10/03/2019   FOOT EXAM  04/30/2020   HEMOGLOBIN A1C  12/01/2021   INFLUENZA VACCINE  03/13/2022   COVID-19 Vaccine (3 - 2023-24 season) 04/13/2022   Diabetic kidney evaluation - eGFR measurement  06/02/2022   Medicare Annual Wellness (AWV)  09/06/2022   MAMMOGRAM  12/26/2022   DEXA SCAN  Completed   Hepatitis C Screening  Completed   HPV VACCINES  Aged Out    Health Maintenance  Health Maintenance Due  Topic Date Due   DTaP/Tdap/Td (1 - Tdap) Never done    COLONOSCOPY (Pts 45-52yr Insurance coverage will need to be confirmed)  Never done   Zoster Vaccines- Shingrix (1 of 2) Never done   OPHTHALMOLOGY EXAM  09/13/2016   Pneumonia Vaccine 70 Years old (1 of 1 - PCV) Never done   Diabetic kidney evaluation - Urine ACR  10/03/2019   FOOT EXAM  04/30/2020   HEMOGLOBIN A1C  12/01/2021   INFLUENZA VACCINE  03/13/2022   COVID-19 Vaccine (3 - 2023-24 season) 04/13/2022   Diabetic kidney evaluation - eGFR measurement  06/02/2022   Medicare Annual Wellness (AWV)  09/06/2022    Colorectal cancer screening: due  Mammogram status: Completed 12/25/2021. Repeat every year  Bone Density status: Completed 08/20/2019.   Lung Cancer Screening: (Low Dose CT Chest recommended if Age 70-80years, 30 pack-year currently smoking OR have quit w/in 15years.) does not qualify.   Lung Cancer Screening Referral: no  Additional Screening:  Hepatitis C Screening: does qualify; Completed 10/02/2018  Vision Screening: Recommended annual ophthalmology exams for early detection of glaucoma and other disorders of the eye. Is the patient up to date with their annual eye exam?  Yes  Who is the provider or what is the name of the office in which the patient attends annual eye exams? [place] is not established with a provider, would they like to be referred to a provider to establish care? No .   Dental Screening: Recommended annual dental exams for proper oral hygiene  Community Resource Referral / Chronic Care Management: CRR required this visit?  No   CCM required this visit?  No      Plan:     I have personally reviewed and noted the following in the patient's chart:   Medical and social history Use of alcohol, tobacco or illicit drugs  Current medications and supplements including opioid prescriptions. Patient is not currently taking opioid prescriptions. Functional ability and status Nutritional status Physical activity Advanced  directives List of other physicians Hospitalizations, surgeries, and ER visits in previous 12 months Vitals Screenings to include cognitive, depression, and falls Referrals and appointments  In addition, I have reviewed and discussed with patient certain preventive protocols, quality metrics, and best practice recommendations. A written personalized care plan for preventive services as well as general preventive health recommendations were provided to patient.     NKellie Simmering LPN   [id number]  Nurse Notes: none  Due to this being a virtual visit, the after visit summary with patients personalized plan was offered to patient via mail or my-chart. Patient would like to access on my-chart

## 2022-10-04 ENCOUNTER — Other Ambulatory Visit: Payer: Self-pay | Admitting: Neurology

## 2022-10-04 DIAGNOSIS — G4701 Insomnia due to medical condition: Secondary | ICD-10-CM

## 2022-10-06 ENCOUNTER — Other Ambulatory Visit: Payer: Self-pay | Admitting: Neurology

## 2022-10-06 DIAGNOSIS — G4701 Insomnia due to medical condition: Secondary | ICD-10-CM

## 2022-10-08 MED ORDER — ZOLPIDEM TARTRATE 5 MG PO TABS: 5.0000 mg | ORAL_TABLET | Freq: Every evening | ORAL | 0 refills | Status: DC | PRN

## 2022-10-22 ENCOUNTER — Other Ambulatory Visit: Payer: Self-pay | Admitting: Neurology

## 2022-10-22 DIAGNOSIS — G4701 Insomnia due to medical condition: Secondary | ICD-10-CM

## 2022-10-22 MED ORDER — ZOLPIDEM TARTRATE 5 MG PO TABS: 5.0000 mg | ORAL_TABLET | Freq: Every evening | ORAL | 0 refills | Status: DC | PRN

## 2022-10-22 NOTE — Telephone Encounter (Signed)
Pt has scheduled a year f/u for medication management. Pt is needing a refill request for her zolpidem (AMBIEN) 5 MG tablet sent in to the [facility] on [address]

## 2022-10-22 NOTE — Addendum Note (Signed)
Addended by: Darleen Crocker on: 10/22/2022 09:44 AM   Modules accepted: Orders

## 2022-10-29 DIAGNOSIS — M170 Bilateral primary osteoarthritis of knee: Secondary | ICD-10-CM | POA: Diagnosis not present

## 2022-11-01 ENCOUNTER — Encounter: Payer: Self-pay | Admitting: Neurology

## 2022-11-01 ENCOUNTER — Ambulatory Visit: Payer: PPO | Admitting: Neurology

## 2022-11-01 VITALS — BP 137/77 | HR 64 | Ht 70.0 in | Wt 258.0 lb

## 2022-11-01 DIAGNOSIS — G4733 Obstructive sleep apnea (adult) (pediatric): Secondary | ICD-10-CM | POA: Diagnosis not present

## 2022-11-01 DIAGNOSIS — E1165 Type 2 diabetes mellitus with hyperglycemia: Secondary | ICD-10-CM | POA: Diagnosis not present

## 2022-11-01 DIAGNOSIS — F330 Major depressive disorder, recurrent, mild: Secondary | ICD-10-CM | POA: Insufficient documentation

## 2022-11-01 DIAGNOSIS — E6609 Other obesity due to excess calories: Secondary | ICD-10-CM | POA: Diagnosis not present

## 2022-11-01 DIAGNOSIS — G4701 Insomnia due to medical condition: Secondary | ICD-10-CM

## 2022-11-01 DIAGNOSIS — Z6837 Body mass index (BMI) 37.0-37.9, adult: Secondary | ICD-10-CM

## 2022-11-01 MED ORDER — ZOLPIDEM TARTRATE 5 MG PO TABS: 5.0000 mg | ORAL_TABLET | Freq: Every evening | ORAL | 5 refills | Status: DC | PRN

## 2022-11-01 NOTE — Patient Instructions (Signed)

## 2022-11-01 NOTE — Progress Notes (Signed)
Provider:  Larey Seat, MD  Primary Care Physician:  Flossie Buffy, NP [place] [id number]     Referring Provider: Flossie Buffy, [address] [place],   [id number]          Chief Complaint according to patient   Patient presents with:     New Patient (Initial Visit)           HISTORY OF PRESENT ILLNESS:  Catherine Baker is a 72 y.o. female patient who is here for revisit 11/01/2022 for  compliance follow up on CPAP .    Patient with hypertension , ankle edema - chronically on Ambien, Night Shift Worker. OSA .Patient here today to f/u with her medication management.       HISTORY OF PRESENT ILLNESS:   11-01-2022: RV for Catherine Baker, who is meanwhile 71 - year- old  African American female patient and was originally seen in [place]   requested by NP Nche. She reports she sleeps well on hr CPAP and low dose Ambien. She finds restorative and refreshing sleep. 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  She tried alternatives to Ambien, alternated between Trazodone and Ambien, it was not a good experience.  She has found a well fitting mask, a FFM I by [employer], sitting with a nasal cradle and she uses a silky sleeve for the headgear.  She is contemplating a nutritional consult, finding help with meal planning in her shift work situation.  The patient is 100% compliant CPAP user by days, she is followed by [facility].  Her CPAP was set up on 08-08-2021.  She has a ramp function starting at 4 cm water and minimum setting of 5 and a maximum setting of 12 cm water.  Her average AHI is 1.2/h which is an excellent resolution.  Her 95th percentile pressure is 7.5 cm well in the room of her current settings.  She does not have high air leaks at all so her mask is fitting her well.  On some days she will have a less than 4-hour use of time because she worked the night shift.  Based on this I am very happy with  her compliance and her response.  She reported the Epworth Sleepiness Scale at 1 out of 24 points and fatigue severity at 9 out of 63 points her geriatric depression score was endorsed at 1 out of 15 points.          RV on 09/26/2021 : 09-26-2021: Catherine Baker underwent a HST while in reclined position- and had an AHI of 18/h  no hypoxia and bradycardia intermittently. She was ordered a CPAP, received a Luna machine- The data I can review today ranged from 27 August 2021 to 13 February 30 days and with an average usage time of 4 hours and 7 minutes so the patient is straddling to get enough hours daily and to CPAP use.  The ramp pressure started for a minimum pressure is maintained at 5 and a maximum pressure of 12 cm water, average residual AHI is 1.8/h which is very good, average leak is 14.1 L/min, she does not produce many central apneas and the average 95th percentile pressure is only 7 cm water.  So what I like for Catherine Baker to do is to try to use the machine for 5 hours or more each day so that we have enough time logged.  The day by day review of data shows that she has controlled her apnea on all of the days in the last 30 days. She is using a nasal mask. She likes it but reported a skin eruption- she may need a silk sleeve for the headgear. Needs to log more hours.     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  However for many decades she will not shifts at and she developed insomnia most likely in response to her shiftwork circadian rhythm.  She has been taking Ambien for a long time.  She also has gained weight and has experienced high blood pressures, given concerns for the possible presence of obstructive sleep apnea.  As far as she knows there has never been another sleep evaluation relation, and it seems that her primary care provider now is also concerned about the  long-term use of Ambien.  Current BMI is 40.7 today's blood pressure was elevated at 150/76 mmHg.   The patient is not an active smoker and she is not drinking alcohol. Catherine Baker a right -handed Black or [place] American female who  has a past medical history of Adjustment disorder with mixed anxiety and depressed mood (12/09/2019), Diabetes mellitus without complication ([place]), and Hypertension.    Review of Systems: Out of a complete 14 system review, the patient complains of only the following symptoms, and all other reviewed systems are negative.:  Fatigue, sleepiness , snoring, fragmented sleep, yes  to Insomnia, no RLS, Nocturia 1-2 times    How likely are you to doze in the following situations: 0 = not likely, 1 = slight chance, 2 = moderate chance, 3 = high chance   Sitting and Reading? Watching Television? Sitting inactive in a public place (theater or meeting)? As a passenger in a car for an hour without a break? Lying down in the afternoon when circumstances permit? Sitting and talking to someone? Sitting quietly after lunch without alcohol? In a car, while stopped for a few minutes in traffic?     She reported the Epworth Sleepiness Scale at 1 out of 24 points and fatigue severity at 9 out of 63 points her geriatric depression score was endorsed at 1 out of 15 points.  Social History   Socioeconomic History   Marital status: Legally Separated    Spouse name: Not on file   Number of children: 3   Years of education: 14   Highest education level: Not on file  Occupational History   Occupation: Nurse  Tobacco Use   Smoking status: Former    Packs/day: 2.00    Years: 11.00    Additional pack years: 0.00    Total pack years: 22.00    Types: Cigarettes    Quit date: 1990    Years since quitting: 34.2   Smokeless tobacco: Never  Vaping Use   Vaping Use: Never used  Substance and Sexual Activity   Alcohol use: Yes    Comment: occasional   Drug use: No   Sexual  activity: Not on file  Other Topics Concern   Not on file  Social History Narrative   Fun: Dance, read, walk, music.    Denies any religious beliefs effecting healthcare.    Social Determinants of Health   Financial Resource Strain: Low Risk  (09/24/2022)   Overall Financial Resource Strain (CARDIA)    Difficulty of Paying Living  Expenses: Not hard at all  Food Insecurity: No Food Insecurity (09/24/2022)   Hunger Vital Sign    Worried About Running Out of Food in the Last Year: Never true    Ran Out of Food in the Last Year: Never true  Transportation Needs: No Transportation Needs (09/24/2022)   PRAPARE - Hydrologist (Medical): No    Lack of Transportation (Non-Medical): No  Physical Activity: Sufficiently Active (09/24/2022)   Exercise Vital Sign    Days of Exercise per Week: 3 days    Minutes of Exercise per Session: 60 min  Stress: No Stress Concern Present (09/24/2022)   Advance    Feeling of Stress : Only a little  Social Connections: Moderately Integrated (09/06/2021)   Social Connection and Isolation Panel [NHANES]    Frequency of Communication with Friends and Family: More than three times a week    Frequency of Social Gatherings with Friends and Family: More than three times a week    Attends Religious Services: More than 4 times per year    Active Member of [employer] or Organizations: Yes    Attends Music therapist: More than 4 times per year    Marital Status: Divorced    Family History  Problem Relation Age of Onset   Hypertension Mother    Heart disease Mother    Hyperlipidemia Mother    Hypertension Father    Heart disease Father    Hyperlipidemia Father    Breast cancer Maternal Grandmother     Past Medical History:  Diagnosis Date   Adjustment disorder with mixed anxiety and depressed mood 12/09/2019   Diabetes mellitus without complication ([place])     Hypertension     Past Surgical History:  Procedure Laterality Date   ABDOMINAL HYSTERECTOMY     TUBAL LIGATION       Current Outpatient Medications on File Prior to Visit  Medication Sig Dispense Refill   b complex vitamins tablet Take 1 tablet by mouth daily.     ibuprofen (ADVIL) 600 MG tablet Take 1 tablet (600 mg total) by mouth every 8 (eight) hours as needed. TAKE 1 TABLET BY MOUTH EVERY 8 HOURS AS NEEDED WITH FOOD 30 tablet 0   telmisartan-hydrochlorothiazide (MICARDIS HCT) 80-25 MG tablet Take 1 tablet by mouth once daily 90 tablet 0   zolpidem (AMBIEN) 5 MG tablet Take 1 tablet (5 mg total) by mouth at bedtime as needed. 30 tablet 0   albuterol (VENTOLIN HFA) 108 (90 Base) MCG/ACT inhaler Inhale 2 puffs into the lungs every 6 (six) hours as needed for wheezing or shortness of breath. (Patient not taking: Reported on 09/24/2022) 8 g 0   benzonatate (TESSALON) 100 MG capsule Take 1 capsule (100 mg total) by mouth 3 (three) times daily as needed for cough. (Patient not taking: Reported on 09/24/2022) 30 capsule 0   doxycycline (VIBRA-TABS) 100 MG tablet Take 1 tablet (100 mg total) by mouth 2 (two) times daily. (Patient not taking: Reported on 09/24/2022) 14 tablet 0   No current facility-administered medications on file prior to visit.    Allergies  Allergen Reactions   Codeine Hives, Nausea Only and Nausea And Vomiting   Crestor [Rosuvastatin Calcium] Other (See Comments)    Myalgia and headache     DIAGNOSTIC DATA (LABS, IMAGING, TESTING) - I reviewed patient records, labs, notes, testing and imaging myself where available.  Lab Results  Component Value  Date   WBC 7.3 06/06/2018   HGB 13.5 06/06/2018   HCT 40.1 06/06/2018   MCV 88.8 06/06/2018   PLT 372.0 06/06/2018      Component Value Date/Time   NA 137 06/02/2021 0823   K 3.6 06/02/2021 0823   CL 102 06/02/2021 0823   CO2 27 06/02/2021 0823   GLUCOSE 128 (H) 06/02/2021 0823   BUN 21 06/02/2021 0823    CREATININE 0.95 06/02/2021 0823   CREATININE 0.90 05/01/2019 1414   CALCIUM 9.8 06/02/2021 0823   PROT 7.3 06/02/2021 0823   ALBUMIN 4.1 06/02/2021 0823   AST 15 06/02/2021 0823   ALT 15 06/02/2021 0823   ALKPHOS 114 06/02/2021 0823   BILITOT 0.5 06/02/2021 0823   GFRNONAA >60 07/26/2015 2208   GFRAA >60 07/26/2015 2208   Lab Results  Component Value Date   CHOL 222 (H) 06/02/2021   HDL 60.20 06/02/2021   LDLCALC 147 (H) 06/02/2021   TRIG 73.0 06/02/2021   CHOLHDL 4 06/02/2021   Lab Results  Component Value Date   HGBA1C 6.4 06/02/2021   No results found for: "VITAMINB12" Lab Results  Component Value Date   TSH 3.22 06/02/2021    PHYSICAL EXAM:  Today's Vitals   11/01/22 0926  BP: 137/77  Pulse: 64  Weight: 258 lb (117 kg)  Height: 5\' 10"  (1.778 m)   Body mass index is 37.02 kg/m.   Wt Readings from Last 3 Encounters:  11/01/22 258 lb (117 kg)  09/24/22 250 lb (113.4 kg)  01/11/22 249 lb (112.9 kg)     Ht Readings from Last 3 Encounters:  11/01/22 5\' 10"  (1.778 m)  09/24/22 5\' 7"  (1.702 m)  01/11/22 5\' 7"  (1.702 m)      General: The patient is awake, alert and appears not in acute distress. The patient is well groomed. Head: Normocephalic, atraumatic. Neck is supple.  Mallampati 1- lateral restriction, like a curtain. ,  neck circumference:15.5  inches . Nasal airflow  patent.  Retrognathia is  seen.  Dental status: biological  Cardiovascular:  Regular rate and cardiac rhythm by pulse,  without distended neck veins. Respiratory: Lungs are clear to auscultation.  Skin:  Without evidence of ankle edema, or rash. Trunk: The patient's posture is erect.   NEUROLOGIC EXAM: The patient is awake and alert, oriented to place and time.   Memory subjective described as intact.  Attention span & concentration ability appears normal.  Speech is fluent,  without  dysarthria, dysphonia or aphasia.  Mood and affect are appropriate.   Cranial nerves: no loss of  smell or taste reported  Pupils are equal and briskly reactive to light.  Funduscopic exam deferred..  Extraocular movements in vertical and horizontal planes were intact and without nystagmus. No Diplopia. Visual fields by finger perimetry are intact. Hearing was intact to soft voice and finger rubbing.    Facial sensation intact to fine touch.  Facial motor strength is symmetric and tongue and uvula move midline.  Neck ROM : rotation, tilt and flexion extension were normal for age and shoulder shrug was symmetrical.    Motor exam:  Symmetric bulk, tone and ROM.   Normal tone without cog wheeling, symmetric grip strength . She reports restricted ROM in the right knee, arthritis.   Gait and station: Patient could rise unassisted from a seated position, walked without assistive device.  Stance is of normal width/ base .  Toe and heel walk were deferred.  Deep tendon reflexes: in the  upper and lower extremities are symmetric and intact.  Babinski response was deferred.    ASSESSMENT AND PLAN 72 y.o. year old female  here with:    1) OSA on CPAP- highly compliant.   2) Insomnia treated with Ambien - she has not been able to find relief with a non-habit forming medication and  is aware of the risks of continuous use. 5 mg Ambien refilled.   3) Shift work -Nursing / she reports no EDS and is not in need of modafinil.    I plan to follow up either personally or through our NP within 12 months.   I would like to thank Nche, Charlene Brooke, NP and [place], Charlene Brooke, [place],  [place] [id number] for allowing me to meet with and to take care of this pleasant patient.    After spending a total time of  25  minutes face to face and additional time for physical and neurologic examination, review of laboratory studies,  personal review of imaging studies, reports and results of other testing and review of referral information / records as far as provided in visit,    Electronically signed by: Larey Seat, MD 11/01/2022 9:52 AM  [facility] certified by The [employer] of Sleep Medicine and Diplomate of the [employer] of Sleep Medicine. Board certified In Neurology through the [place], Fellow of the [employer] of Neurology. Medical Director of [employer].

## 2023-02-20 ENCOUNTER — Ambulatory Visit (INDEPENDENT_AMBULATORY_CARE_PROVIDER_SITE_OTHER): Payer: PPO | Admitting: Family Medicine

## 2023-02-20 ENCOUNTER — Encounter: Payer: Self-pay | Admitting: Family Medicine

## 2023-02-20 VITALS — BP 140/84 | HR 58 | Temp 98.1°F | Ht 70.0 in

## 2023-02-20 DIAGNOSIS — R110 Nausea: Secondary | ICD-10-CM | POA: Diagnosis not present

## 2023-02-20 DIAGNOSIS — M545 Low back pain, unspecified: Secondary | ICD-10-CM

## 2023-02-20 DIAGNOSIS — R1084 Generalized abdominal pain: Secondary | ICD-10-CM

## 2023-02-20 DIAGNOSIS — M48061 Spinal stenosis, lumbar region without neurogenic claudication: Secondary | ICD-10-CM | POA: Diagnosis not present

## 2023-02-20 DIAGNOSIS — R109 Unspecified abdominal pain: Secondary | ICD-10-CM

## 2023-02-20 DIAGNOSIS — R10A0 Flank pain, unspecified side: Secondary | ICD-10-CM

## 2023-02-20 LAB — COMPREHENSIVE METABOLIC PANEL
ALT: 17 U/L (ref 0–35)
AST: 22 U/L (ref 0–37)
Albumin: 4 g/dL (ref 3.5–5.2)
Alkaline Phosphatase: 106 U/L (ref 39–117)
BUN: 18 mg/dL (ref 6–23)
CO2: 29 mEq/L (ref 19–32)
Calcium: 10.3 mg/dL (ref 8.4–10.5)
Chloride: 101 mEq/L (ref 96–112)
Creatinine, Ser: 0.86 mg/dL (ref 0.40–1.20)
GFR: 68.5 mL/min (ref >60.00)
Glucose, Bld: 113 mg/dL — ABNORMAL HIGH (ref 70–99)
Potassium: 3.9 mEq/L (ref 3.5–5.1)
Sodium: 137 mEq/L (ref 135–145)
Total Bilirubin: 0.5 mg/dL (ref 0.2–1.2)
Total Protein: 7.7 g/dL (ref 6.0–8.3)

## 2023-02-20 LAB — CBC WITH DIFFERENTIAL/PLATELET
Basophils Absolute: 0.1 10*3/uL (ref 0.0–0.1)
Basophils Relative: 1 % (ref 0.0–3.0)
Eosinophils Absolute: 0.1 10*3/uL (ref 0.0–0.7)
Eosinophils Relative: 1.2 % (ref 0.0–5.0)
HCT: 42.3 % (ref 36.0–46.0)
Hemoglobin: 13.9 g/dL (ref 12.0–15.0)
Lymphocytes Relative: 39.4 % (ref 12.0–46.0)
Lymphs Abs: 3 10*3/uL (ref 0.7–4.0)
MCHC: 32.8 g/dL (ref 30.0–36.0)
MCV: 90.3 fl (ref 78.0–100.0)
Monocytes Absolute: 0.7 10*3/uL (ref 0.1–1.0)
Monocytes Relative: 9.2 % (ref 3.0–12.0)
Neutro Abs: 3.7 10*3/uL (ref 1.4–7.7)
Neutrophils Relative %: 49.2 % (ref 43.0–77.0)
Platelets: 357 10*3/uL (ref 150.0–400.0)
RBC: 4.68 Mil/uL (ref 3.87–5.11)
RDW: 13.6 % (ref 11.5–15.5)
WBC: 7.5 10*3/uL (ref 4.0–10.5)

## 2023-02-20 LAB — POC URINALSYSI DIPSTICK (AUTOMATED)
Bilirubin, UA: NEGATIVE
Blood, UA: NEGATIVE
Glucose, UA: NEGATIVE
Ketones, UA: NEGATIVE
Leukocytes, UA: NEGATIVE
Nitrite, UA: NEGATIVE
Protein, UA: NEGATIVE
Spec Grav, UA: 1.025 (ref 1.010–1.025)
Urobilinogen, UA: 0.2 E.U./dL
pH, UA: 6 (ref 5.0–8.0)

## 2023-02-20 LAB — LIPASE: Lipase: 11 U/L (ref 11.0–59.0)

## 2023-02-20 MED ORDER — ONDANSETRON HCL 4 MG PO TABS: 4.0000 mg | ORAL_TABLET | Freq: Three times a day (TID) | ORAL | 0 refills | Status: DC | PRN

## 2023-02-20 MED ORDER — ONDANSETRON HCL 4 MG/5ML PO SOLN
4.0000 mg | Freq: Once | ORAL | Status: AC
Start: 2023-02-20 — End: ?

## 2023-02-20 MED ORDER — KETOROLAC TROMETHAMINE 30 MG/ML IJ SOLN
30.0000 mg | Freq: Once | INTRAMUSCULAR | Status: AC
Start: 2023-02-20 — End: 2023-02-20
  Administered 2023-02-20: 30 mg via INTRAMUSCULAR

## 2023-02-20 MED ORDER — ONDANSETRON HCL 4 MG/2ML IJ SOLN
4.0000 mg | Freq: Once | INTRAMUSCULAR | Status: AC
Start: 2023-02-20 — End: 2023-02-20
  Administered 2023-02-20: 4 mg via INTRAMUSCULAR

## 2023-02-20 NOTE — Addendum Note (Signed)
Addended by: Johnella Moloney on: 02/20/2023 10:55 AM   Modules accepted: Orders

## 2023-02-20 NOTE — Progress Notes (Signed)
Acute Office Visit  Subjective:     Patient ID: Catherine Baker, female    DOB: Dec 26, 1950, 72 y.o.   MRN: [id number]  Chief Complaint  Patient presents with   Flank Pain    Patient complains of severe right flank pain since 2am, no known injury    Flank Pain   Patient is in today for severe right sided back/ flank pain, states it woke her up from a dead sleep around 2 am, Catherine Baker went to bathroom, got a little better, but then it moved to the other side, couldn't lay down or sit, has never had this happen to her before, no fever or chills, hurting also in the pelvic area, no burning with urination, urine looks relatively normal, maybe some mild nausea but no vomiting. Catherine Baker describes the pain as a dull sensation, Catherine Baker had a BM and it was normal, didn't affect her pain at all when Catherine Baker had it.   Catherine Baker reports Catherine Baker has not done any extra physical activity, no heavy lifting or manual labor that would have caused the pain.   Review of Systems  Genitourinary:  Positive for flank pain.        Objective:    BP (!) 140/84 (BP Location: Left Arm, Patient Position: Sitting, Cuff Size: Large)   Pulse (!) 58   Temp 98.1 F (36.7 C) (Oral)   Ht 5\' 10"  (1.778 m)   SpO2 98%   BMI 37.02 kg/m    Physical Exam Vitals reviewed.  Constitutional:      General: Catherine Baker is in acute distress.     Appearance: Normal appearance. Catherine Baker is well-groomed.  Cardiovascular:     Rate and Rhythm: Normal rate and regular rhythm.     Heart sounds: S1 normal and S2 normal.  Pulmonary:     Effort: Pulmonary effort is normal.     Breath sounds: Normal breath sounds and air entry.  Abdominal:     General: Bowel sounds are normal. There is no distension.     Tenderness: There is no right CVA tenderness, left CVA tenderness or guarding.  Musculoskeletal:     Right lower leg: No edema.     Left lower leg: No edema.  Neurological:     Mental Status: Catherine Baker is alert and oriented to person, place, and time. Mental status  is at baseline.     Gait: Gait is intact.  Psychiatric:        Mood and Affect: Mood and affect normal.        Speech: Speech normal.        Behavior: Behavior normal.        Judgment: Judgment normal.     Results for orders placed or performed in visit on 02/20/23  POCT Urinalysis Dipstick (Automated)  Result Value Ref Range   Color, UA yellow    Clarity, UA clear    Glucose, UA Negative Negative   Bilirubin, UA negative    Ketones, UA negative    Spec Grav, UA 1.025 1.010 - 1.025   Blood, UA negative    pH, UA 6.0 5.0 - 8.0   Protein, UA Negative Negative   Urobilinogen, UA 0.2 0.2 or 1.0 E.U./dL   Nitrite, UA negative    Leukocytes, UA Negative Negative        Assessment & Plan:   Problem List Items Addressed This Visit   None Visit Diagnoses     Flank pain    -  Primary  Relevant Medications   ketorolac (TORADOL) 30 MG/ML injection 30 mg (Start on 02/20/2023 10:30 AM)   Other Relevant Orders   POCT Urinalysis Dipstick (Automated) (Completed)   CBC with Differential/Platelets   CMP   Lipase   Nausea       Relevant Medications   ondansetron (ZOFRAN) 4 MG tablet   ondansetron (ZOFRAN) 4 MG/5ML solution 4 mg (Start on 02/20/2023 10:30 AM)     UA is negative for blood, LE and nitrites, negative for CVA tenderness on exam, having a significant amount of pain, I will order CBC and CMP as well as lipase to rule out other causes of her BL flank pain. I advised that if her pain continued to be this severe we would need to order imaging studies, most likely a CT of the abd/pelvis to look for the cause of her pain. Wor excuse written for today. Gave zofran and ketorolac in the office today to help alleviate her pain. Further orders TBD.  Meds ordered this encounter  Medications   ondansetron (ZOFRAN) 4 MG tablet    Sig: Take 1 tablet (4 mg total) by mouth every 8 (eight) hours as needed for nausea or vomiting.    Dispense:  20 tablet    Refill:  0   ketorolac (TORADOL)  30 MG/ML injection 30 mg   ondansetron (ZOFRAN) 4 MG/5ML solution 4 mg    No follow-ups on file.  Karie Georges, MD

## 2023-02-21 ENCOUNTER — Encounter: Payer: Self-pay | Admitting: Family Medicine

## 2023-02-21 ENCOUNTER — Ambulatory Visit (HOSPITAL_BASED_OUTPATIENT_CLINIC_OR_DEPARTMENT_OTHER)
Admission: RE | Admit: 2023-02-21 | Discharge: 2023-02-21 | Disposition: A | Discharge status: Home / Self Care | Payer: PPO | Source: Ambulatory Visit | Attending: Family Medicine | Admitting: Family Medicine

## 2023-02-21 ENCOUNTER — Telehealth: Payer: Self-pay | Admitting: *Deleted

## 2023-02-21 ENCOUNTER — Ambulatory Visit: Payer: PPO | Admitting: Family Medicine

## 2023-02-21 DIAGNOSIS — R1084 Generalized abdominal pain: Secondary | ICD-10-CM

## 2023-02-21 DIAGNOSIS — R109 Unspecified abdominal pain: Secondary | ICD-10-CM | POA: Diagnosis not present

## 2023-02-21 DIAGNOSIS — R110 Nausea: Secondary | ICD-10-CM | POA: Diagnosis not present

## 2023-02-21 DIAGNOSIS — K5730 Diverticulosis of large intestine without perforation or abscess without bleeding: Secondary | ICD-10-CM | POA: Diagnosis not present

## 2023-02-21 DIAGNOSIS — K449 Diaphragmatic hernia without obstruction or gangrene: Secondary | ICD-10-CM | POA: Diagnosis not present

## 2023-02-21 DIAGNOSIS — N200 Calculus of kidney: Secondary | ICD-10-CM | POA: Diagnosis not present

## 2023-02-21 MED ORDER — TRAMADOL HCL 50 MG PO TABS
50.0000 mg | ORAL_TABLET | Freq: Three times a day (TID) | ORAL | 0 refills | Status: AC | PRN
Start: 2023-02-21 — End: 2023-02-28

## 2023-02-21 NOTE — Addendum Note (Signed)
Addended by: Karie Georges on: 02/21/2023 02:00 PM   Modules accepted: Orders

## 2023-02-21 NOTE — Telephone Encounter (Signed)
Catherine Baker, referral coordinator stated the patient's CT scan was scheduled for today at 6pm at the [facility] and to check in at the ER entrance at 5:45pm.  I called the patient and informed her of this.

## 2023-02-21 NOTE — Addendum Note (Signed)
Addended by: Karie Georges on: 02/21/2023 03:35 PM   Modules accepted: Orders

## 2023-02-21 NOTE — Telephone Encounter (Signed)
Patient wanted to know if something for pain could be sent to [facility] on [address] for her as she thought this was sent in yesterday?  Message sent to Dr Casimiro Needle.

## 2023-02-21 NOTE — Telephone Encounter (Signed)
Spoke with the patient and informed her of the message below.  Stated she will take Ibuprofen only and await CT scan.

## 2023-02-21 NOTE — Progress Notes (Signed)
Gave report at 7:33 pm to DR Nira Conn

## 2023-02-22 MED ORDER — CYCLOBENZAPRINE HCL 10 MG PO TABS
10.0000 mg | ORAL_TABLET | Freq: Three times a day (TID) | ORAL | 0 refills | Status: DC | PRN
Start: 2023-02-22 — End: 2023-06-24

## 2023-02-22 MED ORDER — METHYLPREDNISOLONE 4 MG PO TBPK
ORAL_TABLET | ORAL | 0 refills | Status: DC
Start: 2023-02-22 — End: 2023-06-24

## 2023-02-22 NOTE — Addendum Note (Signed)
Addended by: Karie Georges on: 02/22/2023 08:04 AM   Modules accepted: Orders

## 2023-02-26 ENCOUNTER — Encounter: Payer: Self-pay | Admitting: Nurse Practitioner

## 2023-02-26 ENCOUNTER — Ambulatory Visit: Payer: PPO | Admitting: Nurse Practitioner

## 2023-02-26 ENCOUNTER — Other Ambulatory Visit: Payer: Self-pay | Admitting: Nurse Practitioner

## 2023-02-26 VITALS — BP 118/80 | HR 80 | Ht 70.0 in | Wt 250.0 lb

## 2023-02-26 DIAGNOSIS — E782 Mixed hyperlipidemia: Secondary | ICD-10-CM

## 2023-02-26 DIAGNOSIS — I10 Essential (primary) hypertension: Secondary | ICD-10-CM

## 2023-02-26 DIAGNOSIS — R109 Unspecified abdominal pain: Secondary | ICD-10-CM | POA: Diagnosis not present

## 2023-02-26 DIAGNOSIS — E1165 Type 2 diabetes mellitus with hyperglycemia: Secondary | ICD-10-CM

## 2023-02-26 DIAGNOSIS — G8929 Other chronic pain: Secondary | ICD-10-CM | POA: Diagnosis not present

## 2023-02-26 DIAGNOSIS — E039 Hypothyroidism, unspecified: Secondary | ICD-10-CM | POA: Diagnosis not present

## 2023-02-26 DIAGNOSIS — M51369 Other intervertebral disc degeneration, lumbar region without mention of lumbar back pain or lower extremity pain: Secondary | ICD-10-CM

## 2023-02-26 DIAGNOSIS — M5136 Other intervertebral disc degeneration, lumbar region: Secondary | ICD-10-CM | POA: Diagnosis not present

## 2023-02-26 DIAGNOSIS — M545 Low back pain, unspecified: Secondary | ICD-10-CM | POA: Diagnosis not present

## 2023-02-26 DIAGNOSIS — R10A0 Flank pain, unspecified side: Secondary | ICD-10-CM

## 2023-02-26 LAB — POC URINALSYSI DIPSTICK (AUTOMATED)

## 2023-02-26 LAB — TSH: TSH: 4.05 u[IU]/mL (ref 0.35–5.50)

## 2023-02-26 LAB — LIPID PANEL
Cholesterol: 274 mg/dL — ABNORMAL HIGH (ref 0–200)
HDL: 60.8 mg/dL (ref >39.00)
LDL Cholesterol: 195 mg/dL — ABNORMAL HIGH (ref 0–99)
NonHDL: 213.55
Total CHOL/HDL Ratio: 5
Triglycerides: 93 mg/dL (ref 0.0–149.0)
VLDL: 18.6 mg/dL (ref 0.0–40.0)

## 2023-02-26 LAB — T4, FREE: Free T4: 0.98 ng/dL (ref 0.60–1.60)

## 2023-02-26 LAB — HEMOGLOBIN A1C: Hgb A1c MFr Bld: 6.3 % (ref 4.6–6.5)

## 2023-02-26 MED ORDER — IBUPROFEN 800 MG PO TABS
800.0000 mg | ORAL_TABLET | Freq: Three times a day (TID) | ORAL | 0 refills | Status: DC | PRN
Start: 2023-02-26 — End: 2024-06-22

## 2023-02-26 NOTE — Patient Instructions (Signed)
Go to lab Schedule appointment with neurosurgery when contacted Have FMLA form faxed to me. Completed oral prednisone, ibuprofen and flexeril. Avoid prolonged sitting.

## 2023-02-26 NOTE — Assessment & Plan Note (Signed)
BP at goal with micardis hct BP Readings from Last 3 Encounters:  02/26/23 118/80  02/20/23 (!) 140/84  11/01/22 137/77    Normal CMP Maintain med dose

## 2023-02-26 NOTE — Assessment & Plan Note (Signed)
Present with right flank pain, onset 6days ago, she was evaluated by another provider, CT ABDOMEN/pelvis, cbc, cmp, UA, and lipase completed. Medrol dose pack, flexeril, tramadol, and toradol injection was prescribed. Today she reports reports nausea with tramadol and no pain relief. She took Ibuprofen 800mg  and flexeril 10mg  at hs with relief. This was triggered by current job which requires prolonged sitting. She denies any previous back injury or surgery. CT ABDOMEN /pelvis: Lumbar spondylosis with interval progression, especially at the L4-L5 level. Spinal stenosis and encroachment of neural foramina is seen at multiple levels in lumbar spine.  Advised to complete medrol dose pack and take flexeril at hs. Sent ibuprofen 800mg  rx. Advised to take meds with food. She declined MRI lumbar spine due to fear of closed spaces. Entered referral to neurosurgery. Will complete FMLA form to allow frequent breaks during working hours.

## 2023-02-26 NOTE — Assessment & Plan Note (Signed)
Stable TSH and T4

## 2023-02-26 NOTE — Assessment & Plan Note (Signed)
Repeat lipid panel Unable to tolerate crestor

## 2023-02-26 NOTE — Progress Notes (Signed)
Established Patient Visit  Patient: Catherine Baker   DOB: 1951-08-01   72 y.o. Female  MRN: [id number] Visit Date: 02/26/2023  Subjective:    Chief Complaint  Patient presents with   Flank Pain    Right side flank pain.  Seen 7/10 - CT done 7/11   Chronic Care Management    F/up on HYPERTENSION, DIABETES and cholesterol   HPI DDD (degenerative disc disease), lumbar Present with right flank pain, onset 6days ago, she was evaluated by another provider, CT ABDOMEN/pelvis, cbc, cmp, UA, and lipase completed. Medrol dose pack, flexeril, tramadol, and toradol injection was prescribed. Today she reports reports nausea with tramadol and no pain relief. She took Ibuprofen 800mg  and flexeril 10mg  at hs with relief. This was triggered by current job which requires prolonged sitting. She denies any previous back injury or surgery. CT ABDOMEN /pelvis: Lumbar spondylosis with interval progression, especially at the L4-L5 level. Spinal stenosis and encroachment of neural foramina is seen at multiple levels in lumbar spine.  Advised to complete medrol dose pack and take flexeril at hs. Sent ibuprofen 800mg  rx. Advised to take meds with food. She declined MRI lumbar spine due to fear of closed spaces. Entered referral to neurosurgery. Will complete FMLA form to allow frequent breaks during working hours.   Essential hypertension BP at goal with micardis hct BP Readings from Last 3 Encounters:  02/26/23 118/80  02/20/23 (!) 140/84  11/01/22 137/77    Normal CMP Maintain med dose  Hypothyroidism Stable TSH and T4  Mixed hyperlipidemia Repeat lipid panel Unable to tolerate crestor  Type 2 diabetes mellitus Repeat hgbA1c today   Reviewed medical, surgical, and social history today  Medications: Outpatient Medications Prior to Visit  Medication Sig   b complex vitamins tablet Take 1 tablet by mouth daily.   cyclobenzaprine (FLEXERIL) 10 MG tablet Take 1 tablet (10 mg  total) by mouth 3 (three) times daily as needed for muscle spasms.   methylPREDNISolone (MEDROL DOSEPAK) 4 MG TBPK tablet Take package as directed.   ondansetron (ZOFRAN) 4 MG tablet Take 1 tablet (4 mg total) by mouth every 8 (eight) hours as needed for nausea or vomiting.   traMADol (ULTRAM) 50 MG tablet Take 1 tablet (50 mg total) by mouth every 8 (eight) hours as needed for up to 7 days.   zolpidem (AMBIEN) 5 MG tablet Take 1 tablet (5 mg total) by mouth at bedtime as needed.   [DISCONTINUED] ibuprofen (ADVIL) 600 MG tablet Take 1 tablet (600 mg total) by mouth every 8 (eight) hours as needed. TAKE 1 TABLET BY MOUTH EVERY 8 HOURS AS NEEDED WITH FOOD   [DISCONTINUED] telmisartan-hydrochlorothiazide (MICARDIS HCT) 80-25 MG tablet Take 1 tablet by mouth once daily   Facility-Administered Medications Prior to Visit  Medication Dose Route Frequency Provider   ondansetron (ZOFRAN) 4 MG/5ML solution 4 mg  4 mg Oral Once Karie Georges, MD   Reviewed past medical and social history.   ROS per HPI above      Objective:  BP 118/80 (BP Location: Left Arm, Patient Position: Sitting, Cuff Size: Large)   Pulse 80   Ht 5\' 10"  (1.778 m)   Wt 250 lb (113.4 kg)   SpO2 98%   BMI 35.87 kg/m      Physical Exam Vitals and nursing note reviewed.  Cardiovascular:     Rate and Rhythm: Normal rate.  Pulses: Normal pulses.  Pulmonary:     Effort: Pulmonary effort is normal.  Musculoskeletal:        General: Tenderness present.     Thoracic back: Normal.     Lumbar back: Tenderness present. No bony tenderness. Normal range of motion. Negative right straight leg raise test and negative left straight leg raise test.     Right hip: Normal.     Right upper leg: Normal.     Right knee: Normal.     Right lower leg: Normal. No edema.     Left lower leg: No edema.  Skin:    Findings: No rash.  Neurological:     Mental Status: She is alert and oriented to person, place, and time.     Results  for orders placed or performed in visit on 02/26/23  POCT Urinalysis Dipstick (Automated)  Result Value Ref Range   Color, UA     Clarity, UA     Glucose, UA     Bilirubin, UA     Ketones, UA     Spec Grav, UA     Blood, UA     pH, UA     Protein, UA     Urobilinogen, UA     Nitrite, UA     Leukocytes, UA        Assessment & Plan:    Problem List Items Addressed This Visit       Cardiovascular and Mediastinum   Essential hypertension    BP at goal with micardis hct BP Readings from Last 3 Encounters:  02/26/23 118/80  02/20/23 (!) 140/84  11/01/22 137/77    Normal CMP Maintain med dose        Endocrine   Hypothyroidism    Stable TSH and T4      Relevant Orders   TSH   T4, free   Type 2 diabetes mellitus (HCC)    Repeat hgbA1c today      Relevant Orders   Hemoglobin A1c     Musculoskeletal and Integument   DDD (degenerative disc disease), lumbar    Present with right flank pain, onset 6days ago, she was evaluated by another provider, CT ABDOMEN/pelvis, cbc, cmp, UA, and lipase completed. Medrol dose pack, flexeril, tramadol, and toradol injection was prescribed. Today she reports reports nausea with tramadol and no pain relief. She took Ibuprofen 800mg  and flexeril 10mg  at hs with relief. This was triggered by current job which requires prolonged sitting. She denies any previous back injury or surgery. CT ABDOMEN /pelvis: Lumbar spondylosis with interval progression, especially at the L4-L5 level. Spinal stenosis and encroachment of neural foramina is seen at multiple levels in lumbar spine.  Advised to complete medrol dose pack and take flexeril at hs. Sent ibuprofen 800mg  rx. Advised to take meds with food. She declined MRI lumbar spine due to fear of closed spaces. Entered referral to neurosurgery. Will complete FMLA form to allow frequent breaks during working hours.       Relevant Medications   ibuprofen (ADVIL) 800 MG tablet   Other Relevant  Orders   Ambulatory referral to Neurosurgery     Other   Acute exacerbation of chronic low back pain - Primary   Relevant Medications   ibuprofen (ADVIL) 800 MG tablet   Other Relevant Orders   Ambulatory referral to Neurosurgery   Mixed hyperlipidemia    Repeat lipid panel Unable to tolerate crestor      Relevant Orders   Lipid panel  Other Visit Diagnoses     Flank pain       Relevant Orders   POCT Urinalysis Dipstick (Automated) (Completed)      Return in about 6 months (around 08/29/2023) for Hypothyroidism, DM, hyperlipidemia (fasting), HTN.     Alysia Penna, NP

## 2023-02-26 NOTE — Assessment & Plan Note (Signed)
Repeat hgbA1c today 

## 2023-03-05 ENCOUNTER — Telehealth: Payer: Self-pay | Admitting: Nurse Practitioner

## 2023-03-05 DIAGNOSIS — E1169 Type 2 diabetes mellitus with other specified complication: Secondary | ICD-10-CM

## 2023-03-05 NOTE — Telephone Encounter (Signed)
Pt return your call and she said give her a call back concerning her medication

## 2023-03-06 MED ORDER — ATORVASTATIN CALCIUM 20 MG PO TABS
20.0000 mg | ORAL_TABLET | ORAL | 3 refills | Status: DC
Start: 2023-03-06 — End: 2023-10-14

## 2023-03-06 NOTE — Telephone Encounter (Signed)
Left patient a detailed voice message to return call to office.   

## 2023-03-06 NOTE — Addendum Note (Signed)
Addended by: Michaela Corner on: 03/06/2023 04:30 PM   Modules accepted: Orders

## 2023-03-06 NOTE — Telephone Encounter (Signed)
Spoke with pt and she stated she would like to try Lipitor since she researched it and noticed it has less side effects compared to pravastatin. She would like to know your recommendations.

## 2023-03-06 NOTE — Telephone Encounter (Signed)
Patient is aware of annotation below and verbalized understanding.  

## 2023-03-11 ENCOUNTER — Encounter: Payer: Self-pay | Admitting: Nurse Practitioner

## 2023-04-04 DIAGNOSIS — G4733 Obstructive sleep apnea (adult) (pediatric): Secondary | ICD-10-CM | POA: Diagnosis not present

## 2023-04-09 ENCOUNTER — Telehealth: Payer: Self-pay

## 2023-04-09 NOTE — Telephone Encounter (Signed)
Sarah,  Based on dme she should be on res med. I was unable to locate data and the next visit is mychart. Do we need to schedule in person so she can bring machine to do card download? Please see picture down below.  Thanks,  [employer]

## 2023-04-09 NOTE — Telephone Encounter (Signed)
Phone room: Please call and encourage pt to bring cpap machine to next appt so we can pull her sleep data.  Thanks,  Production assistant, radio

## 2023-04-10 NOTE — Telephone Encounter (Signed)
I have not met this patient yet. Dr. Golden Hurter saw her in March 2024, recommended 1 year follow up. Can push out to March 2024 in office for download.

## 2023-04-11 NOTE — Telephone Encounter (Signed)
Pt called back, message from CMA was relayed.  Pt agreed to be scheduled for an in office appointment.  Pt told to bring CPAP and power cord, this is FYI to POD 2

## 2023-04-17 ENCOUNTER — Other Ambulatory Visit: Payer: Self-pay | Admitting: Neurology

## 2023-04-17 ENCOUNTER — Telehealth: Payer: PPO | Admitting: Neurology

## 2023-04-17 DIAGNOSIS — G4701 Insomnia due to medical condition: Secondary | ICD-10-CM

## 2023-04-17 MED ORDER — ZOLPIDEM TARTRATE 5 MG PO TABS
5.0000 mg | ORAL_TABLET | Freq: Every evening | ORAL | 2 refills | Status: DC | PRN
Start: 2023-04-17 — End: 2023-06-24

## 2023-04-17 NOTE — Telephone Encounter (Signed)
Pt is asking if until her upcoming appointment(which is on wait list) can she get refills on her zolpidem (AMBIEN) 5 MG tablet to [facility] [id number]

## 2023-04-19 DIAGNOSIS — M170 Bilateral primary osteoarthritis of knee: Secondary | ICD-10-CM | POA: Diagnosis not present

## 2023-05-17 ENCOUNTER — Other Ambulatory Visit: Payer: Self-pay | Admitting: Nurse Practitioner

## 2023-05-17 DIAGNOSIS — Z1212 Encounter for screening for malignant neoplasm of rectum: Secondary | ICD-10-CM

## 2023-05-17 DIAGNOSIS — Z1211 Encounter for screening for malignant neoplasm of colon: Secondary | ICD-10-CM

## 2023-05-22 ENCOUNTER — Other Ambulatory Visit: Payer: Self-pay | Admitting: Nurse Practitioner

## 2023-05-22 DIAGNOSIS — I10 Essential (primary) hypertension: Secondary | ICD-10-CM

## 2023-06-18 DIAGNOSIS — Z1212 Encounter for screening for malignant neoplasm of rectum: Secondary | ICD-10-CM | POA: Diagnosis not present

## 2023-06-18 DIAGNOSIS — Z1211 Encounter for screening for malignant neoplasm of colon: Secondary | ICD-10-CM | POA: Diagnosis not present

## 2023-06-24 ENCOUNTER — Ambulatory Visit: Payer: PPO | Admitting: Neurology

## 2023-06-24 ENCOUNTER — Encounter: Payer: Self-pay | Admitting: Neurology

## 2023-06-24 VITALS — BP 140/73 | HR 67 | Ht 67.0 in | Wt 255.0 lb

## 2023-06-24 DIAGNOSIS — G4733 Obstructive sleep apnea (adult) (pediatric): Secondary | ICD-10-CM | POA: Diagnosis not present

## 2023-06-24 DIAGNOSIS — G4701 Insomnia due to medical condition: Secondary | ICD-10-CM | POA: Diagnosis not present

## 2023-06-24 MED ORDER — ZOLPIDEM TARTRATE 5 MG PO TABS: 5.0000 mg | ORAL_TABLET | Freq: Every evening | ORAL | 5 refills | Status: DC | PRN

## 2023-06-24 NOTE — Addendum Note (Signed)
Addended by: Melvyn Novas on: 06/24/2023 04:41 PM   Modules accepted: Orders

## 2023-06-24 NOTE — Patient Instructions (Signed)
Living With Sleep Apnea:  Sleep apnea is a condition in which breathing pauses or becomes shallow during sleep. Sleep apnea is most commonly caused by a collapsed or blocked airway. People with sleep apnea usually snore loudly. They may have times when they gasp and stop breathing for 10 seconds or more during sleep. This may happen many times during the night. The breaks in breathing also interrupt the deep sleep that you need to feel rested. Even if you do not completely wake up from the gaps in breathing, your sleep may not be restful and you feel tired during the day. You may also have a headache in the morning and low energy during the day, and you may feel anxious or depressed. How can sleep apnea affect me? Sleep apnea increases your chances of extreme tiredness during the day (daytime fatigue). It can also increase your risk for health conditions, such as: Heart attack. Stroke. Obesity. Type 2 diabetes. Heart failure. Irregular heartbeat. High blood pressure. If you have daytime fatigue as a result of sleep apnea, you may be more likely to: Perform poorly at school or work. Fall asleep while driving. Have difficulty with attention. Develop depression or anxiety. Have sexual dysfunction. What actions can I take to manage sleep apnea? Sleep apnea treatment  If you were given a device to open your airway while you sleep, use it only as told by your health care provider. You may be given: An oral appliance. This is a custom-made mouthpiece that shifts your lower jaw forward. A continuous positive airway pressure (CPAP) device. This device blows air through a mask when you breathe out (exhale). A nasal expiratory positive airway pressure (EPAP) device. This device has valves that you put into each nostril. A bi-level positive airway pressure (BIPAP) device. This device blows air through a mask when you breathe in (inhale) and breathe out (exhale). You may need surgery if other treatments  do not work for you. Sleep habits Go to sleep and wake up at the same time every day. This helps set your internal clock (circadian rhythm) for sleeping. If you stay up later than usual, such as on weekends, try to get up in the morning within 2 hours of your normal wake time. Try to get at least 7-9 hours of sleep each night. Stop using a computer, tablet, and mobile phone a few hours before bedtime. Do not take long naps during the day. If you nap, limit it to 30 minutes. Have a relaxing bedtime routine. Reading or listening to music may relax you and help you sleep. Use your bedroom only for sleep. Keep your television and computer out of your bedroom. Keep your bedroom cool, dark, and quiet. Use a supportive mattress and pillows. Follow your health care provider's instructions for other changes to sleep habits. Nutrition Do not eat heavy meals in the evening. Do not have caffeine in the later part of the day. The effects of caffeine can last for more than 5 hours. Follow your health care provider's or dietitian's instructions for any diet changes. Lifestyle     Do not drink alcohol before bedtime. Alcohol can cause you to fall asleep at first, but then it can cause you to wake up in the middle of the night and have trouble getting back to sleep. Do not use any products that contain nicotine or tobacco. These products include cigarettes, chewing tobacco, and vaping devices, such as e-cigarettes. If you need help quitting, ask your health care provider. Medicines  Take over-the-counter and prescription medicines only as told by your health care provider. Do not use over-the-counter sleep medicine. You can become dependent on this medicine, and it can make sleep apnea worse. Do not use medicines, such as sedatives and narcotics, unless told by your health care provider. Activity Exercise on most days, but avoid exercising in the evening. Exercising near bedtime can interfere with  sleeping. If possible, spend time outside every day. Natural light helps regulate your circadian rhythm. General information Lose weight if you need to, and maintain a healthy weight. Keep all follow-up visits. This is important. If you are having surgery, make sure to tell your health care provider that you have sleep apnea. You may need to bring your device with you. Where to find more information Learn more about sleep apnea and daytime fatigue from: American Sleep Association: sleepassociation.org National Sleep Foundation: sleepfoundation.org National Heart, Lung, and Blood Institute: [employer] Summary Sleep apnea is a condition in which breathing pauses or becomes shallow during sleep. Sleep apnea can cause daytime fatigue and other serious health conditions. You may need to wear a device while sleeping to help keep your airway open. If you are having surgery, make sure to tell your health care provider that you have sleep apnea. You may need to bring your device with you. Making changes to sleep habits, diet, lifestyle, and activity can help you manage sleep apnea. This information is not intended to replace advice given to you by your health care provider. Make sure you discuss any questions you have with your health care provider. Document Revised: 03/08/2021 Document Reviewed: 07/08/2020 Elsevier Patient Education  2023 Elsevier Inc. Zolpidem Tablets What is this medication? ZOLPIDEM (zole PI dem) treats insomnia. It helps you get to sleep faster and stay asleep throughout the night. It is often used for a short period of time. This medicine may be used for other purposes; ask your health care provider or pharmacist if you have questions. COMMON BRAND NAME(S): Ambien What should I tell my care team before I take this medication? They need to know if you have any of these conditions: Depression Frequently drink alcohol Liver disease Lung or breathing disease Myasthenia  gravis Sleep apnea Substance use disorder Suicidal thoughts, plans, or attempt by you or a family member Unusual sleep behaviors or activities you do not remember An unusual or allergic reaction to zolpidem, other medications, foods, dyes, or preservatives Pregnant or trying to get pregnant Breastfeeding How should I use this medication? Take this medication by mouth with water. Take it as directed on the prescription label. It is better to take this medication on an empty stomach and only when you are ready for bed. Do not take your medication more often than directed. If you have been taking this medication for several weeks and suddenly stop taking it, you may get unpleasant withdrawal symptoms. Your care team may want to gradually reduce the dose. Do not stop taking this medication on your own. Always follow your care team's advice. A special MedGuide will be given to you by the pharmacist with each prescription and refill. Be sure to read this information carefully each time. Talk to your care team about the use of this medication in children. Special care may be needed. Overdosage: If you think you have taken too much of this medicine contact a poison control center or emergency room at once. NOTE: This medicine is only for you. Do not share this medicine with others. What if I miss  a dose? This does not apply. This medication should only be taken immediately before going to sleep. Do not take double or extra doses. What may interact with this medication? Alcohol Antihistamines for allergy, cough, and cold Certain medications for anxiety or sleep Certain medications for depression, such as amitriptyline, fluoxetine, sertraline Certain medications for fungal infections, such as ketoconazole and itraconazole Certain medications for seizures, such as phenobarbital, primidone Ciprofloxacin Dietary supplements for sleep, such as valerian or kava kava General anesthetics, such as halothane,  isoflurane, methoxyflurane, propofol Local anesthetics, such as lidocaine, pramoxine, tetracaine Medications that relax muscles for surgery Opioid medications for pain Phenothiazines, such as chlorpromazine, mesoridazine, prochlorperazine, thioridazine Rifampin This list may not describe all possible interactions. Give your health care provider a list of all the medicines, herbs, non-prescription drugs, or dietary supplements you use. Also tell them if you smoke, drink alcohol, or use illegal drugs. Some items may interact with your medicine. What should I watch for while using this medication? Visit your care team for regular checks on your progress. Keep a regular sleep schedule by going to bed at about the same time each night. Avoid caffeine-containing drinks in the evening hours. When sleep medications are used every night for more than a few weeks, they may stop working. Talk to your care team if you still have trouble sleeping. You may do unusual sleep behaviors or activities you do not remember the day after taking this medication. Activities include driving, making or eating food, talking on the phone, sexual activity, or sleep walking. Stop taking this medication and call your care team right away if you find out you have done activities like this. Plan to go to bed and stay in bed for a full night (7 to 8 hours) after you take this medication. You may still be drowsy the morning after taking this medication. This medication may affect your coordination, reaction time, or judgment. Do not drive or operate machinery until you know how this medication affects you. Sit up or stand slowly to reduce the risk of dizzy or fainting spells. If you or your family notice any changes in your behavior, such as new or worsening depression, thoughts of harming yourself, anxiety, other unusual or disturbing thoughts, or memory loss, call your care team right away. After you stop taking this medication, you may  have trouble falling asleep. This is called rebound insomnia. This problem usually goes away on its own after 1 or 2 nights. What side effects may I notice from receiving this medication? Side effects that you should report to your care team as soon as possible: Allergic reactions--skin rash, itching, hives, swelling of the face, lips, tongue, or throat Change in vision such as blurry vision, seeing halos around lights, vision loss CNS depression--slow or shallow breathing, shortness of breath, feeling faint, dizziness, confusion, difficulty staying awake Mood and behavior changes--anxiety, nervousness, confusion, hallucinations, irritability, hostility, thoughts of suicide or self-harm, worsening mood, feelings of depression Unusual sleep behaviors or activities you do not remember such as driving, eating, or sexual activity Side effects that usually do not require medical attention (report to your care team if they continue or are bothersome): Diarrhea Dizziness Drowsiness the day after use Headache This list may not describe all possible side effects. Call your doctor for medical advice about side effects. You may report side effects to FDA at 1-800-FDA-1088. Where should I keep my medication? Keep out of the reach of children and pets. This medication can be abused. Keep  your medication in a safe place to protect it from theft. Do not share this medication with anyone. Selling or giving away this medication is dangerous and against the law. Store at room temperature between 20 and 25 degrees C (68 and 77 degrees F). This medication may cause accidental overdose and death if taken by other adults, children, or pets. Mix any unused medication with a substance like cat litter or coffee grounds. Then throw the medication away in a sealed container like a sealed bag or a coffee can with a lid. Do not use the medication after the expiration date. NOTE: This sheet is a summary. It may not cover all  possible information. If you have questions about this medicine, talk to your doctor, pharmacist, or health care provider.  2024 Elsevier/Gold Standard (2022-07-29 00:00:00)

## 2023-06-24 NOTE — Progress Notes (Signed)
Established Sleep medicine Patient Office Visit:         Provider:  Melvyn Novas, MD  Primary Care Physician:  Catherine Ng, NP [address] [place] [address]     Referring Provider: Anne Ng, Np [address] [place],  [place] [address]          Chief Complaint according to patient   Patient presents with:               HISTORY OF PRESENT ILLNESS:  Catherine Baker is a 72 y.o. female patient who is here for revisit 06/24/2023 for CPAP compliance in OSA.  Chief concern according to patient :  " I just can't sleep without CPAP, I get good rest but I take 5 mg generic Ambien. "  I had the pleasure of seeing Catherine Baker today who is a meanwhile 72 year old African-American and right-handed female and a very compliant user of CPAP.  The patient's machine is a G3 AutoPap her compliance is 97% or 29 out of 30 days and she has used the machine for over 4 hours on all but 1 of these days.  The average time of use is 7 hours and 1 minute each night.  Her minimum pressure is set at 5 maximum pressure at 12 and the expiratory pressure relief is 3 cm water.  The machine starts ramping up from 4 cm baseline.  Her AHI or apnea hypopnea index is now only 1.1/h.  95th percentile pressure is 8 cm water.  The patient takes 5 mg of Ambien at nighttime to help her sleep she is also on Micardis, she took a Medrol dose pack recently but this is not a constant medication, Advil as needed, B complex and Lipitor.  Her Epworth sleepiness score was endorsed at 2 out of 24 points and her fatigue severity scale of 29 out of 63 points.  She does not feel depressed.  The geriatric depression score was only in 1 point out of 15 positive.  This was a question do you feel full of energy?  And apparently this is not yet may be the case.    11-01-2022: RV for Catherine Baker, who is meanwhile 44 - year- old  African American female patient and was originally seen in  CONSULTATION   requested by NP Nche. She reports she sleeps well on hr CPAP and low dose Ambien. She finds restorative and refreshing sleep. 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  She tried alternatives to Ambien, alternated between Trazodone and Ambien, it was not a good experience.  She has found a well fitting mask, a FFM I by [employer], sitting with a nasal cradle and she uses a silky sleeve for the headgear.  She is contemplating a nutritional consult, finding help with meal planning in her shift work situation.  The patient is 100% compliant CPAP user by days, she is followed by [facility].  Her CPAP was set up on 08-08-2021.  She has a ramp function starting at 4 cm water and minimum setting of 5 and a maximum setting of 12 cm water.  Her average AHI is 1.2/h which is an excellent resolution.  Her 95th percentile pressure is 7.5 cm well in the room of her current settings.  She does not have high air leaks at all so her mask is fitting her well.  On some days she will have a less than 4-hour use of time  because she worked the night shift.  Based on this I am very happy with her compliance and her response.  She reported the Epworth Sleepiness Scale at 1 out of 24 points and fatigue severity at 9 out of 63 points her geriatric depression score was endorsed at 1 out of 15 points.       Catherine Baker is a 52 - year- old  African American female patient and was seen in CONSULTATION   requested by NP Nche. She is now here on a RV on 09/26/2021 :  09-26-2021: Mrs. Catherine Baker underwent a HST while in reclined position- and had an AHI of 18/h, no hypoxia and bradycardia intermittently. She was ordered a CPAP, received a Luna machine- The data I can review today ranged from 27 August 2021 to 13 February 30 days   Review of Systems: Out of a complete 14 system review, the patient complains of only the following symptoms, and all other reviewed systems are negative.:  Fatigue, sleepiness ,  snoring, fragmented sleep, Insomnia, RLS, Nocturia   Night shift worker, still working as a Engineer, civil (consulting).    How likely are you to doze in the following situations: 0 = not likely, 1 = slight chance, 2 = moderate chance, 3 = high chance   Sitting and Reading? Watching Television? Sitting inactive in a public place (theater or meeting)? As a passenger in a car for an hour without a break? Lying down in the afternoon when circumstances permit? Sitting and talking to someone? Sitting quietly after lunch without alcohol? In a car, while stopped for a few minutes in traffic?   Total = 2/ 24 points   FSS endorsed at 29/ 63 points.   Social History   Socioeconomic History   Marital status: Legally Separated    Spouse name: Not on file   Number of children: 3   Years of education: 14   Highest education level: Not on file  Occupational History   Occupation: Engineer, civil (consulting), LPN nocturnal sitter and caretaker.   Tobacco Use   Smoking status: Former    Current packs/day: 0.00    Average packs/day: 2.0 packs/day for 11.0 years (22.0 ttl pk-yrs)    Types: Cigarettes    Start date: 11    Quit date: 24    Years since quitting: 34.8   Smokeless tobacco: Never  Vaping Use   Vaping status: Never Used  Substance and Sexual Activity   Alcohol use: Yes    Comment: occasional   Drug use: No   Sexual activity: Not on file  Other Topics Concern   Not on file  Social History Narrative   Fun: Dance, read, walk, music.    Denies any religious beliefs effecting healthcare.    Social Determinants of Health   Financial Resource Strain: Low Risk  (09/24/2022)   Overall Financial Resource Strain (CARDIA)    Difficulty of Paying Living Expenses: Not hard at all  Food Insecurity: No Food Insecurity (09/24/2022)   Hunger Vital Sign    Worried About Running Out of Food in the Last Year: Never true    Ran Out of Food in the Last Year: Never true  Transportation Needs: No Transportation Needs (09/24/2022)    PRAPARE - Administrator, Civil Service (Medical): No    Lack of Transportation (Non-Medical): No  Physical Activity: Sufficiently Active (09/24/2022)   Exercise Vital Sign    Days of Exercise per Week: 3 days    Minutes of Exercise per Session:  60 min  Stress: No Stress Concern Present (09/24/2022)   [employer] of Occupational Health - Occupational Stress Questionnaire    Feeling of Stress : Only a little  Social Connections: Moderately Integrated (09/06/2021)   Social Connection and Isolation Panel [NHANES]    Frequency of Communication with Friends and Family: More than three times a week    Frequency of Social Gatherings with Friends and Family: More than three times a week    Attends Religious Services: More than 4 times per year    Active Member of [employer] or Organizations: Yes    Attends Engineer, structural: More than 4 times per year    Marital Status: Divorced    Family History  Problem Relation Age of Onset   Hypertension Mother    Heart disease Mother    Hyperlipidemia Mother    Hypertension Father    Heart disease Father    Hyperlipidemia Father    Breast cancer Maternal Grandmother     Past Medical History:  Diagnosis Date   Adjustment disorder with mixed anxiety and depressed mood 12/09/2019   Diabetes mellitus without complication (HCC)    Hypertension     Past Surgical History:  Procedure Laterality Date   ABDOMINAL HYSTERECTOMY     TUBAL LIGATION       Current Outpatient Medications on File Prior to Visit  Medication Sig Dispense Refill   atorvastatin (LIPITOR) 20 MG tablet Take 1 tablet (20 mg total) by mouth 3 (three) times a week. 45 tablet 3   b complex vitamins tablet Take 1 tablet by mouth daily.     ibuprofen (ADVIL) 800 MG tablet Take 1 tablet (800 mg total) by mouth every 8 (eight) hours as needed for moderate pain. With food 30 tablet 0   telmisartan-hydrochlorothiazide (MICARDIS HCT) 80-25 MG tablet Take 1 tablet by  mouth once daily 90 tablet 0   zolpidem (AMBIEN) 5 MG tablet Take 1 tablet (5 mg total) by mouth at bedtime as needed. 30 tablet 2   Current Facility-Administered Medications on File Prior to Visit  Medication Dose Route Frequency Provider Last Rate Last Admin   ondansetron (ZOFRAN) 4 MG/5ML solution 4 mg  4 mg Oral Once Karie Georges, MD        Allergies  Allergen Reactions   Codeine Hives, Nausea Only and Nausea And Vomiting   Crestor [Rosuvastatin Calcium] Other (See Comments)    Myalgia and headache     DIAGNOSTIC DATA (LABS, IMAGING, TESTING) - I reviewed patient records, labs, notes, testing and imaging myself where available.  Lab Results  Component Value Date   WBC 7.5 02/20/2023   HGB 13.9 02/20/2023   HCT 42.3 02/20/2023   MCV 90.3 02/20/2023   PLT 357.0 02/20/2023      Component Value Date/Time   NA 137 02/20/2023 1038   K 3.9 02/20/2023 1038   CL 101 02/20/2023 1038   CO2 29 02/20/2023 1038   GLUCOSE 113 (H) 02/20/2023 1038   BUN 18 02/20/2023 1038   CREATININE 0.86 02/20/2023 1038   CREATININE 0.90 05/01/2019 1414   CALCIUM 10.3 02/20/2023 1038   PROT 7.7 02/20/2023 1038   ALBUMIN 4.0 02/20/2023 1038   AST 22 02/20/2023 1038   ALT 17 02/20/2023 1038   ALKPHOS 106 02/20/2023 1038   BILITOT 0.5 02/20/2023 1038   GFRNONAA >60 07/26/2015 2208   GFRAA >60 07/26/2015 2208   Lab Results  Component Value Date   CHOL 274 (H) 02/26/2023  HDL 60.80 02/26/2023   LDLCALC 195 (H) 02/26/2023   TRIG 93.0 02/26/2023   CHOLHDL 5 02/26/2023   Lab Results  Component Value Date   HGBA1C 6.3 02/26/2023   No results found for: "VITAMINB12" Lab Results  Component Value Date   TSH 4.05 02/26/2023    PHYSICAL EXAM:  Today's Vitals   06/24/23 1555  BP: (!) 140/73  Pulse: 67  Weight: 255 lb (115.7 kg)  Height: 5\' 7"  (1.702 m)   Body mass index is 39.94 kg/m.   Wt Readings from Last 3 Encounters:  06/24/23 255 lb (115.7 kg)  02/26/23 250 lb (113.4  kg)  11/01/22 258 lb (117 kg)     Ht Readings from Last 3 Encounters:  06/24/23 5\' 7"  (1.702 m)  02/26/23 5\' 10"  (1.778 m)  02/20/23 5\' 10"  (1.778 m)      General: The patient is awake, alert and appears not in acute distress. The patient is well groomed. Head: Normocephalic, atraumatic. Neck is supple. The patient is awake, alert and appears not in acute distress. The patient is well groomed. Head: Normocephalic, atraumatic. Neck is supple.  Mallampati 1- lateral restriction, like a curtain. ,  neck circumference:15.5  inches . Nasal airflow  patent.  Retrognathia is  seen.  Dental status: biological  Cardiovascular:  Regular rate and cardiac rhythm by pulse,  without distended neck veins. Respiratory: Lungs are clear to auscultation.  Skin:  Without evidence of ankle edema, or rash. Trunk: The patient's posture is erect.   NEUROLOGIC EXAM: The patient is awake and alert, oriented to place and time.   Memory subjective described as intact.  Attention span & concentration ability appears normal.  Speech is fluent,  without  dysarthria, dysphonia or aphasia.  Mood and affect are appropriate.   Cranial nerves: no loss of smell or taste reported  Pupils are equal and briskly reactive to light.  Funduscopic exam deferred..  Extraocular movements in vertical and horizontal planes were intact and without nystagmus. No Diplopia. Visual fields by finger perimetry are intact. Hearing was intact to soft voice and finger rubbing.    Facial sensation intact to fine touch.  Facial motor strength is symmetric and tongue and uvula move midline.  Neck ROM : rotation, tilt and flexion extension were normal for age and shoulder shrug was symmetrical.    Motor exam:  Symmetric bulk, tone and ROM.   Normal tone without cog wheeling, symmetric grip strength . She reports restricted ROM in the right knee, arthritis.   Gait and station: Patient could rise unassisted from a seated position, walked  without assistive device.  Stance is of normal width/ base .  Toe and heel walk were deferred.  Deep tendon reflexes: in the  upper and lower extremities are symmetric and intact.  Babinski response was deferred.  ASSESSMENT AND PLAN 72 y.o. year old female CPAP user  here with: confirmed OSA- with the known risk factors of obesity, narrow airway, but no witnessed apnea. Well controlled on CPAP, but overall use is now much above  4 hours.   2)   Circadian rhythm disorder -Shift work history which caused Insomnia, now dependent on Ambien. 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   2) she has been complaint with CPAP and medications- having gained control of BP and Cholesterol.   3)  risk factor for OSA is still present. BMI and upper airway anatomy.   I plan to follow up either personally or through our NP within 12 months.   I would like to thank  Nche, Bonna Gains, Np [address] [place],  [place] [address] for allowing me to meet with and to take care of this pleasant patient.   After spending a total time of  30  minutes face to face and additional time for physical and neurologic examination, review of laboratory studies,  personal review of imaging studies, reports and results of other testing and review of referral information / records as far as provided in visit,   Electronically signed by: Catherine Novas, MD 06/24/2023 4:20 PM  [facility] Board certified by The [employer] of Sleep Medicine and Diplomate of the [employer] of Sleep Medicine. Board certified In Neurology through the ABPN, Fellow of the [employer] of Neu[employer].

## 2023-06-26 LAB — COLOGUARD: COLOGUARD: NEGATIVE

## 2023-07-24 DIAGNOSIS — G4733 Obstructive sleep apnea (adult) (pediatric): Secondary | ICD-10-CM | POA: Diagnosis not present

## 2023-08-16 ENCOUNTER — Encounter: Payer: Self-pay | Admitting: Nurse Practitioner

## 2023-08-18 ENCOUNTER — Other Ambulatory Visit: Payer: Self-pay | Admitting: Nurse Practitioner

## 2023-08-18 DIAGNOSIS — I10 Essential (primary) hypertension: Secondary | ICD-10-CM

## 2023-09-04 ENCOUNTER — Other Ambulatory Visit: Payer: Self-pay | Admitting: Nurse Practitioner

## 2023-09-04 DIAGNOSIS — I10 Essential (primary) hypertension: Secondary | ICD-10-CM

## 2023-09-10 ENCOUNTER — Other Ambulatory Visit: Payer: Self-pay | Admitting: Nurse Practitioner

## 2023-09-10 DIAGNOSIS — I10 Essential (primary) hypertension: Secondary | ICD-10-CM

## 2023-09-10 NOTE — Telephone Encounter (Signed)
Copied from CRM [id number]. Topic: Clinical - Medication Refill >> Sep 10, 2023  9:59 AM Myrtice Lauth wrote: Most Recent Primary Care Visit:  Provider: Anne Ng  Department: [facility]  Visit Type: OFFICE VISIT  Date: 02/26/2023  Medication: telmisartan-hydrochlorothiazide (MICARDIS HCT) 80-25 MG tablet   Has the patient contacted their pharmacy? Yes (Agent: If no, request that the patient contact the pharmacy for the refill. If patient does not wish to contact the pharmacy document the reason why and proceed with request.) (Agent: If yes, when and what did the pharmacy advise?)  Is this the correct pharmacy for this prescription? Yes If no, delete pharmacy and type the correct one.  This is the patient's preferred pharmacy:  [facility] [address] [place] - [address] [place] [address] Phone: [phone] Fax: [phone]   Has the prescription been filled recently? Yes  Is the patient out of the medication? Yes  Has the patient been seen for an appointment in the last year OR does the patient have an upcoming appointment? Yes  Can we respond through MyChart? Yes  Agent: Please be advised that Rx refills may take up to 3 business days. We ask that you follow-up with your pharmacy.

## 2023-09-13 DIAGNOSIS — M170 Bilateral primary osteoarthritis of knee: Secondary | ICD-10-CM | POA: Diagnosis not present

## 2023-10-14 ENCOUNTER — Encounter: Payer: Self-pay | Admitting: Nurse Practitioner

## 2023-10-14 ENCOUNTER — Ambulatory Visit (INDEPENDENT_AMBULATORY_CARE_PROVIDER_SITE_OTHER): Payer: PPO | Admitting: Nurse Practitioner

## 2023-10-14 VITALS — BP 132/78 | HR 68 | Temp 97.9°F | Ht 67.0 in | Wt 260.8 lb

## 2023-10-14 DIAGNOSIS — E039 Hypothyroidism, unspecified: Secondary | ICD-10-CM

## 2023-10-14 DIAGNOSIS — Z7985 Long-term (current) use of injectable non-insulin antidiabetic drugs: Secondary | ICD-10-CM | POA: Diagnosis not present

## 2023-10-14 DIAGNOSIS — E1165 Type 2 diabetes mellitus with hyperglycemia: Secondary | ICD-10-CM | POA: Diagnosis not present

## 2023-10-14 DIAGNOSIS — E785 Hyperlipidemia, unspecified: Secondary | ICD-10-CM

## 2023-10-14 DIAGNOSIS — Z0001 Encounter for general adult medical examination with abnormal findings: Secondary | ICD-10-CM

## 2023-10-14 DIAGNOSIS — Z0000 Encounter for general adult medical examination without abnormal findings: Secondary | ICD-10-CM

## 2023-10-14 DIAGNOSIS — E669 Obesity, unspecified: Secondary | ICD-10-CM | POA: Insufficient documentation

## 2023-10-14 DIAGNOSIS — E1169 Type 2 diabetes mellitus with other specified complication: Secondary | ICD-10-CM | POA: Diagnosis not present

## 2023-10-14 DIAGNOSIS — G720 Drug-induced myopathy: Secondary | ICD-10-CM | POA: Insufficient documentation

## 2023-10-14 DIAGNOSIS — I10 Essential (primary) hypertension: Secondary | ICD-10-CM

## 2023-10-14 LAB — HEMOGLOBIN A1C: Hgb A1c MFr Bld: 6.4 % (ref 4.6–6.5)

## 2023-10-14 LAB — T4, FREE: Free T4: 0.86 ng/dL (ref 0.60–1.60)

## 2023-10-14 LAB — TSH: TSH: 2.76 u[IU]/mL (ref 0.35–5.50)

## 2023-10-14 MED ORDER — TIRZEPATIDE 2.5 MG/0.5ML ~~LOC~~ SOAJ: 2.5000 mg | SUBCUTANEOUS | 0 refills | Status: DC

## 2023-10-14 MED ORDER — EZETIMIBE 10 MG PO TABS: 10.0000 mg | ORAL_TABLET | Freq: Every day | ORAL | 1 refills | Status: DC

## 2023-10-14 MED ORDER — TELMISARTAN-HCTZ 80-25 MG PO TABS: 1.0000 | ORAL_TABLET | Freq: Every day | ORAL | 1 refills | Status: DC

## 2023-10-14 NOTE — Assessment & Plan Note (Addendum)
 Unable to tolerate metformin in the past- severe diarrhea 10lbs weight gain in last 6months due to increased appetite. Normal foot exam Advised to schedule DIABETES eye exam and dental cleaning. LDL not at goal-statin intolerance  We discussed use of GLP-1RA, possible side effects and contraindications. She verbalized understanding and agreed to start medication. Advised about the importance of diet modifications, adequate oral hydration and exercise to minimize GI side effects She declined referral to nutritionist at this time. Mounjaro sent Repeat UACr, lipid anel, CMP F/up in 45month

## 2023-10-14 NOTE — Assessment & Plan Note (Signed)
 repeat TSH and T4 Maintain med dose

## 2023-10-14 NOTE — Assessment & Plan Note (Signed)
 BP at goal with micardis hct BP Readings from Last 3 Encounters:  10/14/23 132/78  06/24/23 (!) 140/73  02/26/23 118/80    Normal CMP Maintain med dose

## 2023-10-14 NOTE — Assessment & Plan Note (Addendum)
 Unable to tolerate pravastatin, crestor and atorvastatin even at low doses. Reports myalgia.  Repeat lipid panel Start zetia Consider ref to [facility] if no improvement

## 2023-10-14 NOTE — Progress Notes (Signed)
 Complete physical exam  Patient: Catherine Baker   DOB: February 22, 1951   73 y.o. Female  MRN: [id number] Visit Date: 10/14/2023  Subjective:    Chief Complaint  Patient presents with   Annual Exam    Annual Exam and medication refill on Micardis    Catherine Baker is a 73 y.o. female who presents today for a complete physical exam. She reports consuming a general diet.  3x/week, water aerobics  She generally feels well. She reports sleeping well. She does have additional problems to discuss today.  Vision:No Dental:No STD Screen:No  BP Readings from Last 3 Encounters:  10/14/23 132/78  06/24/23 (!) 140/73  02/26/23 118/80   Wt Readings from Last 3 Encounters:  10/14/23 260 lb 12.8 oz (118.3 kg)  06/24/23 255 lb (115.7 kg)  02/26/23 250 lb (113.4 kg)   Most recent fall risk assessment:    10/14/2023    1:20 PM  Fall Risk   Falls in the past year? 0  Number falls in past yr: 0  Injury with Fall? 0  Risk for fall due to : No Fall Risks  Follow up Falls evaluation completed;Falls prevention discussed   Depression screen:Yes - No Depression  Most recent depression screenings:    10/14/2023    1:20 PM 02/26/2023    9:25 AM  PHQ 2/9 Scores  PHQ - 2 Score 0 0  PHQ- 9 Score 0 0   HPI  Type 2 diabetes mellitus Unable to tolerate metformin in the past- severe diarrhea 10lbs weight gain in last 6months due to increased appetite. Normal foot exam Advised to schedule DIABETES eye exam and dental cleaning. LDL not at goal-statin intolerance  We discussed use of GLP-1RA, possible side effects and contraindications. She verbalized understanding and agreed to start medication. Advised about the importance of diet modifications, adequate oral hydration and exercise to minimize GI side effects She declined referral to nutritionist at this time. Mounjaro sent Repeat UACr, lipid anel, CMP F/up in 70month  Essential hypertension BP at goal with micardis hct BP Readings from Last 3  Encounters:  10/14/23 132/78  06/24/23 (!) 140/73  02/26/23 118/80    Normal CMP Maintain med dose  Hyperlipidemia associated with type 2 diabetes mellitus (HCC) Unable to tolerate pravastatin, crestor and atorvastatin even at low doses. Reports myalgia.  Repeat lipid panel Start zetia Consider ref to [facility] if no improvement  Hypothyroidism repeat TSH and T4 Maintain med dose  Past Medical History:  Diagnosis Date   Adjustment disorder with mixed anxiety and depressed mood 12/09/2019   Diabetes mellitus without complication (HCC)    Hypertension    Past Surgical History:  Procedure Laterality Date   ABDOMINAL HYSTERECTOMY     TUBAL LIGATION     Social History   Socioeconomic History   Marital status: Legally Separated    Spouse name: Not on file   Number of children: 3   Years of education: 14   Highest education level: Not on file  Occupational History   Occupation: Nurse  Tobacco Use   Smoking status: Former    Current packs/day: 0.00    Average packs/day: 2.0 packs/day for 11.0 years (22.0 ttl pk-yrs)    Types: Cigarettes    Start date: 71    Quit date: 1990    Years since quitting: 35.1   Smokeless tobacco: Never  Vaping Use   Vaping status: Never Used  Substance and Sexual Activity   Alcohol use: Yes  Comment: occasional   Drug use: No   Sexual activity: Not on file  Other Topics Concern   Not on file  Social History Narrative   Fun: Dance, read, walk, music.    Denies any religious beliefs effecting healthcare.    Social Drivers of Corporate investment banker Strain: Low Risk  (09/24/2022)   Overall Financial Resource Strain (CARDIA)    Difficulty of Paying Living Expenses: Not hard at all  Food Insecurity: No Food Insecurity (09/24/2022)   Hunger Vital Sign    Worried About Running Out of Food in the Last Year: Never true    Ran Out of Food in the Last Year: Never true  Transportation Needs: No Transportation Needs  (09/24/2022)   PRAPARE - Administrator, Civil Service (Medical): No    Lack of Transportation (Non-Medical): No  Physical Activity: Sufficiently Active (09/24/2022)   Exercise Vital Sign    Days of Exercise per Week: 3 days    Minutes of Exercise per Session: 60 min  Stress: No Stress Concern Present (09/24/2022)   [employer] of Occupational Health - Occupational Stress Questionnaire    Feeling of Stress : Only a little  Social Connections: Moderately Integrated (09/06/2021)   Social Connection and Isolation Panel [NHANES]    Frequency of Communication with Friends and Family: More than three times a week    Frequency of Social Gatherings with Friends and Family: More than three times a week    Attends Religious Services: More than 4 times per year    Active Member of [employer] or Organizations: Yes    Attends Engineer, structural: More than 4 times per year    Marital Status: Divorced  Intimate Partner Violence: Not At Risk (09/06/2021)   Humiliation, Afraid, Rape, and Kick questionnaire    Fear of Current or Ex-Partner: No    Emotionally Abused: No    Physically Abused: No    Sexually Abused: No   Family Status  Relation Name Status   Mother  Deceased   Father  Deceased   MGM  (Not Specified)  No partnership data on file   Family History  Problem Relation Age of Onset   Hypertension Mother    Heart disease Mother    Hyperlipidemia Mother    Hypertension Father    Heart disease Father    Hyperlipidemia Father    Breast cancer Maternal Grandmother    Allergies  Allergen Reactions   Codeine Hives, Nausea Only and Nausea And Vomiting   Crestor [Rosuvastatin Calcium] Other (See Comments)    Myalgia and headache    Patient Care Team: Kolten Ryback, Bonna Gains, NP as PCP - General (Internal Medicine) [facility]   Medications: Outpatient Medications Prior to Visit  Medication Sig   b complex vitamins tablet Take 1 tablet by mouth daily.    diclofenac Sodium (VOLTAREN) 1 % GEL Apply 2 g topically as needed (arthritis knee pain).   ibuprofen (ADVIL) 800 MG tablet Take 1 tablet (800 mg total) by mouth every 8 (eight) hours as needed for moderate pain. With food   zolpidem (AMBIEN) 5 MG tablet Take 1 tablet (5 mg total) by mouth at bedtime as needed.   [DISCONTINUED] telmisartan-hydrochlorothiazide (MICARDIS HCT) 80-25 MG tablet Take 1 tablet by mouth once daily   [DISCONTINUED] atorvastatin (LIPITOR) 20 MG tablet Take 1 tablet (20 mg total) by mouth 3 (three) times a week. (Patient not taking: Reported on 10/14/2023)   Facility-Administered Medications  Prior to Visit  Medication Dose Route Frequency Provider   ondansetron [facility]) 4 MG/5ML solution 4 mg  4 mg Oral Once Karie Georges, MD    Review of Systems  Constitutional:  Negative for activity change, appetite change and unexpected weight change.  Respiratory: Negative.    Cardiovascular: Negative.   Gastrointestinal: Negative.   Endocrine: Negative for cold intolerance and heat intolerance.  Genitourinary: Negative.   Musculoskeletal: Negative.   Skin: Negative.   Neurological: Negative.   Hematological: Negative.   Psychiatric/Behavioral:  Negative for behavioral problems, decreased concentration, dysphoric mood, hallucinations, self-injury, sleep disturbance and suicidal ideas. The patient is not nervous/anxious.         Objective:  BP 132/78 (BP Location: Left Arm, Patient Position: Sitting)   Pulse 68   Temp 97.9 F (36.6 C) (Temporal)   Ht 5\' 7"  (1.702 m)   Wt 260 lb 12.8 oz (118.3 kg)   SpO2 98%   BMI 40.85 kg/m     Physical Exam Vitals and nursing note reviewed.  Constitutional:      General: She is not in acute distress. HENT:     Right Ear: Tympanic membrane, ear canal and external ear normal.     Left Ear: Tympanic membrane, ear canal and external ear normal.     Nose: Nose normal.  Eyes:     Extraocular Movements: Extraocular movements  intact.     Conjunctiva/sclera: Conjunctivae normal.     Pupils: Pupils are equal, round, and reactive to light.  Neck:     Thyroid: No thyroid mass, thyromegaly or thyroid tenderness.  Cardiovascular:     Rate and Rhythm: Normal rate and regular rhythm.     Pulses: Normal pulses.          Posterior tibial pulses are 2+ on the right side and 2+ on the left side.     Heart sounds: Normal heart sounds.  Pulmonary:     Effort: Pulmonary effort is normal.     Breath sounds: Normal breath sounds.  Abdominal:     General: Bowel sounds are normal.     Palpations: Abdomen is soft.  Musculoskeletal:        General: Normal range of motion.     Cervical back: Normal range of motion and neck supple.     Right lower leg: No edema.     Left lower leg: No edema.     Right foot: Normal range of motion. Bunion present.     Left foot: Normal range of motion. Bunion present.  Feet:     Right foot:     Protective Sensation: 6 sites tested.  6 sites sensed.     Skin integrity: Skin integrity normal.     Toenail Condition: Right toenails are normal.     Left foot:     Protective Sensation: 6 sites tested.  6 sites sensed.     Skin integrity: Skin integrity normal.     Toenail Condition: Left toenails are normal.  Lymphadenopathy:     Cervical: No cervical adenopathy.  Skin:    General: Skin is warm and dry.  Neurological:     Mental Status: She is alert and oriented to person, place, and time.     Cranial Nerves: No cranial nerve deficit.  Psychiatric:        Mood and Affect: Mood normal.        Behavior: Behavior normal.        Thought Content: Thought content normal.  No results found for any visits on 10/14/23.    Assessment & Plan:    Routine Health Maintenance and Physical Exam  Immunization History  Administered Date(s) Administered   Influenza-Unspecified 06/10/2023   PFIZER(Purple Top)SARS-COV-2 Vaccination 04/22/2020, 05/13/2020   Health Maintenance  Topic Date Due    OPHTHALMOLOGY EXAM  09/13/2016   Diabetic kidney evaluation - Urine ACR  10/03/2019   MAMMOGRAM  12/26/2022   COVID-19 Vaccine (3 - 2024-25 season) 04/14/2023   HEMOGLOBIN A1C  08/29/2023   Medicare Annual Wellness (AWV)  09/25/2023   Zoster Vaccines- Shingrix (1 of 2) 01/14/2024 (Originally 11/02/2002)   DTaP/Tdap/Td (1 - Tdap) 10/13/2024 (Originally 11/02/1971)   Pneumonia Vaccine 30+ Years old (1 of 2 - PCV) 10/13/2024 (Originally 11/02/1958)   Diabetic kidney evaluation - eGFR measurement  02/20/2024   FOOT EXAM  10/13/2024   Fecal DNA (Cologuard)  06/17/2026   INFLUENZA VACCINE  Completed   DEXA SCAN  Completed   Hepatitis C Screening  Completed   HPV VACCINES  Aged Out   Discussed health benefits of physical activity, and encouraged her to engage in regular exercise appropriate for her age and condition.  Problem List Items Addressed This Visit     Essential hypertension   BP at goal with micardis hct BP Readings from Last 3 Encounters:  10/14/23 132/78  06/24/23 (!) 140/73  02/26/23 118/80    Normal CMP Maintain med dose      Relevant Medications   telmisartan-hydrochlorothiazide (MICARDIS HCT) 80-25 MG tablet   ezetimibe (ZETIA) 10 MG tablet   Hyperlipidemia associated with type 2 diabetes mellitus (HCC)   Unable to tolerate pravastatin, crestor and atorvastatin even at low doses. Reports myalgia.  Repeat lipid panel Start zetia Consider ref to advance lipid clinic if no improvement      Relevant Medications   tirzepatide (MOUNJARO) 2.5 MG/0.5ML Pen   telmisartan-hydrochlorothiazide (MICARDIS HCT) 80-25 MG tablet   ezetimibe (ZETIA) 10 MG tablet   Other Relevant Orders   Comprehensive metabolic panel   Lipid panel   Hypothyroidism   repeat TSH and T4 Maintain med dose      Relevant Orders   T4, free   TSH   Morbid obesity (HCC)   Relevant Medications   tirzepatide (MOUNJARO) 2.5 MG/0.5ML Pen   Statin myopathy   Type 2 diabetes mellitus (HCC)    Unable to tolerate metformin in the past- severe diarrhea 10lbs weight gain in last 6months due to increased appetite. Normal foot exam Advised to schedule DIABETES eye exam and dental cleaning. LDL not at goal-statin intolerance  We discussed use of GLP-1RA, possible side effects and contraindications. She verbalized understanding and agreed to start medication. Advised about the importance of diet modifications, adequate oral hydration and exercise to minimize GI side effects She declined referral to nutritionist at this time. Mounjaro sent Repeat UACr, lipid anel, CMP F/up in 80month      Relevant Medications   tirzepatide (MOUNJARO) 2.5 MG/0.5ML Pen   telmisartan-hydrochlorothiazide (MICARDIS HCT) 80-25 MG tablet   Other Relevant Orders   Hemoglobin A1c   Microalbumin / creatinine urine ratio   Comprehensive metabolic panel   Other Visit Diagnoses       Encounter for preventative adult health care exam with abnormal findings    -  Primary      Return in about 4 weeks (around 11/11/2023) for DM, hyperlipidemia (fasting).     Alysia Penna, NP

## 2023-10-14 NOTE — Patient Instructions (Addendum)
 Go to lab Maintain Heart healthy diet and daily exercise. Maintain current medications. Schedule appointment for annual mammogram and DIABETES eye exam  Mediterranean Diet A Mediterranean diet is based on the traditions of countries on the [employer]. It focuses on eating more: Fruits and vegetables. Whole grains, beans, nuts, and seeds. Heart-healthy fats. These are fats that are good for your heart. It involves eating less: Dairy. Meat and eggs. Processed foods with added sugar, salt, and fat. This type of diet can help prevent certain conditions. It can also improve outcomes if you have a long-term (chronic) disease, such as kidney or heart disease. What are tips for following this plan? Reading food labels Check packaged foods for: The serving size. For foods such as rice and pasta, the serving size is the amount of cooked product, not dry. The total fat. Avoid foods with saturated fat or trans fat. Added sugars, such as corn syrup. Shopping  Try to have a balanced diet. Buy a variety of foods, such as: Fresh fruits and vegetables. You may be able to get these from local farmers markets. You can also buy them frozen. Grains, beans, nuts, and seeds. Some of these can be bought in bulk. Fresh seafood. Poultry and eggs. Low-fat dairy products. Buy whole ingredients instead of foods that have already been packaged. If you can't get fresh seafood, buy precooked frozen shrimp or canned fish, such as tuna, salmon, or sardines. Stock your pantry so you always have certain foods on hand, such as olive oil, canned tuna, canned tomatoes, rice, pasta, and beans. Cooking Cook foods with extra-virgin olive oil instead of using butter or other vegetable oils. Have meat as a side dish. Have vegetables or grains as your main dish. This means having meat in small portions or adding small amounts of meat to foods like pasta or stew. Use beans or vegetables instead of meat in common dishes  like chili or lasagna. Try out different cooking methods. Try roasting, broiling, steaming, and sauting vegetables. Add frozen vegetables to soups, stews, pasta, or rice. Add nuts or seeds for added healthy fats and plant protein at each meal. You can add these to yogurt, salads, or vegetable dishes. Marinate fish or vegetables using olive oil, lemon juice, garlic, and fresh herbs. Meal planning Plan to eat a vegetarian meal one day each week. Try to work up to two vegetarian meals, if possible. Eat seafood two or more times a week. Have healthy snacks on hand. These may include: Vegetable sticks with hummus. Greek yogurt. Fruit and nut trail mix. Eat balanced meals. These should include: Fruit: 2-3 servings a day. Vegetables: 4-5 servings a day. Low-fat dairy: 2 servings a day. Fish, poultry, or lean meat: 1 serving a day. Beans and legumes: 2 or more servings a week. Nuts and seeds: 1-2 servings a day. Whole grains: 6-8 servings a day. Extra-virgin olive oil: 3-4 servings a day. Limit red meat and sweets to just a few servings a month. Lifestyle  Try to cook and eat meals with your family. Drink enough fluid to keep your pee (urine) pale yellow. Be active every day. This includes: Aerobic exercise, which is exercise that causes your heart to beat faster. Examples include running and swimming. Leisure activities like gardening, walking, or housework. Get 7-8 hours of sleep each night. Drink red wine if your provider says you can. A glass of wine is 5 oz (150 mL). You may be allowed to have: Up to 1 glass a day if  you're female and not pregnant. Up to 2 glasses a day if you're female. What foods should I eat? Fruits Apples. Apricots. Avocado. Berries. Bananas. Cherries. Dates. Figs. Grapes. Lemons. Melon. Oranges. Peaches. Plums. Pomegranate. Vegetables Artichokes. Beets. Broccoli. Cabbage. Carrots. Eggplant. Green beans. Chard. Kale. Spinach. Onions. Leeks. Peas. Squash.  Tomatoes. Peppers. Radishes. Grains Whole-grain pasta. Brown rice. Bulgur wheat. Polenta. Couscous. Whole-wheat bread. Orpah Cobb. Meats and other proteins Beans. Almonds. Sunflower seeds. Pine nuts. Peanuts. Cod. Salmon. Scallops. Shrimp. Tuna. Tilapia. Clams. Oysters. Eggs. Chicken or [place] without skin. Dairy Low-fat milk. Cheese. Greek yogurt. Fats and oils Extra-virgin olive oil. Avocado oil. Grapeseed oil. Beverages Water. Red wine. Herbal tea. Sweets and desserts Greek yogurt with honey. Baked apples. Poached pears. Trail mix. Seasonings and condiments Basil. Cilantro. Coriander. Cumin. Mint. Parsley. Sage. Rosemary. Tarragon. Garlic. Oregano. Thyme. Pepper. Balsamic vinegar. Tahini. Hummus. Tomato sauce. Olives. Mushrooms. The items listed above may not be all the foods and drinks you can have. Talk to a dietitian to learn more. What foods should I limit? This is a list of foods that should be eaten rarely. Fruits Fruit canned in syrup. Vegetables Deep-fried potatoes, like [place] fries. Grains Packaged pasta or rice dishes. Cereal with added sugar. Snacks with added sugar. Meats and other proteins Beef. Pork. Lamb. Chicken or [place] with skin. Hot dogs. Tomasa Blase. Dairy Ice cream. Sour cream. Whole milk. Fats and oils Butter. Canola oil. Vegetable oil. Beef fat (tallow). Lard. Beverages Juice. Sugar-sweetened soft drinks. Beer. Liquor and spirits. Sweets and desserts Cookies. Cakes. Pies. Candy. Seasonings and condiments Mayonnaise. Pre-made sauces and marinades. The items listed above may not be all the foods and drinks you should limit. Talk to a dietitian to learn more. Where to find more information American Heart Association (AHA): heart.org This information is not intended to replace advice given to you by your health care provider. Make sure you discuss any questions you have with your health care provider. Document Revised: 11/11/2022 Document Reviewed:  11/11/2022 Elsevier Patient Education  2024 [employer].

## 2023-10-15 ENCOUNTER — Encounter: Payer: Self-pay | Admitting: Nurse Practitioner

## 2023-10-15 LAB — COMPREHENSIVE METABOLIC PANEL
ALT: 15 U/L (ref 0–35)
AST: 18 U/L (ref 0–37)
Albumin: 3.7 g/dL (ref 3.5–5.2)
Alkaline Phosphatase: 103 U/L (ref 39–117)
BUN: 14 mg/dL (ref 6–23)
CO2: 27 meq/L (ref 19–32)
Calcium: 9.2 mg/dL (ref 8.4–10.5)
Chloride: 104 meq/L (ref 96–112)
Creatinine, Ser: 0.81 mg/dL (ref 0.40–1.20)
GFR: 73.28 mL/min (ref >60.00)
Glucose, Bld: 95 mg/dL (ref 70–99)
Potassium: 3.9 meq/L (ref 3.5–5.1)
Sodium: 140 meq/L (ref 135–145)
Total Bilirubin: 0.5 mg/dL (ref 0.2–1.2)
Total Protein: 6.9 g/dL (ref 6.0–8.3)

## 2023-10-15 LAB — LIPID PANEL
Cholesterol: 237 mg/dL — ABNORMAL HIGH (ref 0–200)
HDL: 59.4 mg/dL (ref >39.00)
LDL Cholesterol: 160 mg/dL — ABNORMAL HIGH (ref 0–99)
NonHDL: 177.95
Total CHOL/HDL Ratio: 4
Triglycerides: 89 mg/dL (ref 0.0–149.0)
VLDL: 17.8 mg/dL (ref 0.0–40.0)

## 2023-10-15 LAB — MICROALBUMIN / CREATININE URINE RATIO
Creatinine,U: 192.8 mg/dL
Microalb Creat Ratio: 3.6 mg/g (ref 0.0–30.0)
Microalb, Ur: <0.7 mg/dL (ref 0.0–1.9)

## 2023-10-15 NOTE — Addendum Note (Signed)
 Addended by: Michaela Corner on: 10/15/2023 02:35 PM   Modules accepted: Orders

## 2023-10-16 ENCOUNTER — Other Ambulatory Visit (HOSPITAL_COMMUNITY): Payer: Self-pay

## 2023-10-16 ENCOUNTER — Telehealth: Payer: Self-pay

## 2023-10-16 DIAGNOSIS — Z0279 Encounter for issue of other medical certificate: Secondary | ICD-10-CM

## 2023-10-16 NOTE — Telephone Encounter (Signed)
 Pharmacy Patient Advocate Encounter   Received notification from Pt Calls Messages that prior authorization for Mounjaro 2.5mg /0.34ml is required/requested.   Insurance verification completed.   The patient is insured through [facility] ADVANTAGE/RX ADVANCE .   Per test claim: PA required; PA submitted to above mentioned insurance via CoverMyMeds Key/confirmation #/EOC [id number] Status is pending

## 2023-10-16 NOTE — Telephone Encounter (Signed)
 Pharmacy Patient Advocate Encounter  Received notification from [facility] ADVANTAGE/RX ADVANCE that Prior Authorization for [facility] 2.5mg /0.66ml has been APPROVED from 10/16/23 to 10/15/24   PA #/Case ID/Reference #: [id number]

## 2023-10-16 NOTE — Telephone Encounter (Signed)
 PA request received from patient's pharmacy for [facility] 2.5/0.5

## 2023-10-17 ENCOUNTER — Telehealth: Payer: Self-pay

## 2023-10-17 NOTE — Telephone Encounter (Signed)
 Faxed completed and signed FMLA paperwork to [employer]. Placed a copy in my drawer and placed originals with attached charge form in the box in [facility] office.

## 2023-10-25 DIAGNOSIS — G4733 Obstructive sleep apnea (adult) (pediatric): Secondary | ICD-10-CM | POA: Diagnosis not present

## 2023-10-31 ENCOUNTER — Encounter: Attending: Nurse Practitioner | Admitting: Dietician

## 2023-10-31 VITALS — Ht 67.0 in | Wt 255.0 lb

## 2023-10-31 DIAGNOSIS — E1165 Type 2 diabetes mellitus with hyperglycemia: Secondary | ICD-10-CM

## 2023-11-01 ENCOUNTER — Encounter: Payer: Self-pay | Admitting: Dietician

## 2023-11-01 NOTE — Progress Notes (Signed)
 Patient was seen on 10/31/2023 for the first of a series of three diabetes self-management courses at [facility]  Patient Education Plan per assessed needs and concerns is to attend three course education program for Diabetes Self Management Education.  A1C was 6.3% on 10/14/2023 with highest A1C of 6.5% per patient.  The following learning objectives were met by the patient during this class: Describe diabetes, types of diabetes and pathophysiology State some common risk factors for diabetes Defines the role of glucose and insulin Describe the relationship between diabetes and cardiovascular and other risks State the members of the Healthcare Team States the rationale for glucose monitoring and when to test State their individual Target Range State the importance of logging glucose readings and how to interpret the readings Identifies A1C target Explain the correlation between A1c and eAG values State symptoms and treatment of high blood glucose and low blood glucose Explain proper technique for glucose testing and identify proper sharps disposal  Blood glucose provided to patient:  One Touch Verio Flex Lot [id number] x Expiration 09/13/2027 Blood glucose 110  Handouts given during class include: How to Thrive:  A Guide for Your Journey with Diabetes by the ADA Meal Plan Card and carbohydrate content list Dietary intake form Low Sodium Flavoring Tips Types of Fats Dining Out Label reading Snack list The diabetes portion plate Diabetes Resources A1c to eAG Conversion Chart Blood Glucose Log Diabetes Recommended Care Schedule Support Group Diabetes Success Plan Core Class Satisfaction Survey   Follow-Up Plan: Attend core 2

## 2023-11-07 ENCOUNTER — Encounter: Admitting: Dietician

## 2023-11-07 DIAGNOSIS — E1165 Type 2 diabetes mellitus with hyperglycemia: Secondary | ICD-10-CM | POA: Diagnosis not present

## 2023-11-08 ENCOUNTER — Encounter: Payer: Self-pay | Admitting: Dietician

## 2023-11-08 NOTE — Progress Notes (Signed)
 Patient was seen on 11/07/2023 for the second of a series of three diabetes self-management courses at the [facility]. The following learning objectives were met by the patient during this class:  Describe the role of different macronutrients on glucose Explain how carbohydrates affect blood glucose State what foods contain the most carbohydrates Demonstrate carbohydrate counting Demonstrate how to read Nutrition Facts food label Describe effects of various fats on heart health Describe the importance of good nutrition for health and healthy eating strategies Describe techniques for managing your shopping, cooking and meal planning List strategies to follow meal plan when dining out Describe the effects of alcohol on glucose and how to use it safely  Goals:  Follow Diabetes Meal Plan as instructed  Aim to spread carbs evenly throughout the day  Aim for 3 meals per day and snacks as needed Include lean protein foods to meals/snacks  Monitor glucose levels as instructed by your doctor   Follow-Up Plan: Attend Core 3 Work towards following your personal food plan.

## 2023-11-12 ENCOUNTER — Other Ambulatory Visit: Payer: Self-pay | Admitting: Nurse Practitioner

## 2023-11-12 DIAGNOSIS — E1165 Type 2 diabetes mellitus with hyperglycemia: Secondary | ICD-10-CM

## 2023-11-12 DIAGNOSIS — E785 Hyperlipidemia, unspecified: Secondary | ICD-10-CM

## 2023-11-14 ENCOUNTER — Ambulatory Visit

## 2023-11-14 NOTE — Telephone Encounter (Signed)
 Medication: Tirezpatide (Mounjaro) 2.5 mg/0.5 mL Directions: Inject 0.5 mL subcutaneously once a week Last given: 10/14/23 Number refills: 0 Last o/v: 10/14/23 Follow up: 4 weeks (around 11/11/23)-11/22/23 Labs: 10/14/23

## 2023-11-18 ENCOUNTER — Ambulatory Visit (INDEPENDENT_AMBULATORY_CARE_PROVIDER_SITE_OTHER): Payer: PPO

## 2023-11-18 DIAGNOSIS — Z0000 Encounter for general adult medical examination without abnormal findings: Secondary | ICD-10-CM | POA: Diagnosis not present

## 2023-11-18 NOTE — Patient Instructions (Signed)
 Catherine Baker , Thank you for taking time to come for your Medicare Wellness Visit. I appreciate your ongoing commitment to your health goals. Please review the following plan we discussed and let me know if I can assist you in the future.   Referrals/Orders/Follow-Ups/Clinician Recommendations: none  This is a list of the screening recommended for you and due dates:  Health Maintenance  Topic Date Due   Eye exam for diabetics  09/13/2016   Mammogram  12/26/2022   COVID-19 Vaccine (3 - 2024-25 season) 04/14/2023   Zoster (Shingles) Vaccine (1 of 2) 01/14/2024*   DTaP/Tdap/Td vaccine (1 - Tdap) 10/13/2024*   Pneumonia Vaccine (1 of 2 - PCV) 10/13/2024*   Flu Shot  03/13/2024   Hemoglobin A1C  04/15/2024   Yearly kidney function blood test for diabetes  10/13/2024   Yearly kidney health urinalysis for diabetes  10/13/2024   Complete foot exam   10/13/2024   Medicare Annual Wellness Visit  11/17/2024   Cologuard (Stool DNA test)  06/17/2026   DEXA scan (bone density measurement)  Completed   Hepatitis C Screening  Completed   HPV Vaccine  Aged Out  *Topic was postponed. The date shown is not the original due date.    Advanced directives: (Declined) Advance directive discussed with you today. Even though you declined this today, please call our office should you change your mind, and we can give you the proper paperwork for you to fill out.  Next Medicare Annual Wellness Visit scheduled for next year: No, will schedule at a later date  insert Preventive Care attachment Insert FALL PREVENTION attachment if needed

## 2023-11-18 NOTE — Progress Notes (Signed)
 Subjective:   Catherine Baker is a 73 y.o. who presents for a Medicare Wellness preventive visit.  Visit Complete: Virtual I connected with  Asencion Noble on 11/18/23 by a audio enabled telemedicine application and verified that I am speaking with the correct person using two identifiers.  Patient Location: Home  Provider Location: Office/Clinic  I discussed the limitations of evaluation and management by telemedicine. The patient expressed understanding and agreed to proceed.  Vital Signs: Because this visit was a virtual/telehealth visit, some criteria may be missing or patient reported. Any vitals not documented were not able to be obtained and vitals that have been documented are patient reported.  VideoError- Librarian, academic were attempted between this provider and patient, however failed, due to patient having technical difficulties OR patient did not have access to video capability.  We continued and completed visit with audio only.   Persons Participating in Visit: Patient.  AWV Questionnaire: No: Patient Medicare AWV questionnaire was not completed prior to this visit.  Cardiac Risk Factors include: advanced age (>42men, >13 women);diabetes mellitus;dyslipidemia;hypertension     Objective:    Today's Vitals   There is no height or weight on file to calculate BMI.     11/18/2023   11:03 AM 11/01/2023    4:05 PM 09/24/2022   12:07 PM 09/06/2021   12:08 PM 09/14/2018   12:14 AM 07/26/2015    7:46 PM  Advanced Directives  Does Patient Have a Medical Advance Directive? No No No No No No  Would patient like information on creating a medical advance directive?  No - Patient declined  No - Patient declined  No - patient declined information    Current Medications (verified) Outpatient Encounter Medications as of 11/18/2023  Medication Sig   b complex vitamins tablet Take 1 tablet by mouth daily.   diclofenac Sodium (VOLTAREN) 1 % GEL Apply 2  g topically as needed (arthritis knee pain).   ezetimibe (ZETIA) 10 MG tablet Take 1 tablet (10 mg total) by mouth daily.   ibuprofen (ADVIL) 800 MG tablet Take 1 tablet (800 mg total) by mouth every 8 (eight) hours as needed for moderate pain. With food   telmisartan-hydrochlorothiazide (MICARDIS HCT) 80-25 MG tablet Take 1 tablet by mouth daily.   tirzepatide [facility]) 5 MG/0.5ML Pen Inject 5 mg into the skin once a week.   zolpidem (AMBIEN) 5 MG tablet Take 1 tablet (5 mg total) by mouth at bedtime as needed.   Facility-Administered Encounter Medications as of 11/18/2023  Medication   ondansetron (ZOFRAN) 4 MG/5ML solution 4 mg    Allergies (verified) Codeine and Crestor [rosuvastatin calcium]   History: Past Medical History:  Diagnosis Date   Adjustment disorder with mixed anxiety and depressed mood 12/09/2019   Diabetes mellitus without complication (HCC)    Hypertension    Past Surgical History:  Procedure Laterality Date   ABDOMINAL HYSTERECTOMY     TUBAL LIGATION     Family History  Problem Relation Age of Onset   Hypertension Mother    Heart disease Mother    Hyperlipidemia Mother    Hypertension Father    Heart disease Father    Hyperlipidemia Father    Breast cancer Maternal Grandmother    Social History   Socioeconomic History   Marital status: Legally Separated    Spouse name: Not on file   Number of children: 3   Years of education: 14   Highest education level: Not on file  Occupational History   Occupation: Nurse  Tobacco Use   Smoking status: Former    Current packs/day: 0.00    Average packs/day: 2.0 packs/day for 11.0 years (22.0 ttl pk-yrs)    Types: Cigarettes    Start date: 36    Quit date: 1990    Years since quitting: 35.2   Smokeless tobacco: Never  Vaping Use   Vaping status: Never Used  Substance and Sexual Activity   Alcohol use: Yes    Comment: occasional   Drug use: No   Sexual activity: Not on file  Other Topics Concern    Not on file  Social History Narrative   Fun: Dance, read, walk, music.    Denies any religious beliefs effecting healthcare.    Social Drivers of Corporate investment banker Strain: Low Risk  (11/18/2023)   Overall Financial Resource Strain (CARDIA)    Difficulty of Paying Living Expenses: Not hard at all  Food Insecurity: No Food Insecurity (11/18/2023)   Hunger Vital Sign    Worried About Running Out of Food in the Last Year: Never true    Ran Out of Food in the Last Year: Never true  Transportation Needs: No Transportation Needs (11/18/2023)   PRAPARE - Administrator, Civil Service (Medical): No    Lack of Transportation (Non-Medical): No  Physical Activity: Sufficiently Active (11/18/2023)   Exercise Vital Sign    Days of Exercise per Week: 3 days    Minutes of Exercise per Session: 60 min  Stress: No Stress Concern Present (11/18/2023)   [employer] of Occupational Health - Occupational Stress Questionnaire    Feeling of Stress : Only a little  Social Connections: Moderately Integrated (11/18/2023)   Social Connection and Isolation Panel [NHANES]    Frequency of Communication with Friends and Family: More than three times a week    Frequency of Social Gatherings with Friends and Family: More than three times a week    Attends Religious Services: More than 4 times per year    Active Member of [employer] or Organizations: Yes    Attends Engineer, structural: More than 4 times per year    Marital Status: Divorced    Tobacco Counseling Counseling given: Not Answered    Clinical Intake:  Pre-visit preparation completed: Yes  Pain : No/denies pain     Nutritional Risks: None Diabetes: Yes CBG done?: No Did pt. bring in CBG monitor from home?: No  Lab Results  Component Value Date   HGBA1C 6.4 10/14/2023   HGBA1C 6.3 02/26/2023   HGBA1C 6.4 06/02/2021     How often do you need to have someone help you when you read instructions, pamphlets, or other  written materials from your doctor or pharmacy?: 1 - Never  Interpreter Needed?: No  Information entered by :: NAllen LPN   Activities of Daily Living     11/18/2023   10:59 AM  In your present state of health, do you have any difficulty performing the following activities:  Hearing? 0  Vision? 0  Difficulty concentrating or making decisions? 0  Walking or climbing stairs? 0  Dressing or bathing? 0  Doing errands, shopping? 0  Preparing Food and eating ? N  Using the Toilet? N  In the past six months, have you accidently leaked urine? N  Do you have problems with loss of bowel control? N  Managing your Medications? N  Managing your Finances? N  Housekeeping or managing your Housekeeping?  N    Patient Care Team: Nche, Bonna Gains, NP as PCP - General (Internal Medicine) [facility]  Indicate any recent Medical Services you may have received from other than [facility] providers in the past year (date may be approximate).     Assessment:   This is a routine wellness examination for [place].  Hearing/Vision screen Hearing Screening - Comments:: Denies hearing issues Vision Screening - Comments:: Regular eye exams, [facility]   Goals Addressed             This Visit's Progress    Patient Stated       11/18/2023, get A1C normalized       Depression Screen     11/18/2023   11:04 AM 11/01/2023    4:05 PM 10/14/2023    1:20 PM 02/26/2023    9:25 AM 09/24/2022   12:08 PM 09/06/2021   12:05 PM 06/01/2021   11:32 AM  PHQ 2/9 Scores  PHQ - 2 Score 0 0 0 0 0 0 0  PHQ- 9 Score 0  0 0   0    Fall Risk     11/18/2023   11:04 AM 11/01/2023    4:03 PM 10/14/2023    1:20 PM 09/24/2022   12:07 PM 09/06/2021   12:09 PM  Fall Risk   Falls in the past year? 0 0 0 0 0  Number falls in past yr: 0  0 0 0  Injury with Fall? 0  0 0 0  Risk for fall due to : Medication side effect  No Fall Risks Medication side effect No Fall Risks  Follow up Falls prevention  discussed;Falls evaluation completed  Falls evaluation completed;Falls prevention discussed Falls prevention discussed;Education provided;Falls evaluation completed Falls evaluation completed    MEDICARE RISK AT HOME:  Medicare Risk at Home Any stairs in or around the home?: No If so, are there any without handrails?: No Home free of loose throw rugs in walkways, pet beds, electrical cords, etc?: Yes Adequate lighting in your home to reduce risk of falls?: Yes Life alert?: No Use of a cane, walker or w/c?: No Shower chair or bench in shower?: No Elevated toilet seat or a handicapped toilet?: No  TIMED UP AND GO:  Was the test performed?  No  Cognitive Function: 6CIT completed        11/18/2023   11:04 AM 09/24/2022   12:10 PM  6CIT Screen  What Year? 0 points 0 points  What month? 0 points 0 points  What time? 0 points 0 points  Count back from 20 0 points 0 points  Months in reverse 0 points 2 points  Repeat phrase 0 points 0 points  Total Score 0 points 2 points    Immunizations Immunization History  Administered Date(s) Administered   Influenza-Unspecified 06/10/2023   PFIZER(Purple Top)SARS-COV-2 Vaccination 04/22/2020, 05/13/2020    Screening Tests Health Maintenance  Topic Date Due   OPHTHALMOLOGY EXAM  09/13/2016   MAMMOGRAM  12/26/2022   COVID-19 Vaccine (3 - 2024-25 season) 04/14/2023   Zoster Vaccines- Shingrix (1 of 2) 01/14/2024 (Originally 11/02/2002)   DTaP/Tdap/Td (1 - Tdap) 10/13/2024 (Originally 11/02/1971)   Pneumonia Vaccine 72+ Years old (1 of 2 - PCV) 10/13/2024 (Originally 11/02/1958)   INFLUENZA VACCINE  03/13/2024   HEMOGLOBIN A1C  04/15/2024   Diabetic kidney evaluation - eGFR measurement  10/13/2024   Diabetic kidney evaluation - Urine ACR  10/13/2024   FOOT EXAM  10/13/2024  Medicare Annual Wellness (AWV)  11/17/2024   Fecal DNA (Cologuard)  06/17/2026   DEXA SCAN  Completed   Hepatitis C Screening  Completed   HPV VACCINES  Aged Out     Health Maintenance  Health Maintenance Due  Topic Date Due   OPHTHALMOLOGY EXAM  09/13/2016   MAMMOGRAM  12/26/2022   COVID-19 Vaccine (3 - 2024-25 season) 04/14/2023   Health Maintenance Items Addressed: Declines covid. Will schedule mammogram.  Additional Screening:  Vision Screening: Recommended annual ophthalmology exams for early detection of glaucoma and other disorders of the eye.  Dental Screening: Recommended annual dental exams for proper oral hygiene  Community Resource Referral / Chronic Care Management: CRR required this visit?  No   CCM required this visit?  No     Plan:     I have personally reviewed and noted the following in the patient's chart:   Medical and social history Use of alcohol, tobacco or illicit drugs  Current medications and supplements including opioid prescriptions. Patient is not currently taking opioid prescriptions. Functional ability and status Nutritional status Physical activity Advanced directives List of other physicians Hospitalizations, surgeries, and ER visits in previous 12 months Vitals Screenings to include cognitive, depression, and falls Referrals and appointments  In addition, I have reviewed and discussed with patient certain preventive protocols, quality metrics, and best practice recommendations. A written personalized care plan for preventive services as well as general preventive health recommendations were provided to patient.     Barb Merino, LPN   09/19/2534   After Visit Summary: (MyChart) Due to this being a telephonic visit, the after visit summary with patients personalized plan was offered to patient via MyChart   Notes: Nothing significant to report at this time.

## 2023-11-22 ENCOUNTER — Encounter: Payer: Self-pay | Admitting: Nurse Practitioner

## 2023-11-22 ENCOUNTER — Ambulatory Visit (INDEPENDENT_AMBULATORY_CARE_PROVIDER_SITE_OTHER): Admitting: Nurse Practitioner

## 2023-11-22 VITALS — BP 136/78 | HR 71 | Temp 97.9°F | Ht 67.0 in | Wt 250.8 lb

## 2023-11-22 DIAGNOSIS — E1169 Type 2 diabetes mellitus with other specified complication: Secondary | ICD-10-CM

## 2023-11-22 DIAGNOSIS — E559 Vitamin D deficiency, unspecified: Secondary | ICD-10-CM | POA: Diagnosis not present

## 2023-11-22 DIAGNOSIS — Z7985 Long-term (current) use of injectable non-insulin antidiabetic drugs: Secondary | ICD-10-CM

## 2023-11-22 DIAGNOSIS — E785 Hyperlipidemia, unspecified: Secondary | ICD-10-CM

## 2023-11-22 DIAGNOSIS — I10 Essential (primary) hypertension: Secondary | ICD-10-CM | POA: Diagnosis not present

## 2023-11-22 DIAGNOSIS — Z6839 Body mass index (BMI) 39.0-39.9, adult: Secondary | ICD-10-CM

## 2023-11-22 LAB — BASIC METABOLIC PANEL WITH GFR
BUN: 16 mg/dL (ref 6–23)
CO2: 26 meq/L (ref 19–32)
Calcium: 9.9 mg/dL (ref 8.4–10.5)
Chloride: 100 meq/L (ref 96–112)
Creatinine, Ser: 0.79 mg/dL (ref 0.40–1.20)
GFR: 75.45 mL/min (ref >60.00)
Glucose, Bld: 95 mg/dL (ref 70–99)
Potassium: 4.1 meq/L (ref 3.5–5.1)
Sodium: 136 meq/L (ref 135–145)

## 2023-11-22 LAB — VITAMIN D 25 HYDROXY (VIT D DEFICIENCY, FRACTURES): VITD: 13.93 ng/mL — ABNORMAL LOW (ref 30.00–100.00)

## 2023-11-22 MED ORDER — TIRZEPATIDE 5 MG/0.5ML ~~LOC~~ SOAJ: 5.0000 mg | SUBCUTANEOUS | 1 refills | Status: DC

## 2023-11-22 NOTE — Patient Instructions (Signed)
 Send BP readings via mychart in 1week Maintain current med doses Go to lab

## 2023-11-22 NOTE — Assessment & Plan Note (Signed)
 BP at goal She stopped micardis hct 4days ago due to fatigue and dizziness. No home BP reading. BP Readings from Last 3 Encounters:  11/22/23 136/78  10/14/23 132/78  06/24/23 (!) 140/73    Check BMP Advised to monitor BP daily and send via mychart in 1week

## 2023-11-22 NOTE — Assessment & Plan Note (Signed)
 Reports constipation with mounjaro 5mg  weekly Has made diet modifications with the help of the nutritionist. She also started daily exercise. Lost 10lbs in last 51month.   Maintain mounjaro dose Advised to start miralax 17g daily, 64oz of water daily and high fiber diet. F/up in 3months

## 2023-11-22 NOTE — Progress Notes (Signed)
 Established Patient Visit  Patient: Catherine Baker   DOB: 12/26/1950   73 y.o. Female  MRN: [id number] Visit Date: 11/22/2023  Subjective:    Chief Complaint  Patient presents with   Follow-up   HPI Essential hypertension BP at goal She stopped micardis hct 4days ago due to fatigue and dizziness. No home BP reading. BP Readings from Last 3 Encounters:  11/22/23 136/78  10/14/23 132/78  06/24/23 (!) 140/73    Check BMP Advised to monitor BP daily and send via mychart in 1week  Type 2 diabetes mellitus Reports constipation with mounjaro 5mg  weekly Has made diet modifications with the help of the nutritionist. She also started daily exercise. Lost 10lbs in last 31month.   Maintain mounjaro dose Advised to start miralax 17g daily, 64oz of water daily and high fiber diet. F/up in 3months   Reviewed medical, surgical, and social history today  Medications: Outpatient Medications Prior to Visit  Medication Sig   b complex vitamins tablet Take 1 tablet by mouth daily.   diclofenac Sodium (VOLTAREN) 1 % GEL Apply 2 g topically as needed (arthritis knee pain).   ezetimibe (ZETIA) 10 MG tablet Take 1 tablet (10 mg total) by mouth daily.   ibuprofen (ADVIL) 800 MG tablet Take 1 tablet (800 mg total) by mouth every 8 (eight) hours as needed for moderate pain. With food   telmisartan-hydrochlorothiazide (MICARDIS HCT) 80-25 MG tablet Take 1 tablet by mouth daily.   zolpidem (AMBIEN) 5 MG tablet Take 1 tablet (5 mg total) by mouth at bedtime as needed.   [DISCONTINUED] tirzepatide [facility]) 5 MG/0.5ML Pen Inject 5 mg into the skin once a week.   Facility-Administered Medications Prior to Visit  Medication Dose Route Frequency Provider   ondansetron [facility]) 4 MG/5ML solution 4 mg  4 mg Oral Once Karie Georges, MD   Reviewed past medical and social history.   ROS per HPI above      Objective:  BP 136/78   Pulse 71   Temp 97.9 F (36.6 C) (Temporal)    Ht 5\' 7"  (1.702 m)   Wt 250 lb 12.8 oz (113.8 kg)   SpO2 98%   BMI 39.28 kg/m      Physical Exam Vitals and nursing note reviewed.  Cardiovascular:     Rate and Rhythm: Normal rate.     Pulses: Normal pulses.  Pulmonary:     Effort: Pulmonary effort is normal.  Neurological:     Mental Status: She is alert and oriented to person, place, and time.     No results found for any visits on 11/22/23.    Assessment & Plan:    Problem List Items Addressed This Visit     Essential hypertension - Primary   BP at goal She stopped micardis hct 4days ago due to fatigue and dizziness. No home BP reading. BP Readings from Last 3 Encounters:  11/22/23 136/78  10/14/23 132/78  06/24/23 (!) 140/73    Check BMP Advised to monitor BP daily and send via mychart in 1week      Relevant Medications   tirzepatide (MOUNJARO) 5 MG/0.5ML Pen   Other Relevant Orders   Basic metabolic panel with GFR   Hyperlipidemia associated with type 2 diabetes mellitus (HCC)   Relevant Medications   tirzepatide (MOUNJARO) 5 MG/0.5ML Pen   Morbid obesity (HCC)   Relevant Medications   tirzepatide (MOUNJARO) 5 MG/0.5ML Pen  Type 2 diabetes mellitus (HCC)   Reports constipation with mounjaro 5mg  weekly Has made diet modifications with the help of the nutritionist. She also started daily exercise. Lost 10lbs in last 18month.   Maintain mounjaro dose Advised to start miralax 17g daily, 64oz of water daily and high fiber diet. F/up in 3months      Relevant Medications   tirzepatide [facility]) 5 MG/0.5ML Pen   Other Relevant Orders   Basic metabolic panel with GFR   Other Visit Diagnoses       BMI 39.0-39.9,adult       Relevant Medications   tirzepatide (MOUNJARO) 5 MG/0.5ML Pen   Other Relevant Orders   VITAMIN D 25 Hydroxy (Vit-D Deficiency, Fractures)      Return in about 3 months (around 02/21/2024) for HTN, DM, Hypothyroidism, hyperlipidemia (fasting).     Alysia Penna, NP

## 2023-11-25 ENCOUNTER — Encounter: Payer: Self-pay | Admitting: Nurse Practitioner

## 2023-11-25 MED ORDER — VITAMIN D (ERGOCALCIFEROL) 1.25 MG (50000 UNIT) PO CAPS: 50000.0000 [IU] | ORAL_CAPSULE | ORAL | 0 refills | Status: DC

## 2023-11-25 NOTE — Addendum Note (Signed)
 Addended by: Kathrene Parents L on: 11/25/2023 08:16 AM   Modules accepted: Orders

## 2023-11-28 ENCOUNTER — Ambulatory Visit

## 2023-12-05 ENCOUNTER — Ambulatory Visit

## 2023-12-11 ENCOUNTER — Emergency Department (HOSPITAL_BASED_OUTPATIENT_CLINIC_OR_DEPARTMENT_OTHER)

## 2023-12-11 ENCOUNTER — Emergency Department (HOSPITAL_BASED_OUTPATIENT_CLINIC_OR_DEPARTMENT_OTHER)
Admission: EM | Admit: 2023-12-11 | Discharge: 2023-12-11 | Disposition: A | Discharge status: Home / Self Care | Attending: Emergency Medicine | Admitting: Emergency Medicine

## 2023-12-11 ENCOUNTER — Encounter (HOSPITAL_BASED_OUTPATIENT_CLINIC_OR_DEPARTMENT_OTHER): Payer: Self-pay | Admitting: Emergency Medicine

## 2023-12-11 DIAGNOSIS — M542 Cervicalgia: Secondary | ICD-10-CM | POA: Diagnosis not present

## 2023-12-11 DIAGNOSIS — M546 Pain in thoracic spine: Secondary | ICD-10-CM | POA: Insufficient documentation

## 2023-12-11 DIAGNOSIS — M5030 Other cervical disc degeneration, unspecified cervical region: Secondary | ICD-10-CM

## 2023-12-11 DIAGNOSIS — X500XXA Overexertion from strenuous movement or load, initial encounter: Secondary | ICD-10-CM | POA: Insufficient documentation

## 2023-12-11 DIAGNOSIS — M47812 Spondylosis without myelopathy or radiculopathy, cervical region: Secondary | ICD-10-CM | POA: Diagnosis not present

## 2023-12-11 DIAGNOSIS — S161XXA Strain of muscle, fascia and tendon at neck level, initial encounter: Secondary | ICD-10-CM | POA: Diagnosis not present

## 2023-12-11 DIAGNOSIS — M5031 Other cervical disc degeneration,  high cervical region: Secondary | ICD-10-CM | POA: Diagnosis not present

## 2023-12-11 DIAGNOSIS — T148XXA Other injury of unspecified body region, initial encounter: Secondary | ICD-10-CM

## 2023-12-11 MED ORDER — DEXAMETHASONE SODIUM PHOSPHATE 4 MG/ML IJ SOLN
4.0000 mg | Freq: Once | INTRAMUSCULAR | Status: AC
  Administered 2023-12-11: 4 mg via INTRAMUSCULAR
  Filled 2023-12-11: qty 1

## 2023-12-11 MED ORDER — ACETAMINOPHEN 500 MG PO TABS
1000.0000 mg | ORAL_TABLET | Freq: Once | ORAL | Status: AC
  Administered 2023-12-11: 1000 mg via ORAL
  Filled 2023-12-11: qty 2

## 2023-12-11 MED ORDER — DICLOFENAC SODIUM ER 100 MG PO TB24: 100.0000 mg | ORAL_TABLET | Freq: Every day | ORAL | 0 refills | Status: DC

## 2023-12-11 MED ORDER — LIDOCAINE 5 % EX PTCH: 1.0000 | MEDICATED_PATCH | CUTANEOUS | 0 refills | Status: AC

## 2023-12-11 MED ORDER — KETOROLAC TROMETHAMINE 60 MG/2ML IM SOLN
30.0000 mg | Freq: Once | INTRAMUSCULAR | Status: AC
  Administered 2023-12-11: 30 mg via INTRAMUSCULAR
  Filled 2023-12-11: qty 2

## 2023-12-11 MED ORDER — LIDOCAINE 5 % EX PTCH
2.0000 | MEDICATED_PATCH | CUTANEOUS | Status: DC
  Administered 2023-12-11: 2 via TRANSDERMAL
  Filled 2023-12-11: qty 2

## 2023-12-11 MED ORDER — ONDANSETRON 4 MG PO TBDP
8.0000 mg | ORAL_TABLET | Freq: Once | ORAL | Status: AC
  Administered 2023-12-11: 8 mg via ORAL
  Filled 2023-12-11: qty 2

## 2023-12-11 NOTE — ED Provider Notes (Signed)
 [facility] Provider Note   CSN: [id number] Arrival date & time: 12/11/23  0454     History  Chief Complaint  Patient presents with   Neck Pain   Back Pain    Catherine Baker is a 73 y.o. female.  The history is provided by the patient.  Neck Pain Pain location:  R side Pain severity:  Severe Pain is:  Same all the time Onset quality:  Sudden Duration: days. Timing:  Constant Progression:  Unchanged Chronicity:  New Context: lifting a heavy object   Context comment:  Heavy trash can Relieved by:  Nothing Worsened by:  Nothing Ineffective treatments:  NSAIDs Associated symptoms: no bladder incontinence, no chest pain, no leg pain, no numbness, no visual change, no weakness and no weight loss   Risk factors: no hx of head and neck radiation   Back Pain Associated symptoms: no bladder incontinence, no chest pain, no leg pain, no numbness, no weakness and no weight loss        Home Medications Prior to Admission medications   Medication Sig Start Date End Date Taking? Authorizing Provider  b complex vitamins tablet Take 1 tablet by mouth daily.    [provider]  diclofenac Sodium (VOLTAREN) 1 % GEL Apply 2 g topically as needed (arthritis knee pain). 09/16/23   [provider]  ezetimibe  (ZETIA ) 10 MG tablet Take 1 tablet (10 mg total) by mouth daily. 10/14/23   Nche, Connye Delaine, NP  ibuprofen  (ADVIL ) 800 MG tablet Take 1 tablet (800 mg total) by mouth every 8 (eight) hours as needed for moderate pain. With food 02/26/23   Nche, Connye Delaine, NP  telmisartan -hydrochlorothiazide (MICARDIS  HCT) 80-25 MG tablet Take 1 tablet by mouth daily. 10/14/23   Nche, Connye Delaine, NP  tirzepatide [facility]) 5 MG/0.5ML Pen Inject 5 mg into the skin once a week. 11/22/23   Nche, Connye Delaine, NP  Vitamin D , Ergocalciferol , (DRISDOL ) 1.25 MG (50000 UNIT) CAPS capsule Take 1 capsule (50,000 Units total) by mouth every 7  (seven) days. 11/25/23   Nche, Connye Delaine, NP  zolpidem  (AMBIEN ) 5 MG tablet Take 1 tablet (5 mg total) by mouth at bedtime as needed. 06/24/23   Dohmeier, Raoul Byes, MD      Allergies    Codeine and Crestor  [rosuvastatin  calcium ]    Review of Systems   Review of Systems  Constitutional:  Negative for weight loss.  Cardiovascular:  Negative for chest pain.  Genitourinary:  Negative for bladder incontinence.  Musculoskeletal:  Positive for back pain and neck pain.  Neurological:  Negative for weakness and numbness.    Physical Exam Updated Vital Signs BP (!) 146/62   Pulse 80   Temp 98.3 F (36.8 C) (Oral)   Resp 18   Ht 5\' 7"  (1.702 m)   Wt 113 kg   SpO2 97%   BMI 39.02 kg/m  Physical Exam Vitals and nursing note reviewed.  Constitutional:      General: She is not in acute distress.    Appearance: She is well-developed.  HENT:     Head: Normocephalic and atraumatic.     Nose: Nose normal.  Eyes:     Pupils: Pupils are equal, round, and reactive to light.  Cardiovascular:     Rate and Rhythm: Normal rate and regular rhythm.     Pulses: Normal pulses.     Heart sounds: Normal heart sounds.  Pulmonary:     Effort: Pulmonary  effort is normal. No respiratory distress.     Breath sounds: Normal breath sounds.  Abdominal:     General: Bowel sounds are normal. There is no distension.     Palpations: Abdomen is soft.     Tenderness: There is no abdominal tenderness. There is no guarding or rebound.  Musculoskeletal:        General: Normal range of motion.       Arms:     Cervical back: Normal and neck supple.     Thoracic back: Normal.     Lumbar back: Normal.       Back:  Skin:    General: Skin is warm and dry.     Capillary Refill: Capillary refill takes less than 2 seconds.     Findings: No erythema or rash.  Neurological:     General: No focal deficit present.     Mental Status: She is oriented to person, place, and time.     Deep Tendon Reflexes: Reflexes  normal.  Psychiatric:        Mood and Affect: Mood normal.     ED Results / Procedures / Treatments   Labs (all labs ordered are listed, but only abnormal results are displayed) Labs Reviewed - No data to display  EKG None  Radiology No results found.  Procedures Procedures    Medications Ordered in ED Medications  ketorolac  (TORADOL ) injection 30 mg (has no administration in time range)  lidocaine  (LIDODERM ) 5 % 2 patch (2 patches Transdermal Patch Applied 12/11/23 0541)  dexamethasone (DECADRON) injection 4 mg (has no administration in time range)  acetaminophen  (TYLENOL ) tablet 1,000 mg (1,000 mg Oral Given 12/11/23 0537)  ondansetron  (ZOFRAN -ODT) disintegrating tablet 8 mg (8 mg Oral Given 12/11/23 0540)    ED Course/ Medical Decision Making/ A&P                                 Medical Decision Making Patient with right upper back and neck pain since lifting a heavy trash can.  No associated symptoms   Amount and/or Complexity of Data Reviewed External Data Reviewed: notes.    Details: Previous notes reviewed  Radiology: ordered and independent interpretation performed.    Details: No fracture or dislocation   Risk OTC drugs. Prescription drug management. Risk Details: No tenderness over the spine.  No associated symptoms.  There is pain with palpation of the are and the symptoms are consistent with muscle strain.  I will start NSAID therapy and lidoderm .  I have advised heat therapy in the form of thermacare wraps.  I have provided a work note for 2 days.  Stable for discharge with close follow up.  Strict returns.      Final Clinical Impression(s) / ED Diagnoses Final diagnoses:  None   No signs of systemic illness or infection. The patient is nontoxic-appearing on exam and vital signs are within normal limits.  I have reviewed the triage vital signs and the nursing notes. Pertinent labs & imaging results that were available during my care of the patient were  reviewed by me and considered in my medical decision making (see chart for details). After history, exam, and medical workup I feel the patient has been appropriately medically screened and is safe for discharge home. Pertinent diagnoses were discussed with the patient. Patient was given return precautions.  Rx / DC Orders ED Discharge Orders     None  Terriona Horlacher, MD 12/11/23 [phone]

## 2023-12-11 NOTE — ED Triage Notes (Signed)
 Right side pain from neck to hip. States feels like stabbing pain in neck. Since Thursday when she lifted some trash. Taking 800 mg Ibuprofen  at home with relief until today.

## 2023-12-12 ENCOUNTER — Ambulatory Visit

## 2023-12-12 ENCOUNTER — Encounter: Attending: Nurse Practitioner | Admitting: Dietician

## 2023-12-12 ENCOUNTER — Telehealth: Payer: Self-pay

## 2023-12-12 DIAGNOSIS — E1165 Type 2 diabetes mellitus with hyperglycemia: Secondary | ICD-10-CM | POA: Diagnosis not present

## 2023-12-12 NOTE — Patient Instructions (Addendum)
 Plan your food the day before. Continue to stay active as you are able. Small meals and snacks with protein. Low fat diet.  Continue the great choices that you are making!  Protein choices you wish to add more frequently: [place] yogurt Cottage cheese

## 2023-12-12 NOTE — Transitions of Care (Post Inpatient/ED Visit) (Signed)
   12/12/2023  Name: Catherine Baker MRN: [id number] DOB: April 05, 1951  Today's TOC FU Call Status: Today's TOC FU Call Status:: Successful TOC FU Call Completed TOC FU Call Complete Date: 12/12/23 Patient's Name and Date of Birth confirmed.  Transition Care Management Follow-up Telephone Call Date of Discharge: 12/11/23 Discharge Facility: [facility] Type of Discharge: Emergency Department Reason for ED Visit: Other: How have you been since you were released from the hospital?: Better Any questions or concerns?: No  Items Reviewed: Did you receive and understand the discharge instructions provided?: Yes Medications obtained,verified, and reconciled?: Yes (Medications Reviewed) Any new allergies since your discharge?: No Dietary orders reviewed?: NA Do you have support at home?: Yes People in Home [RPT]: child(ren), adult  Medications Reviewed Today: Medications Reviewed Today     Reviewed by Darrall Ellison, LPN (Licensed Practical Nurse) on 12/12/23 at 1323  Med List Status: <None>   Medication Order Taking? Sig Documenting Provider Last Dose Status Informant  b complex vitamins tablet [id number] No Take 1 tablet by mouth daily. [provider] Taking Active   diclofenac  Sodium (VOLTAREN ) 1 % GEL [id number] No Apply 2 g topically as needed (arthritis knee pain). [provider] Taking Active   Diclofenac  Sodium CR 100 MG 24 hr tablet [id number]  Take 1 tablet (100 mg total) by mouth daily. Palumbo, April, MD  Active   ezetimibe  (ZETIA ) 10 MG tablet [id number] No Take 1 tablet (10 mg total) by mouth daily. Nche, Connye Delaine, NP Taking Active   ibuprofen  (ADVIL ) 800 MG tablet [id number] No Take 1 tablet (800 mg total) by mouth every 8 (eight) hours as needed for moderate pain. With food Nche, Connye Delaine, NP Taking Active   lidocaine  (LIDODERM ) 5 % [id number]  Place 1 patch onto the skin daily. Remove & Discard patch within 12 hours or as directed by MD  Maralee Senate, April, MD  Active   ondansetron  (ZOFRAN ) 4 MG/5ML solution 4 mg [id number]   Aida House, MD  Active   telmisartan -hydrochlorothiazide (MICARDIS  HCT) 80-25 MG tablet [id number] No Take 1 tablet by mouth daily. Kandace Organ, NP Taking Active   tirzepatide (MOUNJARO) 5 MG/0.5ML Pen [id number]  Inject 5 mg into the skin once a week. Nche, Connye Delaine, NP  Active   Vitamin D , Ergocalciferol , (DRISDOL ) 1.25 MG (50000 UNIT) CAPS capsule [id number]  Take 1 capsule (50,000 Units total) by mouth every 7 (seven) days. Nche, Connye Delaine, NP  Active   zolpidem  (AMBIEN ) 5 MG tablet [id number] No Take 1 tablet (5 mg total) by mouth at bedtime as needed. Dohmeier, Raoul Byes, MD Taking Active             Home Care and Equipment/Supplies: Were Home Health Services Ordered?: NA Any new equipment or medical supplies ordered?: NA  Functional Questionnaire: Do you need assistance with bathing/showering or dressing?: No Do you need assistance with meal preparation?: No Do you need assistance with eating?: No Do you have difficulty maintaining continence: No Do you need assistance with getting out of bed/getting out of a chair/moving?: No Do you have difficulty managing or taking your medications?: No  Follow up appointments reviewed: PCP Follow-up appointment confirmed?: NA Specialist Hospital Follow-up appointment confirmed?: NA Do you need transportation to your follow-up appointment?: No Do you understand care options if your condition(s) worsen?: Yes-patient verbalized understanding    SIGNATURE Darrall Ellison, LPN [facility] Nurse Health Advisor Direct Dial [phone]

## 2023-12-13 NOTE — Progress Notes (Signed)
 Patient was seen on 12/12/2023 for the third of a series of three diabetes self-management courses at [facility]. Time of visit 1450-1710  State the amount of activity recommended for healthy living Describe activities suitable for individual needs Identify ways to regularly incorporate activity into daily life Identify barriers to activity and ways to over come these barriers Identify diabetes medications being personally used and their primary action for lowering glucose and possible side effects Describe role of stress on blood glucose and develop strategies to address psychosocial issues Identify diabetes complications and ways to prevent them Explain how to manage diabetes during illness Evaluate success in meeting personal goal Establish 2-3 goals that they will plan to diligently work on  This was a 1:1 visit due to no other attendees. Referral:  Obesity, Type 2 Diabetes, HLD, HTN  Body Composition Scale 12/13/23  Current Body Weight 236.6  Total Body Fat % 45  Visceral Fat 17  Fat-Free Mass % 54.5   Total Body Water % 41.7  Muscle-Mass lbs 30  BMI 36.8  Body Fat Displacement          Torso  lbs 66.5         Left Leg  lbs 13.3         Right Leg  lbs 13.3         Left Arm  lbs 6.6         Right Arm   lbs 6.6   249 lbs 12/11/2023  255 lbs 11/01/2023 Significant weight loss in the past 2 days - probably fluid loss - discussed with patient that she needs to be sure to drink adequate amounts of water,  16 oz water given during appointment today.  She reports neck and back pain.  Was doing water aerobics 3 days per week plus walking prior to this injury.  She had a steroid injection yesterday. She is testing her blood glucose several times per week.  Fasting was 97 and increased to 117 (secondary to steroid shot?) Patient states that she is very stressed about having diabetes and does not know what to eat and is afraid to eat at times due to concerns  of her blood glucose or side effects. She has a poor appetite and increased fullness and does not meet her minimal nutrition needs at times.  She states that she has cut out bread and sugar (will occasionally eat bread).  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  States that she struggles with giving her own advice.   Plan:  Attend Support Group as desired - She has been added to the support group email list. Increase the protein in your diet.

## 2024-01-07 ENCOUNTER — Other Ambulatory Visit: Payer: Self-pay | Admitting: Neurology

## 2024-01-07 DIAGNOSIS — G4701 Insomnia due to medical condition: Secondary | ICD-10-CM

## 2024-01-13 ENCOUNTER — Encounter: Payer: Self-pay | Admitting: Neurology

## 2024-01-13 NOTE — Telephone Encounter (Signed)
 Pt called wanting to know when this will be called in for her. She states that she only has one pill left now. Please advise.

## 2024-01-28 ENCOUNTER — Telehealth

## 2024-01-28 DIAGNOSIS — J208 Acute bronchitis due to other specified organisms: Secondary | ICD-10-CM | POA: Diagnosis not present

## 2024-01-28 DIAGNOSIS — B9689 Other specified bacterial agents as the cause of diseases classified elsewhere: Secondary | ICD-10-CM

## 2024-01-28 MED ORDER — ALBUTEROL SULFATE HFA 108 (90 BASE) MCG/ACT IN AERS: 2.0000 | INHALATION_SPRAY | Freq: Four times a day (QID) | RESPIRATORY_TRACT | 0 refills | Status: DC | PRN

## 2024-01-28 MED ORDER — DOXYCYCLINE HYCLATE 100 MG PO TABS
100.0000 mg | ORAL_TABLET | Freq: Two times a day (BID) | ORAL | 0 refills | Status: DC
Start: 2024-01-28 — End: 2024-04-08

## 2024-01-28 MED ORDER — BENZONATATE 100 MG PO CAPS: 100.0000 mg | ORAL_CAPSULE | Freq: Three times a day (TID) | ORAL | 0 refills | Status: DC | PRN

## 2024-01-28 NOTE — Patient Instructions (Signed)
 Lawanna Precise, thank you for joining Hyla Maillard, PA-C for today's virtual visit.  While this provider is not your primary care provider (PCP), if your PCP is located in our provider database this encounter information will be shared with them immediately following your visit.   A [facility] MyChart account gives you access to today's visit and all your visits, tests, and labs performed at [facility]  click here if you don't have a [facility] MyChart account or go to [email or web address]  Consent: (Patient) Lawanna Precise provided verbal consent for this virtual visit at the beginning of the encounter.  Current Medications:  Current Outpatient Medications:    albuterol  (VENTOLIN  HFA) 108 (90 Base) MCG/ACT inhaler, Inhale 2 puffs into the lungs every 6 (six) hours as needed for wheezing or shortness of breath., Disp: 8 g, Rfl: 0   benzonatate  (TESSALON ) 100 MG capsule, Take 1 capsule (100 mg total) by mouth 3 (three) times daily as needed for cough., Disp: 30 capsule, Rfl: 0   doxycycline  (VIBRA -TABS) 100 MG tablet, Take 1 tablet (100 mg total) by mouth 2 (two) times daily., Disp: 14 tablet, Rfl: 0   b complex vitamins tablet, Take 1 tablet by mouth daily., Disp: , Rfl:    diclofenac  Sodium (VOLTAREN ) 1 % GEL, Apply 2 g topically as needed (arthritis knee pain)., Disp: , Rfl:    Diclofenac  Sodium CR 100 MG 24 hr tablet, Take 1 tablet (100 mg total) by mouth daily., Disp: 10 tablet, Rfl: 0   ezetimibe  (ZETIA ) 10 MG tablet, Take 1 tablet (10 mg total) by mouth daily., Disp: 90 tablet, Rfl: 1   ibuprofen  (ADVIL ) 800 MG tablet, Take 1 tablet (800 mg total) by mouth every 8 (eight) hours as needed for moderate pain. With food, Disp: 30 tablet, Rfl: 0   lidocaine  (LIDODERM ) 5 %, Place 1 patch onto the skin daily. Remove & Discard patch within 12 hours or as directed by MD, Disp: 30 patch, Rfl: 0   Omega-3 Fatty Acids (FISH OIL) 645 MG CAPS, Take by mouth., Disp: , Rfl:     telmisartan -hydrochlorothiazide (MICARDIS  HCT) 80-25 MG tablet, Take 1 tablet by mouth daily., Disp: 90 tablet, Rfl: 1   tirzepatide  (MOUNJARO ) 5 MG/0.5ML Pen, Inject 5 mg into the skin once a week., Disp: 6 mL, Rfl: 1   Vitamin D , Ergocalciferol , (DRISDOL ) 1.25 MG (50000 UNIT) CAPS capsule, Take 1 capsule (50,000 Units total) by mouth every 7 (seven) days., Disp: 12 capsule, Rfl: 0   zolpidem  (AMBIEN ) 5 MG tablet, TAKE 1 TABLET BY MOUTH AT BEDTIME AS NEEDED, Disp: 30 tablet, Rfl: 0  Current Facility-Administered Medications:    ondansetron  (ZOFRAN ) 4 MG/5ML solution 4 mg, 4 mg, Oral, Once, Aida House, MD   Medications ordered in this encounter:  Meds ordered this encounter  Medications   benzonatate  (TESSALON ) 100 MG capsule    Sig: Take 1 capsule (100 mg total) by mouth 3 (three) times daily as needed for cough.    Dispense:  30 capsule    Refill:  0    Supervising Provider:   LAMPTEY, PHILIP O [[id number]]   albuterol  (VENTOLIN  HFA) 108 (90 Base) MCG/ACT inhaler    Sig: Inhale 2 puffs into the lungs every 6 (six) hours as needed for wheezing or shortness of breath.    Dispense:  8 g    Refill:  0    Supervising Provider:   Corine Dice [id number]   doxycycline  (VIBRA -TABS) 100 MG tablet  Sig: Take 1 tablet (100 mg total) by mouth 2 (two) times daily.    Dispense:  14 tablet    Refill:  0    Supervising Provider:   Corine Dice [[id number]]     *If you need refills on other medications prior to your next appointment, please contact your pharmacy*  Follow-Up: Call back or seek an in-person evaluation if the symptoms worsen or if the condition fails to improve as anticipated.  [facility] [phone]  Other Instructions Take antibiotic (Doxycycline ) as directed.  Increase fluids.  Get plenty of rest. Use Mucinex for congestion. Use the Tessalon  and Albuterol  as directed. Take a daily probiotic (I recommend Align or Culturelle, but even Activia  Yogurt may be beneficial).  A humidifier placed in the bedroom may offer some relief for a dry, scratchy throat of nasal irritation.  Read information below on acute bronchitis. Please call or return to clinic if symptoms are not improving.  Acute Bronchitis Bronchitis is when the airways that extend from the windpipe into the lungs get red, puffy, and painful (inflamed). Bronchitis often causes thick spit (mucus) to develop. This leads to a cough. A cough is the most common symptom of bronchitis. In acute bronchitis, the condition usually begins suddenly and goes away over time (usually in 2 weeks). Smoking, allergies, and asthma can make bronchitis worse. Repeated episodes of bronchitis may cause more lung problems.  HOME CARE Rest. Drink enough fluids to keep your pee (urine) clear or pale yellow (unless you need to limit fluids as told by your doctor). Only take over-the-counter or prescription medicines as told by your doctor. Avoid smoking and secondhand smoke. These can make bronchitis worse. If you are a smoker, think about using nicotine gum or skin patches. Quitting smoking will help your lungs heal faster. Reduce the chance of getting bronchitis again by: Washing your hands often. Avoiding people with cold symptoms. Trying not to touch your hands to your mouth, nose, or eyes. Follow up with your doctor as told.  GET HELP IF: Your symptoms do not improve after 1 week of treatment. Symptoms include: Cough. Fever. Coughing up thick spit. Body aches. Chest congestion. Chills. Shortness of breath. Sore throat.  GET HELP RIGHT AWAY IF:  You have an increased fever. You have chills. You have severe shortness of breath. You have bloody thick spit (sputum). You throw up (vomit) often. You lose too much body fluid (dehydration). You have a severe headache. You faint.  MAKE SURE YOU:  Understand these instructions. Will watch your condition. Will get help right away if you  are not doing well or get worse. Document Released: 01/16/2008 Document Revised: 04/01/2013 Document Reviewed: 01/20/2013 [facility] Patient Information 2015 [place], [place]. This information is not intended to replace advice given to you by your health care provider. Make sure you discuss any questions you have with your health care provider.    If you have been instructed to have an in-person evaluation today at a local Urgent Care facility, please use the link below. It will take you to a list of all of our available [facility], including address, phone number and hours of operation. Please do not delay care.  [facility]  If you or a family member do not have a primary care provider, use the link below to schedule a visit and establish care. When you choose a  primary care physician or advanced practice provider, you gain a long-term  partner in health. Find a Primary Care Provider  Learn more about [facility]'s in-office and virtual care options: [facility] - Get Care Now

## 2024-01-28 NOTE — Progress Notes (Signed)
 Virtual Visit Consent   Catherine Baker, you are scheduled for a virtual visit with a [facility] provider today. Just as with appointments in the office, your consent must be obtained to participate. Your consent will be active for this visit and any virtual visit you may have with one of our providers in the next 365 days. If you have a MyChart account, a copy of this consent can be sent to you electronically.  As this is a virtual visit, video technology does not allow for your provider to perform a traditional examination. This may limit your provider's ability to fully assess your condition. If your provider identifies any concerns that need to be evaluated in person or the need to arrange testing (such as labs, EKG, etc.), we will make arrangements to do so. Although advances in technology are sophisticated, we cannot ensure that it will always work on either your end or our end. If the connection with a video visit is poor, the visit may have to be switched to a telephone visit. With either a video or telephone visit, we are not always able to ensure that we have a secure connection.  By engaging in this virtual visit, you consent to the provision of healthcare and authorize for your insurance to be billed (if applicable) for the services provided during this visit. Depending on your insurance coverage, you may receive a charge related to this service.  I need to obtain your verbal consent now. Are you willing to proceed with your visit today? Catherine Baker has provided verbal consent on 01/28/2024 for a virtual visit (video or telephone). Hyla Maillard, [place]  Date: 01/28/2024 12:06 PM   Virtual Visit via Video Note   I, Hyla Maillard, connected with  Catherine Baker  ([id number], 07-Jul-1951) on 01/28/24 at 12:00 PM EDT by a video-enabled telemedicine application and verified that I am speaking with the correct person using two identifiers.  Location: Patient: Virtual Visit  Location Patient: Home Provider: Virtual Visit Location Provider: Home Office   I discussed the limitations of evaluation and management by telemedicine and the availability of in person appointments. The patient expressed understanding and agreed to proceed.    History of Present Illness: Catherine Baker is a 73 y.o. who identifies as a female who was assigned female at birth, and is being seen today for 3 weeks of cough and congestion starting with a sore throat lasting 4 days or so. The sore throat resolved, but other symptoms continued to persist/progress with increased chest congestion and continued cough that is sometimes dry but other times productive of thick, yellow phlegm. Notes voice hoarseness and chest tightness without chest pain or SOB. Notes intermittent, low-grade fever.  OTC -- Dayquil, Mucinex, lozenges.  HPI: HPI  Problems:  Patient Active Problem List   Diagnosis Date Noted   Morbid obesity (HCC) 10/14/2023   Statin myopathy 10/14/2023   Acute exacerbation of chronic low back pain 02/26/2023   OSA on CPAP 11/01/2022   Mild episode of recurrent major depressive disorder (HCC) 11/01/2022   OSA (obstructive sleep apnea) 07/27/2021   Asymptomatic age-related postmenopausal state 05/01/2019   Osteoarthritis of right knee 01/19/2019   Tear of medial meniscus of knee 01/19/2019   Hyperlipidemia associated with type 2 diabetes mellitus (HCC) 10/03/2018   DDD (degenerative disc disease), lumbar 03/28/2016   Essential hypertension 10/22/2014   Hypothyroidism 10/22/2014   Insomnia 10/22/2014   Type 2 diabetes mellitus (HCC) 10/22/2014  Allergies:  Allergies  Allergen Reactions   Codeine Hives, Nausea Only and Nausea And Vomiting   Crestor  [Rosuvastatin  Calcium ] Other (See Comments)    Myalgia and headache   Medications:  Current Outpatient Medications:    albuterol  (VENTOLIN  HFA) 108 (90 Base) MCG/ACT inhaler, Inhale 2 puffs into the lungs every 6 (six) hours as  needed for wheezing or shortness of breath., Disp: 8 g, Rfl: 0   benzonatate  (TESSALON ) 100 MG capsule, Take 1 capsule (100 mg total) by mouth 3 (three) times daily as needed for cough., Disp: 30 capsule, Rfl: 0   doxycycline  (VIBRA -TABS) 100 MG tablet, Take 1 tablet (100 mg total) by mouth 2 (two) times daily., Disp: 14 tablet, Rfl: 0   b complex vitamins tablet, Take 1 tablet by mouth daily., Disp: , Rfl:    diclofenac  Sodium (VOLTAREN ) 1 % GEL, Apply 2 g topically as needed (arthritis knee pain)., Disp: , Rfl:    Diclofenac  Sodium CR 100 MG 24 hr tablet, Take 1 tablet (100 mg total) by mouth daily., Disp: 10 tablet, Rfl: 0   ezetimibe  (ZETIA ) 10 MG tablet, Take 1 tablet (10 mg total) by mouth daily., Disp: 90 tablet, Rfl: 1   ibuprofen  (ADVIL ) 800 MG tablet, Take 1 tablet (800 mg total) by mouth every 8 (eight) hours as needed for moderate pain. With food, Disp: 30 tablet, Rfl: 0   lidocaine  (LIDODERM ) 5 %, Place 1 patch onto the skin daily. Remove & Discard patch within 12 hours or as directed by MD, Disp: 30 patch, Rfl: 0   Omega-3 Fatty Acids (FISH OIL) 645 MG CAPS, Take by mouth., Disp: , Rfl:    telmisartan -hydrochlorothiazide (MICARDIS  HCT) 80-25 MG tablet, Take 1 tablet by mouth daily., Disp: 90 tablet, Rfl: 1   tirzepatide  (MOUNJARO ) 5 MG/0.5ML Pen, Inject 5 mg into the skin once a week., Disp: 6 mL, Rfl: 1   Vitamin D , Ergocalciferol , (DRISDOL ) 1.25 MG (50000 UNIT) CAPS capsule, Take 1 capsule (50,000 Units total) by mouth every 7 (seven) days., Disp: 12 capsule, Rfl: 0   zolpidem  (AMBIEN ) 5 MG tablet, TAKE 1 TABLET BY MOUTH AT BEDTIME AS NEEDED, Disp: 30 tablet, Rfl: 0  Current Facility-Administered Medications:    ondansetron  (ZOFRAN ) 4 MG/5ML solution 4 mg, 4 mg, Oral, Once, Aida House, MD  Observations/Objective: Patient is well-developed, well-nourished in no acute distress.  Resting comfortably at home.  Head is normocephalic, atraumatic.  No labored breathing. Speech  is clear and coherent with logical content.  Patient is alert and oriented at baseline.   Assessment and Plan: 1. Acute bacterial bronchitis (Primary) - benzonatate  (TESSALON ) 100 MG capsule; Take 1 capsule (100 mg total) by mouth 3 (three) times daily as needed for cough.  Dispense: 30 capsule; Refill: 0 - albuterol  (VENTOLIN  HFA) 108 (90 Base) MCG/ACT inhaler; Inhale 2 puffs into the lungs every 6 (six) hours as needed for wheezing or shortness of breath.  Dispense: 8 g; Refill: 0 - doxycycline  (VIBRA -TABS) 100 MG tablet; Take 1 tablet (100 mg total) by mouth 2 (two) times daily.  Dispense: 14 tablet; Refill: 0  Rx Doxycycline .  Increase fluids.  Rest.  Saline nasal spray.  Probiotic.  Mucinex as directed.  Humidifier in bedroom. Tessalon  and albuterol  per orders.  Call or return to clinic if symptoms are not improving.   Follow Up Instructions: I discussed the assessment and treatment plan with the patient. The patient was provided an opportunity to ask questions and all were answered. The patient  agreed with the plan and demonstrated an understanding of the instructions.  A copy of instructions were sent to the patient via MyChart unless otherwise noted below.   The patient was advised to call back or seek an in-person evaluation if the symptoms worsen or if the condition fails to improve as anticipated.    Hyla Maillard, PA-C

## 2024-02-02 ENCOUNTER — Telehealth: Payer: Self-pay | Admitting: Neurology

## 2024-02-02 DIAGNOSIS — G4701 Insomnia due to medical condition: Secondary | ICD-10-CM

## 2024-02-05 ENCOUNTER — Other Ambulatory Visit: Payer: Self-pay

## 2024-02-05 DIAGNOSIS — G4701 Insomnia due to medical condition: Secondary | ICD-10-CM

## 2024-02-05 NOTE — Telephone Encounter (Signed)
 Pt called to request refill Pt states she will  be going out of town Friday and really need medication before then. zolpidem  (AMBIEN ) 5 MG tablet   Pt would like medication sent to  [facility] [address] [place], [place] - [address] (Ph: [phone])

## 2024-02-05 NOTE — Telephone Encounter (Signed)
 Refill request sent to work-in provider for approval.

## 2024-02-06 NOTE — Telephone Encounter (Signed)
 Pt has called for an update re: the status of this refill request

## 2024-03-10 ENCOUNTER — Other Ambulatory Visit: Payer: Self-pay | Admitting: Neurology

## 2024-03-10 DIAGNOSIS — G4701 Insomnia due to medical condition: Secondary | ICD-10-CM

## 2024-03-16 ENCOUNTER — Encounter: Payer: Self-pay | Admitting: Dietician

## 2024-03-16 ENCOUNTER — Encounter: Attending: Nurse Practitioner | Admitting: Dietician

## 2024-03-16 DIAGNOSIS — E1165 Type 2 diabetes mellitus with hyperglycemia: Secondary | ICD-10-CM | POA: Insufficient documentation

## 2024-03-16 NOTE — Patient Instructions (Addendum)
 Incorporate more vegetables  Cabbage soup  Turnip greens  Add spinach to your sandwich  With humus or ranch made with plain greek yogurt/cottage cheese Continued mindful meal choices Going back to water aerobics or weights or chair yoga or foot ecliptical/peddler  Consider getting your vitamin D  checked.

## 2024-03-16 NOTE — Progress Notes (Signed)
 Appointment start:  1615 Appointment End:  1700  Patient attended Diabetes Core Classes 1-3 between 10/31/2023 and 12/12/2023 at [facility]. The purpose of the meeting today is to review information learned during those classes as well as review patient application and goals.   What are one or two positive things that you are doing right now to manage your diabetes?  Lost 40 lbs (without losing muscle), drinking a lot of water, being mindful about her food choices. She is checking her blood glucose every couple of weeks and prn.  States that she has more energy since losing weight and changed her eating habits  What is the hardest part about your diabetes right now, causing you the most concern, or is the most worrisome to you about your diabetes?  Constipation from Mounjaro  - she is now taking Cleans more herbal which has helped.  What questions do you have today?  none  Have you participated in any diabetes support group?  yes  History:  Type 2 Diabetes, HTN A1C:  6.4% 10/14/2023 Medications include:  Mounjaro , Renew Life Cleans More, MVI, B-complex, Omega-3, vitamin D  (stopped when prescription ends) Sleep:  takes Ambien  and uses C-pap - good sleep with these Weight:   214.5 lbs on 03/16/2024 Wt Readings from Last 3 Encounters:  12/11/23 249 lb 1.9 oz (113 kg)  11/22/23 250 lb 12.8 oz (113.8 kg)  11/01/23 255 lb (115.7 kg)   Body Composition Scale Date 12/13/23  03/16/24  Current Body Weight 236.6 214.5  Total Body Fat % 45 42  Visceral Fat 17 14  Fat-Free Mass % 54.5 57   Total Body Water % 41.7 43  Muscle-Mass lbs 30 29.7  BMI 36.8 33.3  Body Fat Displacement           Torso  lbs 66.5 57         Left Leg  lbs 13.3 11.4         Right Leg  lbs 13.3 11.4         Left Arm  lbs 6.6 5.7         Right Arm   lbs 6.6 5.7   Blood Glucose:  Fasting:  97-110  2 hours after starting a meal:  up to 147  Have you had any low blood sugar readings in the past  month?  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 Exercise:  water aerobics 3 times per week but not for the past 2 weeks, arthritis makes walking painful  24 hour diet recall: Breakfast:  Core protein shake Snack:  plain yogurt, granola, raspberries, lemon juice, honey Lunch:  none Snack:  1 protein bar, 2 crackers with cream cheese Dinner:  grilled chicken, 1/2 bun, cherries Snack:  none Beverages:  water, Core protein shake (1-2 daily)   Specific focus but not limited to the following: Review of blood glucose monitoring and interpretation including the recommended target ranges and Hgb A1c.  Review of carbohydrate counting, importance of regularly scheduled meals/snacks, and meal planning to improve quality of diet. Review of the effects of physical activity on glucose levels and long-term glucose control.  Recommended goal of 150 minutes of physical activity/week. Review of patient medications and discussed role of medication on blood glucose and possible side effects. Discussion of strategies to manage stress, psychosocial issues, and other obstacles to diabetes management. Review of short-term complications: hyper- and hypo-glycemia (causes, symptoms, and  treatment options) Review of prevention, detection, and treatment of long-term complications.  Discussion of the role of prolonged elevated glucose levels on body systems.  Continuing Goals: Incorporate more vegetables  Cabbage soup  Turnip greens  Add spinach to your sandwich  With humus or ranch made with plain greek yogurt/cottage cheese Continued mindful meal choices Going back to water aerobics or weights or chair yoga or foot ecliptical/peddler  Consider getting your vitamin D  checked.  Future Follow up:  prn

## 2024-03-20 DIAGNOSIS — M170 Bilateral primary osteoarthritis of knee: Secondary | ICD-10-CM | POA: Diagnosis not present

## 2024-04-04 ENCOUNTER — Other Ambulatory Visit: Payer: Self-pay | Admitting: Nurse Practitioner

## 2024-04-04 DIAGNOSIS — I10 Essential (primary) hypertension: Secondary | ICD-10-CM

## 2024-04-08 ENCOUNTER — Encounter: Payer: Self-pay | Admitting: Nurse Practitioner

## 2024-04-08 ENCOUNTER — Ambulatory Visit (INDEPENDENT_AMBULATORY_CARE_PROVIDER_SITE_OTHER): Admitting: Nurse Practitioner

## 2024-04-08 VITALS — BP 134/78 | HR 79 | Temp 98.1°F | Ht 67.0 in | Wt 212.4 lb

## 2024-04-08 DIAGNOSIS — E559 Vitamin D deficiency, unspecified: Secondary | ICD-10-CM | POA: Diagnosis not present

## 2024-04-08 DIAGNOSIS — I10 Essential (primary) hypertension: Secondary | ICD-10-CM | POA: Diagnosis not present

## 2024-04-08 DIAGNOSIS — E039 Hypothyroidism, unspecified: Secondary | ICD-10-CM | POA: Diagnosis not present

## 2024-04-08 DIAGNOSIS — E785 Hyperlipidemia, unspecified: Secondary | ICD-10-CM | POA: Diagnosis not present

## 2024-04-08 DIAGNOSIS — E6609 Other obesity due to excess calories: Secondary | ICD-10-CM

## 2024-04-08 DIAGNOSIS — Z7985 Long-term (current) use of injectable non-insulin antidiabetic drugs: Secondary | ICD-10-CM | POA: Diagnosis not present

## 2024-04-08 DIAGNOSIS — Z6833 Body mass index (BMI) 33.0-33.9, adult: Secondary | ICD-10-CM

## 2024-04-08 DIAGNOSIS — E66811 Obesity, class 1: Secondary | ICD-10-CM | POA: Diagnosis not present

## 2024-04-08 DIAGNOSIS — E1169 Type 2 diabetes mellitus with other specified complication: Secondary | ICD-10-CM | POA: Diagnosis not present

## 2024-04-08 LAB — COMPREHENSIVE METABOLIC PANEL WITH GFR
ALT: 24 U/L (ref 0–35)
AST: 21 U/L (ref 0–37)
Albumin: 4 g/dL (ref 3.5–5.2)
Alkaline Phosphatase: 86 U/L (ref 39–117)
BUN: 19 mg/dL (ref 6–23)
CO2: 28 meq/L (ref 19–32)
Calcium: 9.8 mg/dL (ref 8.4–10.5)
Chloride: 95 meq/L — ABNORMAL LOW (ref 96–112)
Creatinine, Ser: 0.74 mg/dL (ref 0.40–1.20)
GFR: 81.39 mL/min (ref >60.00)
Glucose, Bld: 131 mg/dL — ABNORMAL HIGH (ref 70–99)
Potassium: 4.1 meq/L (ref 3.5–5.1)
Sodium: 132 meq/L — ABNORMAL LOW (ref 135–145)
Total Bilirubin: 0.5 mg/dL (ref 0.2–1.2)
Total Protein: 7.5 g/dL (ref 6.0–8.3)

## 2024-04-08 LAB — TSH: TSH: 1.21 u[IU]/mL (ref 0.35–5.50)

## 2024-04-08 LAB — LIPID PANEL
Cholesterol: 179 mg/dL (ref 0–200)
HDL: 57.4 mg/dL (ref >39.00)
LDL Cholesterol: 98 mg/dL (ref 0–99)
NonHDL: 121.37
Total CHOL/HDL Ratio: 3
Triglycerides: 119 mg/dL (ref 0.0–149.0)
VLDL: 23.8 mg/dL (ref 0.0–40.0)

## 2024-04-08 LAB — T4, FREE: Free T4: 1.05 ng/dL (ref 0.60–1.60)

## 2024-04-08 LAB — VITAMIN D 25 HYDROXY (VIT D DEFICIENCY, FRACTURES): VITD: 37.28 ng/mL (ref 30.00–100.00)

## 2024-04-08 LAB — HEMOGLOBIN A1C: Hgb A1c MFr Bld: 6 % (ref 4.6–6.5)

## 2024-04-08 NOTE — Patient Instructions (Signed)
 Go to lab Maintain Heart healthy diet and daily exercise. Maintain current medications.

## 2024-04-08 NOTE — Assessment & Plan Note (Signed)
 38lbs lost with use of mounjaro , diet modification and daily exercise. Wt Readings from Last 3 Encounters:  04/08/24 212 lb 6.4 oz (96.3 kg)  12/11/23 249 lb 1.9 oz (113 kg)  11/22/23 250 lb 12.8 oz (113.8 kg)

## 2024-04-08 NOTE — Assessment & Plan Note (Addendum)
 Unable to tolerate pravastatin , crestor  and atorvastatin  even at low doses. Resolved myalgia  Repeat lipid panel Maintain zetia  dose Consider ref to advance [facility] clinic if no improvement

## 2024-04-08 NOTE — Assessment & Plan Note (Signed)
 BP at goal with micardis /hydrochlorothiazide BP Readings from Last 3 Encounters:  04/08/24 134/78  12/11/23 (!) 146/62  11/22/23 136/78    Check CMP Maintain med dose F/up in 6months

## 2024-04-08 NOTE — Progress Notes (Signed)
 Established Patient Visit  Patient: Catherine Baker   DOB: 1951/02/17   73 y.o. Female  MRN: [id number] Visit Date: 04/08/2024  Subjective:    Chief Complaint  Patient presents with   Follow-up    3 month follow up for DM, HTN, Hyperlipidemia, Hypothyroidism    HPI Essential hypertension BP at goal with micardis /hydrochlorothiazide BP Readings from Last 3 Encounters:  04/08/24 134/78  12/11/23 (!) 146/62  11/22/23 136/78    Check CMP Maintain med dose F/up in 6months  Type 2 diabetes mellitus resolved constipation States effects of mounjaro  wear off by day 5 Has made diet modifications with the help of the nutritionist. Has maintained daily exercise (water aerobics and walking), but had to stop walking due to knee and hip pain) Lost 38lbs with use of mounjaro  and lifestyle modifications UTD with eye exam No neuropathy BP at goal Statin intolerance, current use of zetia   Repeat CMP and hgbA1c Maintain mounjaro  dose: ok to take injection every 5-7days F/up in 6months  Hypothyroidism repeat TSH and T4 Maintain med dose  Hyperlipidemia associated with type 2 diabetes mellitus (HCC) Unable to tolerate pravastatin , crestor  and atorvastatin  even at low doses. Resolved myalgia  Repeat lipid panel Maintain zetia  dose Consider ref to advance lipid clinic if no improvement  Obesity 38lbs lost with use of mounjaro , diet modification and daily exercise. Wt Readings from Last 3 Encounters:  04/08/24 212 lb 6.4 oz (96.3 kg)  12/11/23 249 lb 1.9 oz (113 kg)  11/22/23 250 lb 12.8 oz (113.8 kg)      Reviewed medical, surgical, and social history today  Medications: Outpatient Medications Prior to Visit  Medication Sig   b complex vitamins tablet Take 1 tablet by mouth daily.   diclofenac  Sodium (VOLTAREN ) 1 % GEL Apply 2 g topically as needed (arthritis knee pain).   Diclofenac  Sodium CR 100 MG 24 hr tablet Take 1 tablet (100 mg total) by mouth daily.    ezetimibe  (ZETIA ) 10 MG tablet Take 1 tablet (10 mg total) by mouth daily.   ibuprofen  (ADVIL ) 800 MG tablet Take 1 tablet (800 mg total) by mouth every 8 (eight) hours as needed for moderate pain. With food   lidocaine  (LIDODERM ) 5 % Place 1 patch onto the skin daily. Remove & Discard patch within 12 hours or as directed by MD   Omega-3 Fatty Acids (FISH OIL) 645 MG CAPS Take by mouth.   telmisartan -hydrochlorothiazide (MICARDIS  HCT) 80-25 MG tablet Take 1 tablet by mouth once daily   tirzepatide  (MOUNJARO ) 5 MG/0.5ML Pen Inject 5 mg into the skin once a week.   Vitamin D , Ergocalciferol , (DRISDOL ) 1.25 MG (50000 UNIT) CAPS capsule Take 1 capsule (50,000 Units total) by mouth every 7 (seven) days.   zolpidem  (AMBIEN ) 5 MG tablet TAKE 1 TABLET BY MOUTH AT BEDTIME AS NEEDED   [DISCONTINUED] albuterol  (VENTOLIN  HFA) 108 (90 Base) MCG/ACT inhaler Inhale 2 puffs into the lungs every 6 (six) hours as needed for wheezing or shortness of breath.   [DISCONTINUED] benzonatate  (TESSALON ) 100 MG capsule Take 1 capsule (100 mg total) by mouth 3 (three) times daily as needed for cough.   [DISCONTINUED] doxycycline  (VIBRA -TABS) 100 MG tablet Take 1 tablet (100 mg total) by mouth 2 (two) times daily.   Facility-Administered Medications Prior to Visit  Medication Dose Route Frequency Provider   ondansetron  (ZOFRAN ) 4 MG/5ML solution 4 mg  4 mg Oral Once Ozell,  Heron HERO, MD   Reviewed past medical and social history.   ROS per HPI above      Objective:  BP 134/78 (BP Location: Left Arm, Patient Position: Sitting, Cuff Size: Large)   Pulse 79   Temp 98.1 F (36.7 C) (Oral)   Ht 5' 7 (1.702 m)   Wt 212 lb 6.4 oz (96.3 kg)   SpO2 98%   BMI 33.27 kg/m      Physical Exam Constitutional:      Appearance: She is obese.  Cardiovascular:     Rate and Rhythm: Normal rate and regular rhythm.     Pulses: Normal pulses.     Heart sounds: Murmur heard.     Gallop present.  Pulmonary:     Effort:  Pulmonary effort is normal.     Breath sounds: Normal breath sounds.  Neurological:     Mental Status: She is alert and oriented to person, place, and time.     No results found for any visits on 04/08/24.    Assessment & Plan:    Problem List Items Addressed This Visit     Essential hypertension - Primary   BP at goal with micardis /hydrochlorothiazide BP Readings from Last 3 Encounters:  04/08/24 134/78  12/11/23 (!) 146/62  11/22/23 136/78    Check CMP Maintain med dose F/up in 6months      Relevant Orders   Comprehensive metabolic panel with GFR   Hyperlipidemia associated with type 2 diabetes mellitus (HCC)   Unable to tolerate pravastatin , crestor  and atorvastatin  even at low doses. Resolved myalgia  Repeat lipid panel Maintain zetia  dose Consider ref to advance lipid clinic if no improvement      Relevant Orders   Comprehensive metabolic panel with GFR   Lipid panel   Hypothyroidism   repeat TSH and T4 Maintain med dose      Relevant Orders   TSH   T4, free   Obesity   38lbs lost with use of mounjaro , diet modification and daily exercise. Wt Readings from Last 3 Encounters:  04/08/24 212 lb 6.4 oz (96.3 kg)  12/11/23 249 lb 1.9 oz (113 kg)  11/22/23 250 lb 12.8 oz (113.8 kg)         Type 2 diabetes mellitus (HCC)   resolved constipation States effects of mounjaro  wear off by day 5 Has made diet modifications with the help of the nutritionist. Has maintained daily exercise (water aerobics and walking), but had to stop walking due to knee and hip pain) Lost 38lbs with use of mounjaro  and lifestyle modifications UTD with eye exam No neuropathy BP at goal Statin intolerance, current use of zetia   Repeat CMP and hgbA1c Maintain mounjaro  dose: ok to take injection every 5-7days F/up in 6months      Relevant Orders   Comprehensive metabolic panel with GFR   Hemoglobin A1c   Other Visit Diagnoses       Vitamin D  deficiency       Relevant  Orders   VITAMIN D  25 Hydroxy (Vit-D Deficiency, Fractures)     Long-term (current) use of injectable non-insulin antidiabetic drugs          Return in about 6 months (around 10/13/2024) for CPE (fasting).     Roselie Mood, NP

## 2024-04-08 NOTE — Assessment & Plan Note (Addendum)
 resolved constipation States effects of mounjaro  wear off by day 5 Has made diet modifications with the help of the nutritionist. Has maintained daily exercise (water aerobics and walking), but had to stop walking due to knee and hip pain) Lost 38lbs with use of mounjaro  and lifestyle modifications UTD with eye exam No neuropathy BP at goal Statin intolerance, current use of zetia   Repeat CMP and hgbA1c Maintain mounjaro  dose: ok to take injection every 5-7days F/up in 6months

## 2024-04-08 NOTE — Assessment & Plan Note (Signed)
 repeat TSH and T4 Maintain med dose

## 2024-04-09 ENCOUNTER — Other Ambulatory Visit: Payer: Self-pay | Admitting: Neurology

## 2024-04-09 DIAGNOSIS — G4701 Insomnia due to medical condition: Secondary | ICD-10-CM

## 2024-04-14 NOTE — Telephone Encounter (Signed)
 Refill has been sent to the provider to be sent in to the pharmacy

## 2024-04-14 NOTE — Telephone Encounter (Signed)
 Pt called to follow up on refill of medication the patient  states she is out  of medication     Zolpidem  Tartrate 5 mg Oral At bedtime PRN     [facility] [address], [place] - [address] Phone: [phone]  Fax: [phone]

## 2024-04-15 NOTE — Telephone Encounter (Signed)
 Pt called to follow up on medication request  , Pt states she is completely out of medication and can not function without it       Zolpidem  Tartrate 5 mg Oral At bedtime PRN       [facility] [address] [place], [place] - [address] Phone: [phone]  Fax: [phone]

## 2024-04-16 ENCOUNTER — Ambulatory Visit: Payer: Self-pay | Admitting: Nurse Practitioner

## 2024-04-18 DIAGNOSIS — G4733 Obstructive sleep apnea (adult) (pediatric): Secondary | ICD-10-CM | POA: Diagnosis not present

## 2024-05-02 ENCOUNTER — Other Ambulatory Visit: Payer: Self-pay | Admitting: Nurse Practitioner

## 2024-05-02 DIAGNOSIS — I10 Essential (primary) hypertension: Secondary | ICD-10-CM

## 2024-05-13 ENCOUNTER — Other Ambulatory Visit: Payer: Self-pay | Admitting: Nurse Practitioner

## 2024-05-13 DIAGNOSIS — E1169 Type 2 diabetes mellitus with other specified complication: Secondary | ICD-10-CM

## 2024-05-13 DIAGNOSIS — Z6839 Body mass index (BMI) 39.0-39.9, adult: Secondary | ICD-10-CM

## 2024-05-13 DIAGNOSIS — I10 Essential (primary) hypertension: Secondary | ICD-10-CM

## 2024-05-21 ENCOUNTER — Other Ambulatory Visit: Payer: Self-pay | Admitting: Nurse Practitioner

## 2024-05-21 DIAGNOSIS — E1169 Type 2 diabetes mellitus with other specified complication: Secondary | ICD-10-CM

## 2024-06-01 DIAGNOSIS — M170 Bilateral primary osteoarthritis of knee: Secondary | ICD-10-CM | POA: Diagnosis not present

## 2024-06-06 DIAGNOSIS — Z20822 Contact with and (suspected) exposure to covid-19: Secondary | ICD-10-CM | POA: Diagnosis not present

## 2024-06-06 DIAGNOSIS — J209 Acute bronchitis, unspecified: Secondary | ICD-10-CM | POA: Diagnosis not present

## 2024-06-16 ENCOUNTER — Encounter: Payer: Self-pay | Admitting: Nurse Practitioner

## 2024-06-16 ENCOUNTER — Ambulatory Visit (INDEPENDENT_AMBULATORY_CARE_PROVIDER_SITE_OTHER): Admitting: Nurse Practitioner

## 2024-06-16 VITALS — BP 132/76 | HR 81 | Temp 98.7°F | Ht 67.0 in | Wt 202.2 lb

## 2024-06-16 DIAGNOSIS — I10 Essential (primary) hypertension: Secondary | ICD-10-CM

## 2024-06-16 DIAGNOSIS — R490 Dysphonia: Secondary | ICD-10-CM

## 2024-06-16 DIAGNOSIS — J020 Streptococcal pharyngitis: Secondary | ICD-10-CM

## 2024-06-16 DIAGNOSIS — K5903 Drug induced constipation: Secondary | ICD-10-CM

## 2024-06-16 LAB — POCT RAPID STREP A (OFFICE): Rapid Strep A Screen: POSITIVE — AB

## 2024-06-16 MED ORDER — LEVOCETIRIZINE DIHYDROCHLORIDE 5 MG PO TABS: 5.0000 mg | ORAL_TABLET | Freq: Every evening | ORAL | Status: AC

## 2024-06-16 MED ORDER — OMEPRAZOLE 20 MG PO CPDR: 20.0000 mg | DELAYED_RELEASE_CAPSULE | Freq: Two times a day (BID) | ORAL | 0 refills | Status: AC

## 2024-06-16 MED ORDER — PENICILLIN V POTASSIUM 500 MG PO TABS: 500.0000 mg | ORAL_TABLET | Freq: Three times a day (TID) | ORAL | 0 refills | Status: AC

## 2024-06-16 NOTE — Patient Instructions (Addendum)
 Stop ginger and tumeric tea Maintain adequate oral hydration Start prilosec 20mg  2x/day x 1week Start zrytec 10mg  daily till symptoms resolve. Start penicillin for strep Do not use albuterol  unless you are experiencing wheezing or excessive coughing.

## 2024-06-16 NOTE — Progress Notes (Unsigned)
 Established Patient Visit  Patient: Catherine Baker   DOB: August 26, 1950   73 y.o. Female  MRN: [id number] Visit Date: 06/17/2024  Subjective:    Chief Complaint  Patient presents with   Follow-up    Recent UC visit follow up better still no voice SOB little production of phlegm, little heaviness in chest    Sore Throat  This is a new problem. The current episode started in the past 7 days. The problem has been unchanged. There has been no fever. Associated symptoms include congestion, a hoarse voice and swollen glands. Pertinent negatives include no abdominal pain, coughing, diarrhea, drooling, ear discharge, ear pain, headaches, plugged ear sensation, neck pain, shortness of breath, stridor, trouble swallowing or vomiting. Associated symptoms comments: heartburn. She has had no exposure to strep or mono. She has tried gargles for the symptoms. The treatment provided no relief.  She completed doxycycline  and oral prednisone  on Monday. Resolved cough.  Drug-induced constipation Secondary to GLP-1RA No improvement with miralax Advised to start senokot 1tab daily Encouraged adequate oral hydration and high fiber diet Consider use of linzess or amitiza?  Wt Readings from Last 3 Encounters:  06/16/24 202 lb 3.2 oz (91.7 kg)  04/08/24 212 lb 6.4 oz (96.3 kg)  12/11/23 249 lb 1.9 oz (113 kg)   Reviewed medical, surgical, and social history today  Current Outpatient Medications on File Prior to Visit  Medication Sig Dispense Refill   albuterol  (VENTOLIN  HFA) 108 (90 Base) MCG/ACT inhaler Inhale 1-2 puffs into the lungs every 4 (four) hours as needed for wheezing or shortness of breath.     b complex vitamins tablet Take 1 tablet by mouth daily.     diclofenac  Sodium (VOLTAREN ) 1 % GEL Apply 2 g topically as needed (arthritis knee pain).     Diclofenac  Sodium CR 100 MG 24 hr tablet Take 1 tablet (100 mg total) by mouth daily. 10 tablet 0   ezetimibe  (ZETIA ) 10 MG tablet Take 1  tablet by mouth once daily 90 tablet 1   ibuprofen  (ADVIL ) 800 MG tablet Take 1 tablet (800 mg total) by mouth every 8 (eight) hours as needed for moderate pain. With food 30 tablet 0   lidocaine  (LIDODERM ) 5 % Place 1 patch onto the skin daily. Remove & Discard patch within 12 hours or as directed by MD 30 patch 0   MOUNJARO  5 MG/0.5ML Pen INJECT 5 MG SUBCUTANEOUSLY ONCE A WEEK 12 mL 0   Omega-3 Fatty Acids (FISH OIL) 645 MG CAPS Take by mouth.     Vitamin D , Ergocalciferol , (DRISDOL ) 1.25 MG (50000 UNIT) CAPS capsule Take 1 capsule (50,000 Units total) by mouth every 7 (seven) days. 12 capsule 0   zolpidem  (AMBIEN ) 5 MG tablet TAKE 1 TABLET BY MOUTH AT BEDTIME AS NEEDED 30 tablet 2   telmisartan -hydrochlorothiazide (MICARDIS  HCT) 80-25 MG tablet Take 1 tablet by mouth daily. TAKE 1 TABLET BY MOUTH ONCE DAILY     Current Facility-Administered Medications on File Prior to Visit  Medication Dose Route Frequency Provider Last Rate Last Admin   ondansetron  (ZOFRAN ) 4 MG/5ML solution 4 mg  4 mg Oral Once Ozell Heron HERO, MD         Reviewed past medical and social history.   ROS per HPI above      Objective:  BP 132/76 (BP Location: Left Arm, Patient Position: Sitting, Cuff Size: Large)   Pulse 81  Temp 98.7 F (37.1 C) (Temporal)   Ht 5' 7 (1.702 m)   Wt 202 lb 3.2 oz (91.7 kg)   SpO2 97%   BMI 31.67 kg/m      Physical Exam Vitals and nursing note reviewed.  HENT:     Mouth/Throat:     Pharynx: Uvula midline. Posterior oropharyngeal erythema and postnasal drip present. No pharyngeal swelling or uvula swelling.     Tonsils: No tonsillar exudate or tonsillar abscesses.  Cardiovascular:     Rate and Rhythm: Normal rate.     Pulses: Normal pulses.  Pulmonary:     Effort: Pulmonary effort is normal.     Breath sounds: Normal breath sounds.  Musculoskeletal:     Cervical back: Normal range of motion and neck supple.  Lymphadenopathy:     Cervical: Cervical adenopathy  present.  Neurological:     Mental Status: She is alert and oriented to person, place, and time.     Results for orders placed or performed in visit on 06/16/24  POCT rapid strep A  Result Value Ref Range   Rapid Strep A Screen Positive (A) Negative      Assessment & Plan:    Problem List Items Addressed This Visit     Drug-induced constipation   Secondary to GLP-1RA No improvement with miralax Advised to start senokot 1tab daily Encouraged adequate oral hydration and high fiber diet Consider use of linzess or amitiza?      Essential hypertension   Relevant Medications   telmisartan -hydrochlorothiazide (MICARDIS  HCT) 80-25 MG tablet   Other Visit Diagnoses       Hoarseness    -  Primary   Relevant Medications   levocetirizine (XYZAL) 5 MG tablet   omeprazole (PRILOSEC) 20 MG capsule   Other Relevant Orders   POCT rapid strep A (Completed)     Strep pharyngitis       Relevant Medications   penicillin v potassium (VEETID) 500 MG tablet     Maintain adequate oral hydration Start prilosec 20mg  2x/day x 1week Start zrytec 10mg  daily till symptoms resolve. Start penicillin for strep Do not use albuterol  unless you are experiencing wheezing or excessive coughing.  Return if symptoms worsen or fail to improve.     Roselie Mood, NP

## 2024-06-17 ENCOUNTER — Telehealth: Payer: Self-pay | Admitting: Nurse Practitioner

## 2024-06-17 DIAGNOSIS — Z0279 Encounter for issue of other medical certificate: Secondary | ICD-10-CM

## 2024-06-17 DIAGNOSIS — K5903 Drug induced constipation: Secondary | ICD-10-CM | POA: Insufficient documentation

## 2024-06-17 NOTE — Telephone Encounter (Signed)
 Patient dropped off document FMLA, to be filled out by provider. Patient requested to send it back via Fax within 7-days. Document is located in providers tray at front office.Please advise at Mobile [phone] (mobile)   FMLA fax came through fax. I put in the dr box

## 2024-06-17 NOTE — Assessment & Plan Note (Signed)
 Secondary to GLP-1RA No improvement with miralax Advised to start senokot 1tab daily Encouraged adequate oral hydration and high fiber diet Consider use of linzess or amitiza?

## 2024-06-17 NOTE — Telephone Encounter (Signed)
CLINICAL USE BELOW THIS LINE (use X to signify taken)  __X__Form received and placed in providers office for signature. ____Form completed and faxed to LOA Dept. ____Form completed & LVM to notify pt ready for pick up ____Charge sheet & copy of form in front office folder for office supervisor.   

## 2024-06-18 NOTE — Progress Notes (Signed)
 [facility] [address] [place]. [place] [address] [phone]       OFFICE FOLLOW UP NOTE  [name] Date of Birth:  [birth year] Medical Record Number:  [id number]    Primary neurologist: Dr. Chalice Reason for visit: CPAP follow-up    SUBJECTIVE:   CHIEF COMPLAINT:  Chief Complaint  Patient presents with   Sleep Apnea    Rm 8 alone Pt is well and stable, reports no OSA/CPAP concerns.     Follow-up visit:  Prior visit: 06/24/2023 with Dr. Chalice  Brief HPI:   Catherine Baker is a 73 y.o. female who is followed for OSA on CPAP as well as chronic insomnia.  Diagnosed in 09/2021 after HST showed moderate sleep apnea and started on CPAP therapy.  She also remains on Ambien  to help with chronic insomnia.    Interval history:  Patient returns for yearly follow-up visit.  Reports she received a new machine in 03/2024 as her prior machine was malfunctioning.  She reports nightly usage with at least 6 hours per night. She is unsure why download report says only 13 days with greater than 4 hours. Did obtain download from machine with similar results. She will follow up with DME company regarding this. She reports continued benefit with CPAP therapy and unable to sleep without it. She has lost approximately 50 pounds since 2023 sleep study, currently on Mounjaro .  She also remains on Ambien  nightly which helps with insomnia.  No questions or concerns at this time          ROS:   14 system review of systems performed and negative with exception of those listed in HPI  PMH:  Past Medical History:  Diagnosis Date   Adjustment disorder with mixed anxiety and depressed mood 12/09/2019   Diabetes mellitus without complication (HCC)    Hypertension     PSH:  Past Surgical History:  Procedure Laterality Date   ABDOMINAL HYSTERECTOMY     TUBAL LIGATION      Social History:  Social History   Socioeconomic History   Marital status:  Legally Separated    Spouse name: Not on file   Number of children: 3   Years of education: 14   Highest education level: Not on file  Occupational History   Occupation: Nurse  Tobacco Use   Smoking status: Former    Current packs/day: 0.00    Average packs/day: 2.0 packs/day for 11.0 years (22.0 ttl pk-yrs)    Types: Cigarettes    Start date: 36    Quit date: 1990    Years since quitting: 35.8   Smokeless tobacco: Never  Vaping Use   Vaping status: Never Used  Substance and Sexual Activity   Alcohol use: Yes    Comment: occasional   Drug use: No   Sexual activity: Not on file  Other Topics Concern   Not on file  Social History Narrative   Fun: Dance, read, walk, music.    Denies any religious beliefs effecting healthcare.    Social Drivers of Corporate Investment Banker Strain: Low Risk  (11/18/2023)   Overall Financial Resource Strain (CARDIA)    Difficulty of Paying Living Expenses: Not hard at all  Food Insecurity: No Food Insecurity (11/18/2023)   Hunger Vital Sign    Worried About Running Out of Food in the Last Year: Never true    Ran Out of Food in the Last Year: Never true  Transportation Needs: No Transportation  Needs (11/18/2023)   PRAPARE - Administrator, Civil Service (Medical): No    Lack of Transportation (Non-Medical): No  Physical Activity: Sufficiently Active (11/18/2023)   Exercise Vital Sign    Days of Exercise per Week: 3 days    Minutes of Exercise per Session: 60 min  Stress: No Stress Concern Present (11/18/2023)   [employer] of Occupational Health - Occupational Stress Questionnaire    Feeling of Stress : Only a little  Social Connections: Moderately Integrated (11/18/2023)   Social Connection and Isolation Panel    Frequency of Communication with Friends and Family: More than three times a week    Frequency of Social Gatherings with Friends and Family: More than three times a week    Attends Religious Services: More than 4 times  per year    Active Member of [employer] or Organizations: Yes    Attends Engineer, Structural: More than 4 times per year    Marital Status: Divorced  Intimate Partner Violence: Not At Risk (11/18/2023)   Humiliation, Afraid, Rape, and Kick questionnaire    Fear of Current or Ex-Partner: No    Emotionally Abused: No    Physically Abused: No    Sexually Abused: No    Family History:  Family History  Problem Relation Age of Onset   Hypertension Mother    Heart disease Mother    Hyperlipidemia Mother    Hypertension Father    Heart disease Father    Hyperlipidemia Father    Breast cancer Maternal Grandmother     Medications:   Current Outpatient Medications on File Prior to Visit  Medication Sig Dispense Refill   albuterol  (VENTOLIN  HFA) 108 (90 Base) MCG/ACT inhaler Inhale 1-2 puffs into the lungs every 4 (four) hours as needed for wheezing or shortness of breath.     b complex vitamins tablet Take 1 tablet by mouth daily.     diclofenac  Sodium (VOLTAREN ) 1 % GEL Apply 2 g topically as needed (arthritis knee pain).     levocetirizine (XYZAL) 5 MG tablet Take 1 tablet (5 mg total) by mouth every evening.     lidocaine  (LIDODERM ) 5 % Place 1 patch onto the skin daily. Remove & Discard patch within 12 hours or as directed by MD 30 patch 0   MOUNJARO  5 MG/0.5ML Pen INJECT 5 MG SUBCUTANEOUSLY ONCE A WEEK 12 mL 0   omeprazole (PRILOSEC) 20 MG capsule Take 1 capsule (20 mg total) by mouth 2 (two) times daily before a meal. 30 capsule 0   penicillin v potassium (VEETID) 500 MG tablet Take 1 tablet (500 mg total) by mouth 3 (three) times daily for 10 days. 30 tablet 0   telmisartan -hydrochlorothiazide (MICARDIS  HCT) 80-25 MG tablet Take 1 tablet by mouth daily. TAKE 1 TABLET BY MOUTH ONCE DAILY     zolpidem  (AMBIEN ) 5 MG tablet TAKE 1 TABLET BY MOUTH AT BEDTIME AS NEEDED 30 tablet 2   Current Facility-Administered Medications on File Prior to Visit  Medication Dose Route Frequency  Provider Last Rate Last Admin   ondansetron  (ZOFRAN ) 4 MG/5ML solution 4 mg  4 mg Oral Once Ozell Heron HERO, MD        Allergies:   Allergies  Allergen Reactions   Codeine Hives, Nausea Only and Nausea And Vomiting   Crestor  [Rosuvastatin  Calcium ] Other (See Comments)    Myalgia and headache      OBJECTIVE:  Physical Exam  Vitals:   06/22/24 1456  BP:  139/81  Pulse: 78  Weight: 204 lb (92.5 kg)  Height: 5' 7 (1.702 m)   Body mass index is 31.95 kg/m. No results found.  General: well developed, well nourished, seated, in no evident distress Head: head normocephalic and atraumatic.   Neck: supple with no carotid or supraclavicular bruits Cardiovascular: regular rate and rhythm, no murmurs  Neurologic Exam Mental Status: Awake and fully alert. Oriented to place and time. Recent and remote memory intact. Attention span, concentration and fund of knowledge appropriate. Mood and affect appropriate.  Cranial Nerves: Pupils equal, briskly reactive to light. Extraocular movements full without nystagmus. Visual fields full to confrontation. Hearing intact. Facial sensation intact. Face, tongue, palate moves normally and symmetrically.  Motor: Normal bulk and tone. Normal strength in all tested extremity muscles Gait and Station: Arises from chair without difficulty. Stance is normal. Gait demonstrates normal stride length and balance without use of AD.         ASSESSMENT/PLAN: Catherine Baker is a 73 y.o. year old female    OSA on CPAP :  Compliance report shows satisfactory nightly usage although suboptimal >4 hr usage. She reports usage of at least 6 hrs per night. She was advised to f/u with her DME regarding this discrepancy Continue current pressure settings of 5-12 with EPR 3 Discussed continued nightly usage with ensuring greater than 4 hours nightly for optimal benefit and per insurance purposes.   Continue to follow with DME company for any needed supplies or  CPAP related concerns CPAP set up 03/2024 (received new machine as prior machine malfunctioning)  Insomnia: Continue Ambien  5 mg nightly - refill up to date. Last filled 06/13/2024 for 30 day supply She is aware of potential risks with long-term treatment and wishes to continue     Follow up in 1 year or call earlier if needed   CC:  PCP: Nche, Roselie Rockford, NP    Harlene Bogaert, AGNP-BC  [facility] [address] [place], [place] [address]  Phone [phone] Fax [phone] Note: This document was prepared with digital dictation and possible smart phrase technology. Any transcriptional errors that result from this process are unintentional.

## 2024-06-18 NOTE — Telephone Encounter (Signed)
 CLINICAL USE BELOW THIS LINE (use X to signify taken)  ____Form received and placed in providers office for signature. _X___Form completed and faxed to LOA Dept. _X___Form completed & LVM to notify pt ready for pick up __X__Charge sheet & copy of form in front office folder for office supervisor.

## 2024-06-22 ENCOUNTER — Ambulatory Visit: Payer: PPO | Admitting: Adult Health

## 2024-06-22 ENCOUNTER — Encounter: Payer: Self-pay | Admitting: Adult Health

## 2024-06-22 VITALS — BP 139/81 | HR 78 | Ht 67.0 in | Wt 204.0 lb

## 2024-06-22 DIAGNOSIS — G4733 Obstructive sleep apnea (adult) (pediatric): Secondary | ICD-10-CM | POA: Diagnosis not present

## 2024-06-22 DIAGNOSIS — G4701 Insomnia due to medical condition: Secondary | ICD-10-CM

## 2024-06-24 ENCOUNTER — Telehealth: Payer: Self-pay | Admitting: Nurse Practitioner

## 2024-06-24 NOTE — Telephone Encounter (Signed)
 Patient is needing a letter for work excusing her from the flu shot because she is currently sick. Please advise

## 2024-06-24 NOTE — Telephone Encounter (Signed)
 Copied from CRM [id number]. Topic: Clinical - Medical Advice >> Jun 23, 2024  3:20 PM Dedra B wrote: Reason for CRM: Pt works for [employer] and wants to know if it's safe for her to get the flu vaccine while still being treated for strep. Pls call pt at [phone] >> Jun 23, 2024  5:06 PM China J wrote: The patient's employer is needing a written letter stating why she is unable to get the flu vaccination as of now since she is sick. Please send letter to patient's MyChart and call once completed.

## 2024-06-24 NOTE — Telephone Encounter (Signed)
 Contacted patient to inform her she can get her flu shot. Unable to reach patient LVMTCB, sending MyChart Message

## 2024-07-06 ENCOUNTER — Encounter: Payer: Self-pay | Admitting: Nurse Practitioner

## 2024-07-06 ENCOUNTER — Other Ambulatory Visit: Payer: Self-pay | Admitting: Neurology

## 2024-07-06 DIAGNOSIS — G4701 Insomnia due to medical condition: Secondary | ICD-10-CM

## 2024-07-08 NOTE — Telephone Encounter (Signed)
 Requested Prescriptions   Pending Prescriptions Disp Refills   zolpidem  (AMBIEN ) 5 MG tablet [Pharmacy Med Name: Zolpidem  Tartrate 5 MG Oral Tablet] 30 tablet 0    Sig: TAKE 1 TABLET BY MOUTH AT BEDTIME AS NEEDED   Last seen 06/22/24 Next appt 06/22/25  Dispenses   Dispensed Days Supply Quantity Provider Pharmacy  ZOLPIDEM  5MG         TAB 06/13/2024 30 30 each Dohmeier, Dedra, MD [facility]...  ZOLPIDEM  5MG         TAB 05/13/2024 30 30 each Dohmeier, Dedra, MD [facility]...  ZOLPIDEM  5MG         TAB 04/15/2024 30 30 each Dohmeier, Dedra, MD [facility]...  ZOLPIDEM  5MG         TAB 03/11/2024 30 30 each Dohmeier, Dedra, MD [facility]...  ZOLPIDEM  5MG         TAB 02/11/2024 30 30 each Ines Onetha NOVAK, MD [facility]...  ZOLPIDEM  5MG         TAB 01/14/2024 30 30 each Dohmeier, Dedra, MD [facility]...  ZOLPIDEM  5MG         TAB 12/13/2023 30 30 each Dohmeier, Dedra, MD [facility]...  ZOLPIDEM  5MG         TAB 11/12/2023 30 30 each Dohmeier, Dedra, MD [facility]...  ZOLPIDEM  5MG         TAB 10/13/2023 30 30 each Dohmeier, Dedra, MD [facility]...  ZOLPIDEM  5MG         TAB 09/13/2023 30 30 each Dohmeier, Dedra, MD [facility]...  ZOLPIDEM  5MG         TAB 08/16/2023 30 30 each Dohmeier, Dedra, MD [facility]...  ZOLPIDEM  5MG         TAB 07/20/2023 30 30 each Dohmeier, Dedra, MD [email or web address]

## 2024-07-16 ENCOUNTER — Other Ambulatory Visit: Payer: Self-pay | Admitting: Nurse Practitioner

## 2024-07-16 DIAGNOSIS — Z6839 Body mass index (BMI) 39.0-39.9, adult: Secondary | ICD-10-CM

## 2024-07-16 DIAGNOSIS — E1169 Type 2 diabetes mellitus with other specified complication: Secondary | ICD-10-CM

## 2024-07-16 DIAGNOSIS — I10 Essential (primary) hypertension: Secondary | ICD-10-CM

## 2024-08-09 ENCOUNTER — Other Ambulatory Visit: Payer: Self-pay | Admitting: Neurology

## 2024-08-09 DIAGNOSIS — G4701 Insomnia due to medical condition: Secondary | ICD-10-CM

## 2024-08-10 NOTE — Telephone Encounter (Signed)
 Last OV 06/22/24 Next OV 06/22/25  Last refill 07/08/24 Qty #30/0

## 2024-09-05 ENCOUNTER — Other Ambulatory Visit: Payer: Self-pay | Admitting: Neurology

## 2024-09-05 DIAGNOSIS — G4701 Insomnia due to medical condition: Secondary | ICD-10-CM

## 2025-06-22 ENCOUNTER — Ambulatory Visit: Admitting: Adult Health
